# Patient Record
Sex: Female | Born: 1965
Health system: Southern US, Community
[De-identification: ages and names within clinical notes are randomized; demographics above are authoritative.]

## PROBLEM LIST (undated history)

## (undated) DIAGNOSIS — M2550 Pain in unspecified joint: Secondary | ICD-10-CM

## (undated) DIAGNOSIS — H547 Unspecified visual loss: Secondary | ICD-10-CM

## (undated) DIAGNOSIS — I10 Essential (primary) hypertension: Secondary | ICD-10-CM

## (undated) DIAGNOSIS — H3581 Retinal edema: Secondary | ICD-10-CM

## (undated) DIAGNOSIS — F32A Depression, unspecified: Secondary | ICD-10-CM

## (undated) DIAGNOSIS — F909 Attention-deficit hyperactivity disorder, unspecified type: Secondary | ICD-10-CM

## (undated) DIAGNOSIS — H409 Unspecified glaucoma: Secondary | ICD-10-CM

## (undated) DIAGNOSIS — E11319 Type 2 diabetes mellitus with unspecified diabetic retinopathy without macular edema: Secondary | ICD-10-CM

## (undated) DIAGNOSIS — E119 Type 2 diabetes mellitus without complications: Secondary | ICD-10-CM

## (undated) DIAGNOSIS — F329 Major depressive disorder, single episode, unspecified: Secondary | ICD-10-CM

## (undated) DIAGNOSIS — E785 Hyperlipidemia, unspecified: Secondary | ICD-10-CM

## (undated) HISTORY — DX: Major depressive disorder, single episode, unspecified: F32.9

## (undated) HISTORY — DX: Hyperlipidemia, unspecified: E78.5

## (undated) HISTORY — DX: Unspecified visual loss: H54.7

## (undated) HISTORY — DX: Pain in unspecified joint: M25.50

## (undated) HISTORY — DX: Type 2 diabetes mellitus without complications: E11.9

## (undated) HISTORY — DX: Retinal edema: H35.81

## (undated) HISTORY — DX: Attention-deficit hyperactivity disorder, unspecified type: F90.9

## (undated) HISTORY — PX: CATARACT EXTRACTION: SUR2

## (undated) HISTORY — DX: Essential (primary) hypertension: I10

## (undated) HISTORY — PX: OTHER SURGICAL HISTORY: SHX169

## (undated) HISTORY — DX: Depression, unspecified: F32.A

## (undated) HISTORY — DX: Unspecified glaucoma: H40.9

## (undated) HISTORY — DX: Type 2 diabetes mellitus with unspecified diabetic retinopathy without macular edema: E11.319

---

## 2006-09-27 ENCOUNTER — Inpatient Hospital Stay (HOSPITAL_COMMUNITY): Admission: AD | Admit: 2006-09-27 | Discharge: 2006-09-27 | Payer: Self-pay | Admitting: Obstetrics and Gynecology

## 2006-10-11 ENCOUNTER — Ambulatory Visit: Payer: Self-pay | Admitting: Gynecology

## 2006-10-11 ENCOUNTER — Inpatient Hospital Stay (HOSPITAL_COMMUNITY): Admission: AD | Admit: 2006-10-11 | Discharge: 2006-10-16 | Payer: Self-pay | Admitting: Gynecology

## 2006-10-11 IMAGING — US US OB COMP +14 WK
1 series · 14 of 28 positions shown · non-contrast
Comparison: none

OBSTETRICAL ULTRASOUND:

 This ultrasound exam was performed in the [HOSPITAL] Ultrasound Department.  The OB US report was generated in the AS system, and faxed to the ordering physician.  This report is also available in [REDACTED] PACS.

[Series 1: us ob comp +14 wk · 28 acquisitions, 14 frames shown]
[im 2/28]
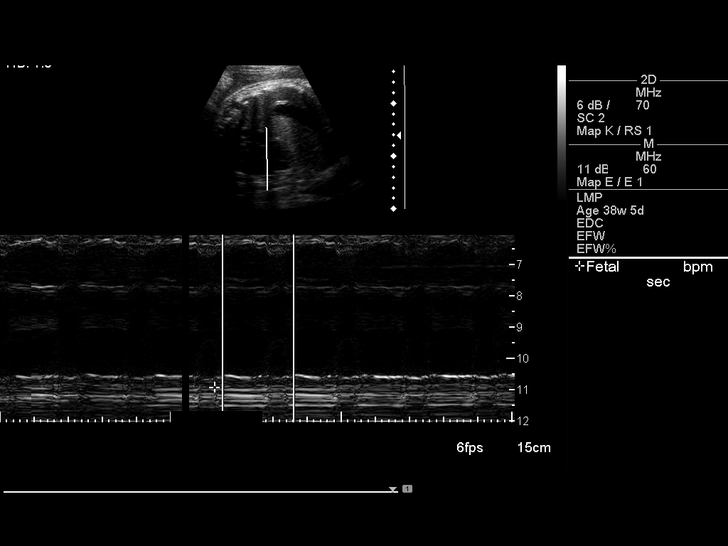
[im 4/28]
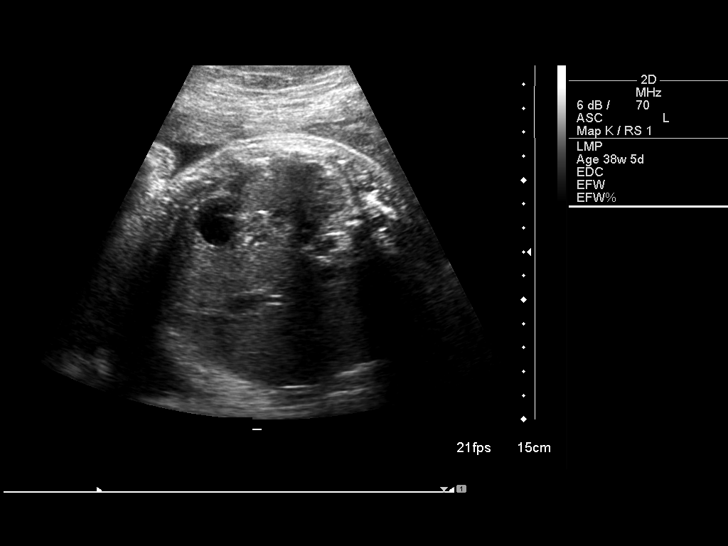
[im 6/28]
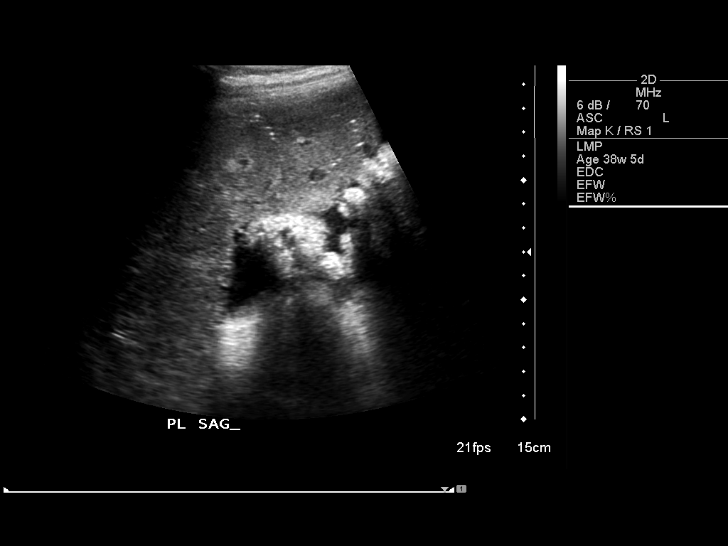
[im 8/28]
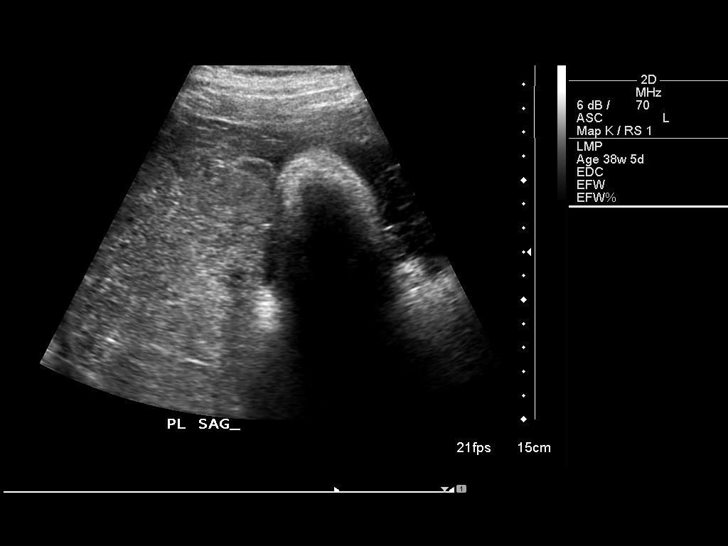
[im 10/28]
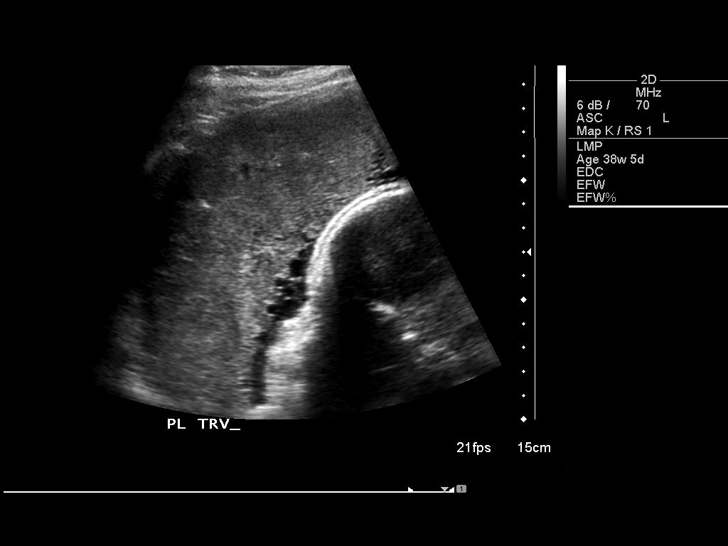
[im 12/28]
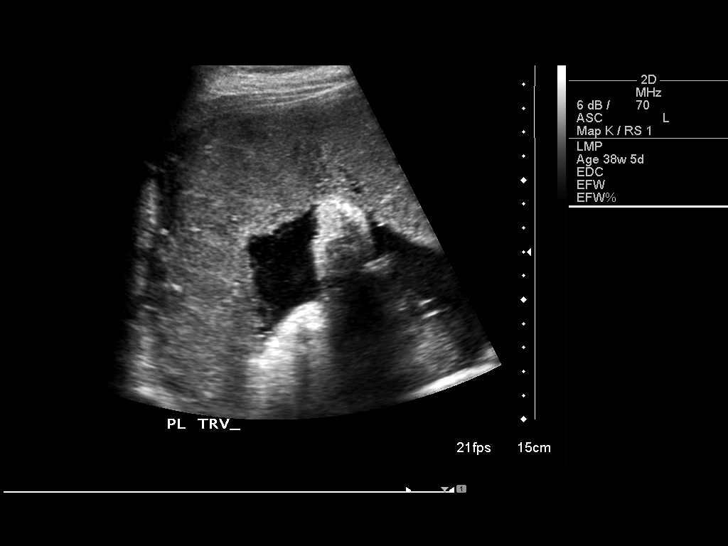
[im 14/28]
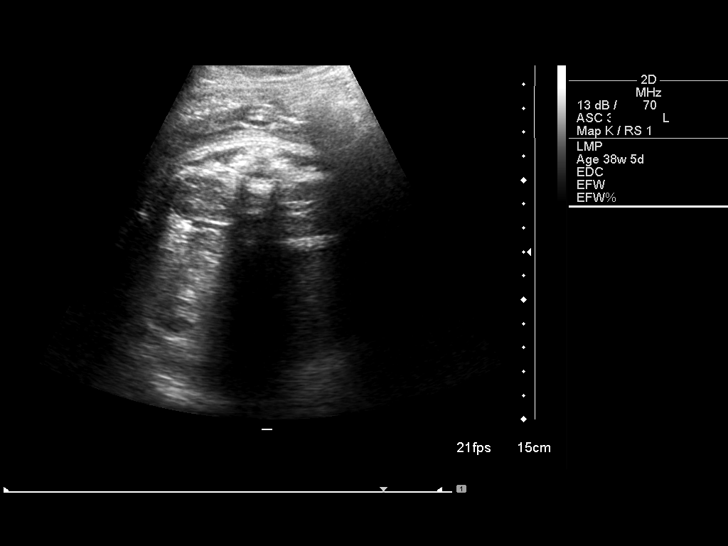
[im 16/28]
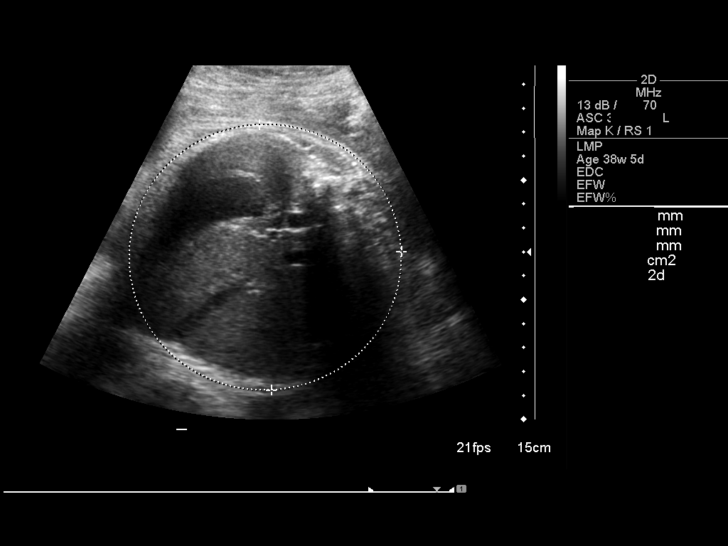
[im 18/28]
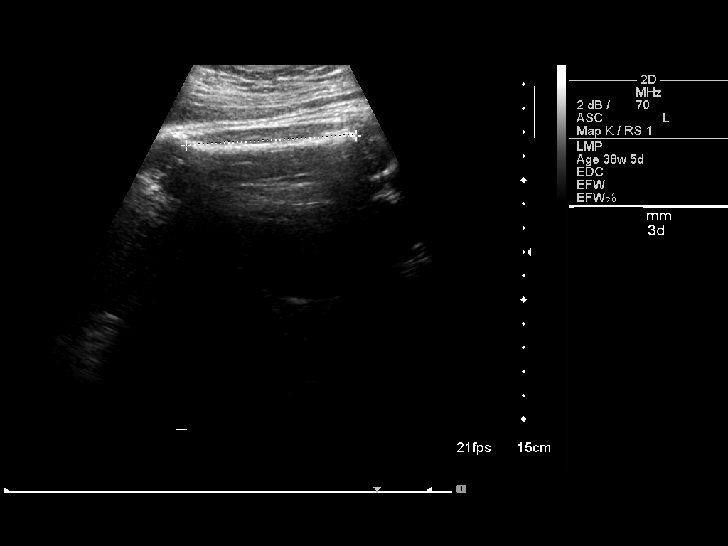
[im 20/28]
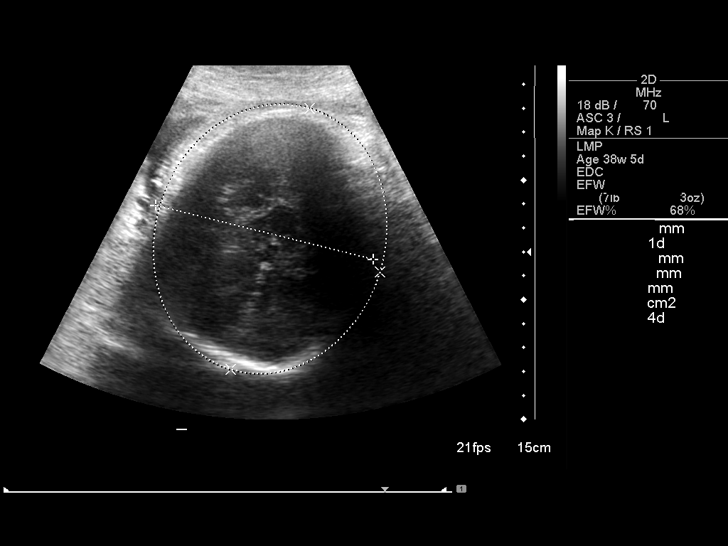
[im 22/28]
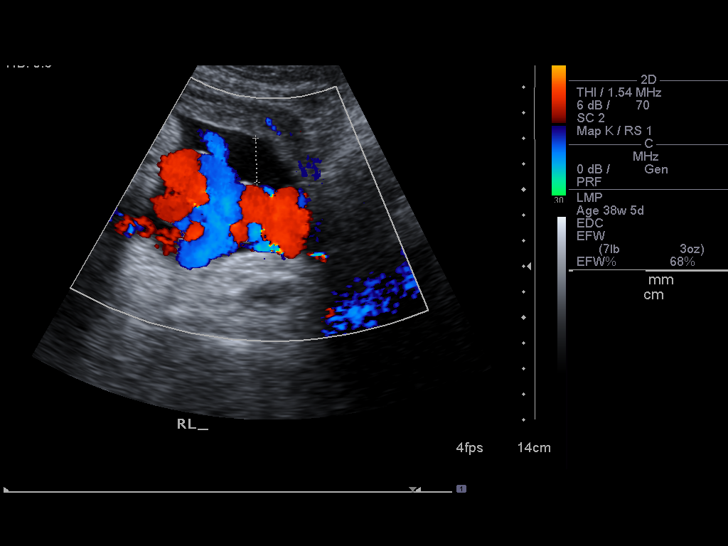
[im 24/28]
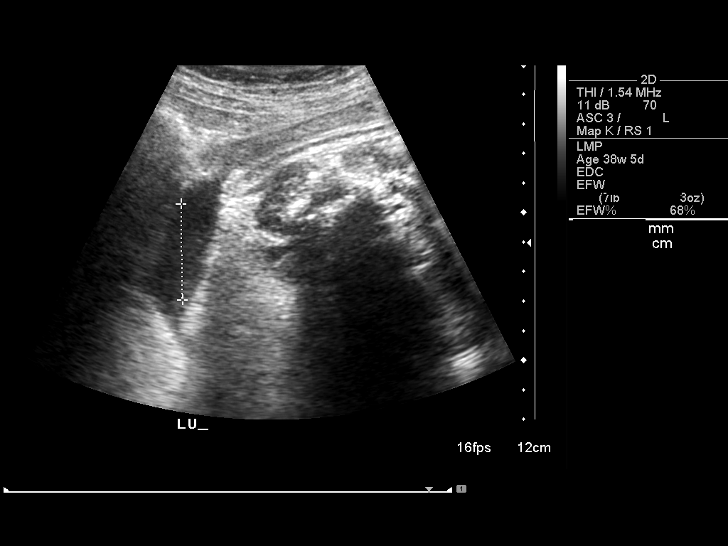
[im 26/28]
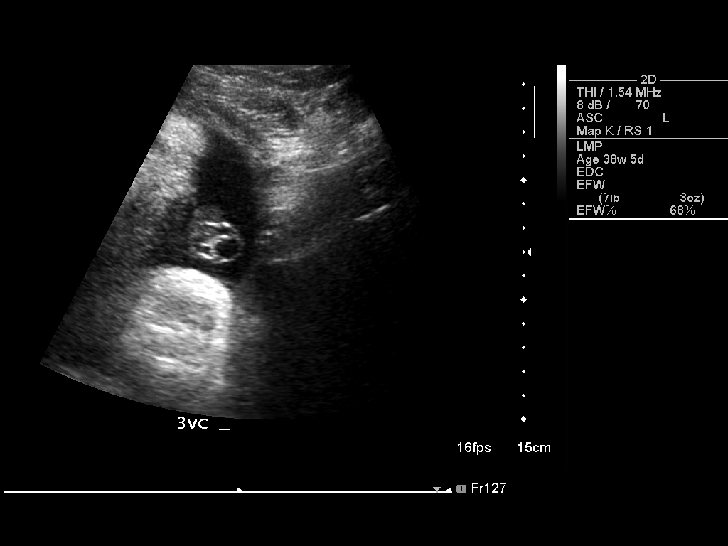
[im 28/28]
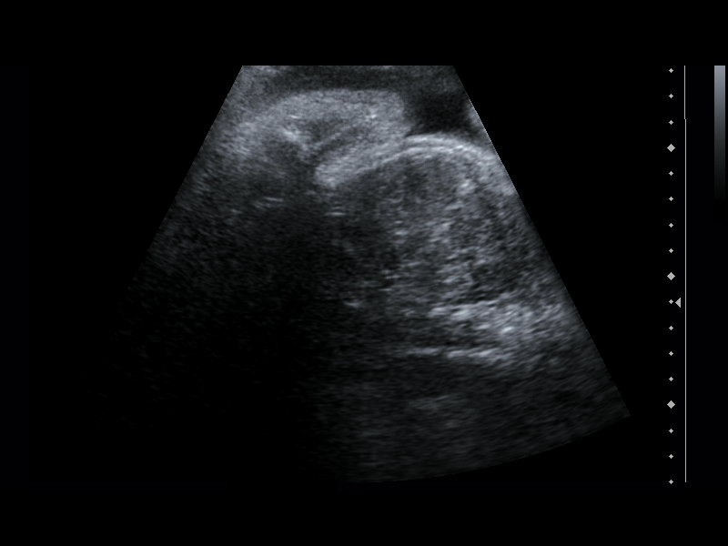

[14 of 28 positions shown; findings below may reference images not displayed]

IMPRESSION: See AS Obstetric US report.

## 2006-10-13 ENCOUNTER — Encounter (INDEPENDENT_AMBULATORY_CARE_PROVIDER_SITE_OTHER): Payer: Self-pay | Admitting: Gynecology

## 2007-01-01 ENCOUNTER — Encounter: Payer: Self-pay | Admitting: Endocrinology

## 2007-01-01 ENCOUNTER — Ambulatory Visit: Payer: Self-pay | Admitting: Endocrinology

## 2007-01-01 DIAGNOSIS — I739 Peripheral vascular disease, unspecified: Secondary | ICD-10-CM

## 2007-01-01 DIAGNOSIS — I10 Essential (primary) hypertension: Secondary | ICD-10-CM | POA: Insufficient documentation

## 2007-01-01 DIAGNOSIS — E785 Hyperlipidemia, unspecified: Secondary | ICD-10-CM | POA: Insufficient documentation

## 2007-01-01 DIAGNOSIS — E119 Type 2 diabetes mellitus without complications: Secondary | ICD-10-CM | POA: Insufficient documentation

## 2007-01-19 ENCOUNTER — Encounter: Payer: Self-pay | Admitting: Endocrinology

## 2010-08-10 NOTE — Op Note (Signed)
[name], [name]           ACCOUNT NO.:  [id number]   MEDICAL RECORD NO.:  [id number]          PATIENT TYPE:  INP   LOCATION:  NA                            FACILITY:  [facility]   PHYSICIAN:  Ginger Carne, MD  DATE OF BIRTH:  July 01, 1965   DATE OF PROCEDURE:  10/13/2006  DATE OF DISCHARGE:                               OPERATIVE REPORT   PREOPERATIVE DIAGNOSIS:  Pre-gestational diabetic [redacted] weeks gestation,  frank breech presentation, and sterilization.   POSTOPERATIVE DIAGNOSIS:  Pre-gestational diabetic [redacted] weeks gestation,  frank breech presentation, and sterilization, with term viable delivery  of female infant.   PROCEDURE:  Primary low transverse cesarean section and Pomeroy  bilateral tubal ligation.   SURGEON:  Ginger Carne, M.D.   ASSISTANT:  None.   COMPLICATIONS:  None immediate.   ESTIMATED BLOOD LOSS:  600 mL.   SPECIMEN:  Portions of the right and left tube to pathology including  placenta.   FINDINGS:  Term infant female in a frank breech presentation.  Apgar and  weight per delivery room record. No gross abnormalities.  The baby cried  spontaneously at delivery.  Amniotic fluid was clear.  Three vessel cord  central insertion and placenta complete.  Uterus, tubes and ovaries  showed normal decidual changes of pregnancy.   OPERATIVE PROCEDURE:  The patient was prepped and draped in the usual  fashion and placed in the left lateral supine position.  Betadine  solution was used for antiseptic and the patient was catheterized prior  to the procedure.  After adequate spinal analgesia, a Pfannenstiel  incision was made and the abdomen opened, the bladder flap dissected,  and the lower uterine segment incised transversely.  The baby was  delivered, cord clamped and cut, and the infant given to the pediatric  staff after bulb suctioning and a partially assisted frank delivery was  performed.  The placenta was removed manually.  The uterus was  inspected.   Closure of the uterine musculature in one layer of 0 Vicryl  running interlocking suture.  Bleeding points hemostatically checked.  Blood clots removed from the abdomen.   Pomeroy bilateral tubal ligation was performed by grasping the tubes at  the isthmus ampullary junction. 3 cm of tube were incorporated into 2-0  plain catgut suture ties affixed to the tubes.  Ties were performed  twice. Above the knot, the tubes were cut, tips cauterized, and specimen  sent separately to pathology.  There was no active bleeding at  the end of the procedure.  Closure of the fascia with double loop 0 PDS  running suture and skin staples for the skin.  The instrument and sponge  count were correct.  The patient tolerated the procedure well and  returned to the post anesthesia recovery room in excellent condition.      Ginger Carne, MD  Electronically Signed     SHB/MEDQ  D:  10/13/2006  T:  10/13/2006  Job:  [id number]

## 2010-08-10 NOTE — Discharge Summary (Signed)
[name], [name]           ACCOUNT NO.:  [id number]   MEDICAL RECORD NO.:  [id number]          PATIENT TYPE:  INP   LOCATION:  9132                          FACILITY:  [facility]   PHYSICIAN:  Jill Carne, MD  DATE OF BIRTH:  November 28, 1965   DATE OF ADMISSION:  10/11/2006  DATE OF DISCHARGE:  10/16/2006                               DISCHARGE SUMMARY   REASON FOR ADMISSION:  1. Primary low transverse cesarean section.  2. Breech presentation.  3. Blood glucose control.   COURSE AND TREATMENT RENDERED:  Jill [name] is a 45 year old G1, P0,  who was admitted at 39 weeks for capillary blood glucose management  prior to scheduled primary low transverse cesarean section for breech  presentation.  The patient did well, and blood glucoses were controlled  prior to cesarean section on October 13, 2006.  The patient delivered a  viable female, Apgars 7 and 9 at one and five minutes, respectively.  Weight 8 pounds 5 ounces.  Length 20-1/2 inches.  There was a true knot  found in the cord.  Three-vessel cord placenta was manually removed  thereafter.  Estimated blood loss for the procedure was 600 mL.  Procedure was performed by Dr. Blima Rich.  After cesarean section,  the patient had a postpartum tubal.  She did well postpartum but was  found to have low hemoglobin and hematocrit and was symptomatic and thus  was transfused 3 units packed red blood cells.  Status post packed red  blood cells, hemoglobin and hematocrit compensated appropriately, and  the patient became less symptomatic.  Blood sugars have been well  controlled after delivery.   FINAL DIAGNOSIS:  A 45 year old G1, P1 status post primary low  transverse C-section for breech presentation with diabetes mellitus type  2 orally controlled.   CONDITION ON DISCHARGE:  Well.   INSTRUCTIONS:   ACTIVITY:  1. No heavy lifting x6 weeks.  2. No sexual activity x6 weeks.   DIET:  Diabetic.   MEDICATIONS:  1. Percocet  5/325, 1-2 tabs q.4-6h. p.r.n. pain.  2. Motrin 600 mg 1 tab p.o. q.6h. p.r.n. pain.  3. Prenatal vitamins 1 tab p.o. daily.  4. Iron sulfate 325 mg 1 tab p.o. daily.  5. Lexapro 20 mg 1 tab p.o. daily.  6. Lantus insulin 35 units subcu q.h.s.  7. Regular insulin 8 units subcu with each meal.  8. Colace 100 mg p.o. b.i.d. p.r.n. constipation.   The patient is to be discharged home with followup in 6 weeks at the  [facility] for routine postpartum followup.  It has also  been requested that she follow up as soon as possible with her primary  care physician in order to get established with an endocrinologist for  her diabetes control.      Jill Boozer, MD      Jill Carne, MD  Electronically Signed    SA/MEDQ  D:  10/16/2006  T:  10/16/2006  Job:  [id number]

## 2011-01-10 LAB — CROSSMATCH: ABO/RH(D): A POS

## 2011-01-10 LAB — CBC
HCT: 16.7 — ABNORMAL LOW
HCT: 25.4 — ABNORMAL LOW
Hemoglobin: 8.6 — ABNORMAL LOW
Hemoglobin: 9.4 — ABNORMAL LOW
MCHC: 33.1
MCHC: 33.2
MCHC: 33.5
MCHC: 34
MCV: 77.1 — ABNORMAL LOW
MCV: 77.1 — ABNORMAL LOW
MCV: 80.7
Platelets: 336
Platelets: 395
Platelets: 415 — ABNORMAL HIGH
RBC: 2.52 — ABNORMAL LOW
RBC: 3.15 — ABNORMAL LOW
RBC: 3.7 — ABNORMAL LOW
RDW: 16.5 — ABNORMAL HIGH
RDW: 17.4 — ABNORMAL HIGH
RDW: 17.8 — ABNORMAL HIGH
WBC: 12.3 — ABNORMAL HIGH
WBC: 16.1 — ABNORMAL HIGH
WBC: 18.6 — ABNORMAL HIGH

## 2011-01-10 LAB — URINALYSIS, ROUTINE W REFLEX MICROSCOPIC
Ketones, ur: NEGATIVE
Leukocytes, UA: NEGATIVE
Nitrite: NEGATIVE
Protein, ur: 100 — AB
Urobilinogen, UA: 1
pH: 6

## 2011-01-10 LAB — COMPREHENSIVE METABOLIC PANEL
ALT: 12
AST: 18
AST: 21
Albumin: 1.9 — ABNORMAL LOW
Alkaline Phosphatase: 162 — ABNORMAL HIGH
BUN: 9
CO2: 22
Calcium: 8.7
Calcium: 8.8
Chloride: 107
Creatinine, Ser: 0.7
GFR calc Af Amer: >60
GFR calc Af Amer: >60
GFR calc non Af Amer: >60
Glucose, Bld: 104 — ABNORMAL HIGH
Potassium: 3.9
Sodium: 135
Total Bilirubin: 0.5

## 2011-01-10 LAB — PROTEIN, URINE, 24 HOUR
Collection Interval-UPROT: 24
Protein, Urine: 29

## 2011-01-10 LAB — RUBELLA SCREEN: Rubella: 33.7 — ABNORMAL HIGH

## 2011-01-10 LAB — LACTATE DEHYDROGENASE
LDH: 149
LDH: 154

## 2011-01-10 LAB — DIFFERENTIAL
Eosinophils Relative: 0
Lymphocytes Relative: 18
Lymphs Abs: 2
Monocytes Absolute: 0.6
Neutro Abs: 8 — ABNORMAL HIGH

## 2011-01-10 LAB — RPR
RPR Ser Ql: NONREACTIVE
RPR Ser Ql: NONREACTIVE

## 2011-01-10 LAB — HEMOGLOBIN A1C: Hgb A1c MFr Bld: 7.3 — ABNORMAL HIGH

## 2011-01-10 LAB — HEPATITIS B SURFACE ANTIGEN: Hepatitis B Surface Ag: NEGATIVE

## 2011-01-11 LAB — URINALYSIS, ROUTINE W REFLEX MICROSCOPIC
Bilirubin Urine: NEGATIVE
Glucose, UA: NEGATIVE
Hgb urine dipstick: NEGATIVE
Ketones, ur: NEGATIVE
Leukocytes, UA: NEGATIVE
pH: 5.5

## 2011-01-11 LAB — URINE MICROSCOPIC-ADD ON

## 2016-11-11 DIAGNOSIS — I10 Essential (primary) hypertension: Secondary | ICD-10-CM | POA: Diagnosis not present

## 2016-11-11 DIAGNOSIS — Z9841 Cataract extraction status, right eye: Secondary | ICD-10-CM | POA: Diagnosis not present

## 2016-11-11 DIAGNOSIS — H543 Unqualified visual loss, both eyes: Secondary | ICD-10-CM | POA: Diagnosis not present

## 2016-11-11 DIAGNOSIS — E113513 Type 2 diabetes mellitus with proliferative diabetic retinopathy with macular edema, bilateral: Secondary | ICD-10-CM | POA: Diagnosis not present

## 2016-11-11 DIAGNOSIS — Z794 Long term (current) use of insulin: Secondary | ICD-10-CM | POA: Diagnosis not present

## 2016-11-11 DIAGNOSIS — H269 Unspecified cataract: Secondary | ICD-10-CM | POA: Diagnosis not present

## 2016-11-11 DIAGNOSIS — H40039 Anatomical narrow angle, unspecified eye: Secondary | ICD-10-CM | POA: Diagnosis not present

## 2016-11-11 DIAGNOSIS — H40052 Ocular hypertension, left eye: Secondary | ICD-10-CM | POA: Diagnosis not present

## 2016-11-11 DIAGNOSIS — H04123 Dry eye syndrome of bilateral lacrimal glands: Secondary | ICD-10-CM | POA: Diagnosis not present

## 2016-11-11 DIAGNOSIS — Z961 Presence of intraocular lens: Secondary | ICD-10-CM | POA: Diagnosis not present

## 2016-11-17 DIAGNOSIS — Z9889 Other specified postprocedural states: Secondary | ICD-10-CM | POA: Diagnosis not present

## 2016-11-17 DIAGNOSIS — Z794 Long term (current) use of insulin: Secondary | ICD-10-CM | POA: Diagnosis not present

## 2016-11-17 DIAGNOSIS — H40039 Anatomical narrow angle, unspecified eye: Secondary | ICD-10-CM | POA: Diagnosis not present

## 2016-11-17 DIAGNOSIS — E113513 Type 2 diabetes mellitus with proliferative diabetic retinopathy with macular edema, bilateral: Secondary | ICD-10-CM | POA: Diagnosis not present

## 2016-11-17 DIAGNOSIS — H04123 Dry eye syndrome of bilateral lacrimal glands: Secondary | ICD-10-CM | POA: Diagnosis not present

## 2016-11-17 DIAGNOSIS — Z961 Presence of intraocular lens: Secondary | ICD-10-CM | POA: Diagnosis not present

## 2016-11-17 DIAGNOSIS — H543 Unqualified visual loss, both eyes: Secondary | ICD-10-CM | POA: Diagnosis not present

## 2016-11-17 DIAGNOSIS — H269 Unspecified cataract: Secondary | ICD-10-CM | POA: Diagnosis not present

## 2016-11-17 DIAGNOSIS — H40052 Ocular hypertension, left eye: Secondary | ICD-10-CM | POA: Diagnosis not present

## 2016-11-18 DIAGNOSIS — H53131 Sudden visual loss, right eye: Secondary | ICD-10-CM | POA: Diagnosis not present

## 2016-11-19 DIAGNOSIS — E113513 Type 2 diabetes mellitus with proliferative diabetic retinopathy with macular edema, bilateral: Secondary | ICD-10-CM | POA: Diagnosis not present

## 2016-11-19 DIAGNOSIS — Z961 Presence of intraocular lens: Secondary | ICD-10-CM | POA: Diagnosis not present

## 2016-11-19 DIAGNOSIS — H53131 Sudden visual loss, right eye: Secondary | ICD-10-CM | POA: Diagnosis not present

## 2016-11-21 DIAGNOSIS — Z961 Presence of intraocular lens: Secondary | ICD-10-CM | POA: Diagnosis not present

## 2016-11-21 DIAGNOSIS — Z794 Long term (current) use of insulin: Secondary | ICD-10-CM | POA: Diagnosis not present

## 2016-11-21 DIAGNOSIS — H53131 Sudden visual loss, right eye: Secondary | ICD-10-CM | POA: Diagnosis not present

## 2016-11-21 DIAGNOSIS — H40052 Ocular hypertension, left eye: Secondary | ICD-10-CM | POA: Diagnosis not present

## 2016-11-21 DIAGNOSIS — H40039 Anatomical narrow angle, unspecified eye: Secondary | ICD-10-CM | POA: Diagnosis not present

## 2016-11-21 DIAGNOSIS — H269 Unspecified cataract: Secondary | ICD-10-CM | POA: Diagnosis not present

## 2016-11-21 DIAGNOSIS — H04123 Dry eye syndrome of bilateral lacrimal glands: Secondary | ICD-10-CM | POA: Diagnosis not present

## 2016-11-21 DIAGNOSIS — Z9841 Cataract extraction status, right eye: Secondary | ICD-10-CM | POA: Diagnosis not present

## 2016-11-21 DIAGNOSIS — H543 Unqualified visual loss, both eyes: Secondary | ICD-10-CM | POA: Diagnosis not present

## 2016-11-21 DIAGNOSIS — Z9889 Other specified postprocedural states: Secondary | ICD-10-CM | POA: Diagnosis not present

## 2016-11-21 DIAGNOSIS — E113513 Type 2 diabetes mellitus with proliferative diabetic retinopathy with macular edema, bilateral: Secondary | ICD-10-CM | POA: Diagnosis not present

## 2016-11-23 DIAGNOSIS — H53131 Sudden visual loss, right eye: Secondary | ICD-10-CM | POA: Diagnosis not present

## 2016-11-23 DIAGNOSIS — E113513 Type 2 diabetes mellitus with proliferative diabetic retinopathy with macular edema, bilateral: Secondary | ICD-10-CM | POA: Diagnosis not present

## 2016-11-23 DIAGNOSIS — Z794 Long term (current) use of insulin: Secondary | ICD-10-CM | POA: Diagnosis not present

## 2016-11-23 DIAGNOSIS — Z961 Presence of intraocular lens: Secondary | ICD-10-CM | POA: Diagnosis not present

## 2016-11-23 DIAGNOSIS — Z9889 Other specified postprocedural states: Secondary | ICD-10-CM | POA: Diagnosis not present

## 2016-11-23 DIAGNOSIS — H04123 Dry eye syndrome of bilateral lacrimal glands: Secondary | ICD-10-CM | POA: Diagnosis not present

## 2016-11-23 DIAGNOSIS — H40039 Anatomical narrow angle, unspecified eye: Secondary | ICD-10-CM | POA: Diagnosis not present

## 2016-11-23 DIAGNOSIS — H40052 Ocular hypertension, left eye: Secondary | ICD-10-CM | POA: Diagnosis not present

## 2016-11-23 DIAGNOSIS — H543 Unqualified visual loss, both eyes: Secondary | ICD-10-CM | POA: Diagnosis not present

## 2016-11-23 DIAGNOSIS — H269 Unspecified cataract: Secondary | ICD-10-CM | POA: Diagnosis not present

## 2016-11-27 DIAGNOSIS — H538 Other visual disturbances: Secondary | ICD-10-CM | POA: Diagnosis not present

## 2016-12-12 DIAGNOSIS — Z794 Long term (current) use of insulin: Secondary | ICD-10-CM | POA: Diagnosis not present

## 2016-12-12 DIAGNOSIS — Z961 Presence of intraocular lens: Secondary | ICD-10-CM | POA: Diagnosis not present

## 2016-12-12 DIAGNOSIS — H53131 Sudden visual loss, right eye: Secondary | ICD-10-CM | POA: Diagnosis not present

## 2016-12-12 DIAGNOSIS — H40039 Anatomical narrow angle, unspecified eye: Secondary | ICD-10-CM | POA: Diagnosis not present

## 2016-12-12 DIAGNOSIS — Z9841 Cataract extraction status, right eye: Secondary | ICD-10-CM | POA: Diagnosis not present

## 2016-12-12 DIAGNOSIS — H40052 Ocular hypertension, left eye: Secondary | ICD-10-CM | POA: Diagnosis not present

## 2016-12-12 DIAGNOSIS — E113513 Type 2 diabetes mellitus with proliferative diabetic retinopathy with macular edema, bilateral: Secondary | ICD-10-CM | POA: Diagnosis not present

## 2016-12-12 DIAGNOSIS — Z9889 Other specified postprocedural states: Secondary | ICD-10-CM | POA: Diagnosis not present

## 2016-12-12 DIAGNOSIS — H04123 Dry eye syndrome of bilateral lacrimal glands: Secondary | ICD-10-CM | POA: Diagnosis not present

## 2016-12-13 DIAGNOSIS — E109 Type 1 diabetes mellitus without complications: Secondary | ICD-10-CM | POA: Diagnosis not present

## 2016-12-13 DIAGNOSIS — Z6833 Body mass index (BMI) 33.0-33.9, adult: Secondary | ICD-10-CM | POA: Diagnosis not present

## 2016-12-13 DIAGNOSIS — I10 Essential (primary) hypertension: Secondary | ICD-10-CM | POA: Diagnosis not present

## 2016-12-13 DIAGNOSIS — F988 Other specified behavioral and emotional disorders with onset usually occurring in childhood and adolescence: Secondary | ICD-10-CM | POA: Diagnosis not present

## 2016-12-13 DIAGNOSIS — Z79899 Other long term (current) drug therapy: Secondary | ICD-10-CM | POA: Diagnosis not present

## 2016-12-13 DIAGNOSIS — E669 Obesity, unspecified: Secondary | ICD-10-CM | POA: Diagnosis not present

## 2016-12-13 DIAGNOSIS — F419 Anxiety disorder, unspecified: Secondary | ICD-10-CM | POA: Diagnosis not present

## 2016-12-27 DIAGNOSIS — Z1231 Encounter for screening mammogram for malignant neoplasm of breast: Secondary | ICD-10-CM | POA: Diagnosis not present

## 2017-02-06 DIAGNOSIS — Z794 Long term (current) use of insulin: Secondary | ICD-10-CM | POA: Diagnosis not present

## 2017-02-06 DIAGNOSIS — H268 Other specified cataract: Secondary | ICD-10-CM | POA: Diagnosis not present

## 2017-02-06 DIAGNOSIS — E113513 Type 2 diabetes mellitus with proliferative diabetic retinopathy with macular edema, bilateral: Secondary | ICD-10-CM | POA: Diagnosis not present

## 2017-02-13 DIAGNOSIS — Z23 Encounter for immunization: Secondary | ICD-10-CM | POA: Diagnosis not present

## 2017-02-14 ENCOUNTER — Ambulatory Visit (INDEPENDENT_AMBULATORY_CARE_PROVIDER_SITE_OTHER): Payer: Medicare Other

## 2017-02-14 ENCOUNTER — Ambulatory Visit (INDEPENDENT_AMBULATORY_CARE_PROVIDER_SITE_OTHER): Payer: Medicare Other | Admitting: Podiatry

## 2017-02-14 ENCOUNTER — Encounter: Payer: Self-pay | Admitting: Podiatry

## 2017-02-14 VITALS — BP 128/75 | HR 93 | Ht 60.0 in | Wt 190.0 lb

## 2017-02-14 DIAGNOSIS — M19072 Primary osteoarthritis, left ankle and foot: Secondary | ICD-10-CM | POA: Diagnosis not present

## 2017-02-14 DIAGNOSIS — S93325S Dislocation of tarsometatarsal joint of left foot, sequela: Secondary | ICD-10-CM

## 2017-02-14 DIAGNOSIS — M779 Enthesopathy, unspecified: Secondary | ICD-10-CM

## 2017-02-14 NOTE — Patient Instructions (Signed)
Pre-Operative Instructions  Congratulations, you have decided to take an important step towards improving your quality of life.  You can be assured that the doctors and staff at [facility] will be with you every step of the way.  Here are some important things you should know:  1. Plan to be at the surgery center/hospital at least 1 (one) hour prior to your scheduled time, unless otherwise directed by the surgical center/hospital staff.  You must have a responsible adult accompany you, remain during the surgery and drive you home.  Make sure you have directions to the surgical center/hospital to ensure you arrive on time. 2. If you are having surgery at [facility] or [facility] hospitals, you will need a copy of your medical history and physical form from your family physician within one month prior to the date of surgery. We will give you a form for your primary physician to complete.  3. We make every effort to accommodate the date you request for surgery.  However, there are times where surgery dates or times have to be moved.  We will contact you as soon as possible if a change in schedule is required.   4. No aspirin/ibuprofen for one week before surgery.  If you are on aspirin, any non-steroidal anti-inflammatory medications (Mobic, Aleve, Ibuprofen) should not be taken seven (7) days prior to your surgery.  You make take Tylenol for pain prior to surgery.  5. Medications - If you are taking daily heart and blood pressure medications, seizure, reflux, allergy, asthma, anxiety, pain or diabetes medications, make sure you notify the surgery center/hospital before the day of surgery so they can tell you which medications you should take or avoid the day of surgery. 6. No food or drink after midnight the night before surgery unless directed otherwise by surgical center/hospital staff. 7. No alcoholic beverages 24-hours prior to surgery.  No smoking 24-hours prior or 24-hours after  surgery. 8. Wear loose pants or shorts. They should be loose enough to fit over bandages, boots, and casts. 9. Don't wear slip-on shoes. Sneakers are preferred. 10. Bring your boot with you to the surgery center/hospital.  Also bring crutches or a walker if your physician has prescribed it for you.  If you do not have this equipment, it will be provided for you after surgery. 11. If you have not been contacted by the surgery center/hospital by the day before your surgery, call to confirm the date and time of your surgery. 12. Leave-time from work may vary depending on the type of surgery you have.  Appropriate arrangements should be made prior to surgery with your employer. 13. Prescriptions will be provided immediately following surgery by your doctor.  Fill these as soon as possible after surgery and take the medication as directed. Pain medications will not be refilled on weekends and must be approved by the doctor. 14. Remove nail polish on the operative foot and avoid getting pedicures prior to surgery. 15. Wash the night before surgery.  The night before surgery wash the foot and leg well with water and the antibacterial soap provided. Be sure to pay special attention to beneath the toenails and in between the toes.  Wash for at least three (3) minutes. Rinse thoroughly with water and dry well with a towel.  Perform this wash unless told not to do so by your physician.  Enclosed: 1 Ice pack (please put in freezer the night before surgery)   1 Hibiclens skin cleaner     Pre-op instructions  If you have any questions regarding the instructions, please do not hesitate to call our office.  [place]: [address], [place], [place] [address] -- [phone]  [place]: [address] [place], [place] [address] -- [phone]  [place]: [address]  , [place] [address] -- [phone]  [place]: [address], [address], [place], [place] [address] -- [phone]  Website:  [email or web address] 

## 2017-02-19 NOTE — Progress Notes (Signed)
Subjective:  Patient ID: Jill [name], female    DOB: [birth year],  MRN: [id number]  Chief Complaint  Patient presents with  . Foot Pain    broke left foot in 2015 and it did not heal properly still having discomfort and difficulty finding shoes     51 y.o. female presents with the above complaint.  States that she broke her left foot in 2015.  Was told not to walk on however she did.  Injury was falling off the step.  Reports pain from the area and difficulty in shoe gear.  No past medical history on file.  Current Outpatient Medications:  .  acetaminophen (TYLENOL) 325 MG tablet, Take 650 mg by mouth., Disp: , Rfl:  .  amphetamine-dextroamphetamine (ADDERALL XR) 10 MG 24 hr capsule, Take 10 mg by mouth., Disp: , Rfl:  .  buPROPion (WELLBUTRIN XL) 300 MG 24 hr tablet, Take 300 mg by mouth., Disp: , Rfl:  .  dorzolamide-timolol (COSOPT) 22.3-6.8 MG/ML ophthalmic solution, Place 1 drop into both eyes 2 times daily., Disp: , Rfl:  .  fluticasone (FLONASE) 50 MCG/ACT nasal spray, Place into both nostrils daily., Disp: , Rfl:  .  insulin aspart (NOVOLOG) cartridge, Inject into the skin., Disp: , Rfl:  .  lisinopril-hydrochlorothiazide (PRINZIDE,ZESTORETIC) 20-12.5 MG tablet, Take 1 tablet by mouth daily., Disp: , Rfl:  .  loratadine (CLARITIN) 10 MG tablet, Take 10 mg by mouth., Disp: , Rfl:  .  metFORMIN (GLUCOPHAGE-XR) 500 MG 24 hr tablet, TAKE ONE TABLET BY MOUTH ONCE DAILY FOR 7 DAYS. IF TOLERATE THIS OKAY MOVE UP TO ONE TABLET EVERY 12 HOURS, Disp: , Rfl:  .  polyvinyl alcohol (LIQUIFILM TEARS) 1.4 % ophthalmic solution, Place 1 drop into both eyes 2 times daily., Disp: , Rfl:   Allergies  Allergen Reactions  . Canagliflozin Rash  . Latex Rash    Blistery rash  . Pb-Hyoscy-Atropine-Scopol Er Rash  . Sulfamethoxazole Itching   Review of Systems all other systems reviewed and are negative denies nausea vomiting fever chills chest pain shortness of breath Objective:   Vitals:    02/14/17 1517  BP: 128/75  Pulse: 93   General AA&O x3. Normal mood and affect.  Vascular Dorsalis pedis and posterior tibial pulses  present 2+ bilaterally  Capillary refill normal to all digits. Pedal hair growth normal.  Neurologic Epicritic sensation grossly present.  Dermatologic No open lesions. Interspaces clear of maceration. Nails well groomed and normal in appearance.  Orthopedic: MMT 5/5 in dorsiflexion, plantarflexion, inversion, and eversion. Normal joint ROM without pain or crepitus. Pain on palpation left first tarsometatarsal joint with pain on attempted range of motion with crepitus. No pain on palpation or range of motion second tarsometatarsal joint   Radiographs taken and reviewed: First tarsometatarsal dorsolateral subluxation with second tarsometatarsal degenerative changes  Assessment & Plan:  Patient was evaluated and treated and all questions answered.  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 -Patient presently has no pain at the second metatarsal base and the majority of her pain is at the first metatarsal head and first metatarsal base.  Advised that while it does not hurt her now she may end up with arthritis in the future.  Discussed that we could go ahead and fuse the second tarsal metatarsal joint however as it is not actively painful patient I agree to monitor at this time.  Patient may need surgery for  this issue in the future. -All risk benefits alternatives of surgery explained.  No guarantees given.  Postoperative course explained -Patient to proceed with left Lapidus fusion. Date to be determined by surgery scheduler  Follow-up postoperatively

## 2017-02-28 ENCOUNTER — Other Ambulatory Visit: Payer: Self-pay | Admitting: Podiatry

## 2017-02-28 DIAGNOSIS — S93325S Dislocation of tarsometatarsal joint of left foot, sequela: Secondary | ICD-10-CM

## 2017-03-10 DIAGNOSIS — B958 Unspecified staphylococcus as the cause of diseases classified elsewhere: Secondary | ICD-10-CM | POA: Diagnosis not present

## 2017-03-13 DIAGNOSIS — Z6833 Body mass index (BMI) 33.0-33.9, adult: Secondary | ICD-10-CM | POA: Diagnosis not present

## 2017-03-13 DIAGNOSIS — E109 Type 1 diabetes mellitus without complications: Secondary | ICD-10-CM | POA: Diagnosis not present

## 2017-03-13 DIAGNOSIS — E669 Obesity, unspecified: Secondary | ICD-10-CM | POA: Diagnosis not present

## 2017-03-13 DIAGNOSIS — I10 Essential (primary) hypertension: Secondary | ICD-10-CM | POA: Diagnosis not present

## 2017-03-13 DIAGNOSIS — F988 Other specified behavioral and emotional disorders with onset usually occurring in childhood and adolescence: Secondary | ICD-10-CM | POA: Diagnosis not present

## 2017-03-13 DIAGNOSIS — F419 Anxiety disorder, unspecified: Secondary | ICD-10-CM | POA: Diagnosis not present

## 2017-03-13 DIAGNOSIS — E782 Mixed hyperlipidemia: Secondary | ICD-10-CM | POA: Diagnosis not present

## 2017-03-13 DIAGNOSIS — H548 Legal blindness, as defined in USA: Secondary | ICD-10-CM | POA: Diagnosis not present

## 2017-03-15 DIAGNOSIS — L281 Prurigo nodularis: Secondary | ICD-10-CM | POA: Diagnosis not present

## 2017-03-15 DIAGNOSIS — L309 Dermatitis, unspecified: Secondary | ICD-10-CM | POA: Diagnosis not present

## 2017-04-05 DIAGNOSIS — E161 Other hypoglycemia: Secondary | ICD-10-CM | POA: Diagnosis not present

## 2017-04-05 DIAGNOSIS — R112 Nausea with vomiting, unspecified: Secondary | ICD-10-CM | POA: Diagnosis not present

## 2017-04-05 DIAGNOSIS — E162 Hypoglycemia, unspecified: Secondary | ICD-10-CM | POA: Diagnosis not present

## 2017-04-05 DIAGNOSIS — E11649 Type 2 diabetes mellitus with hypoglycemia without coma: Secondary | ICD-10-CM | POA: Diagnosis not present

## 2017-04-05 DIAGNOSIS — Z794 Long term (current) use of insulin: Secondary | ICD-10-CM | POA: Diagnosis not present

## 2017-04-07 DIAGNOSIS — E10319 Type 1 diabetes mellitus with unspecified diabetic retinopathy without macular edema: Secondary | ICD-10-CM | POA: Diagnosis not present

## 2017-04-07 DIAGNOSIS — Z6834 Body mass index (BMI) 34.0-34.9, adult: Secondary | ICD-10-CM | POA: Diagnosis not present

## 2017-04-07 DIAGNOSIS — M25549 Pain in joints of unspecified hand: Secondary | ICD-10-CM | POA: Diagnosis not present

## 2017-04-07 DIAGNOSIS — R5383 Other fatigue: Secondary | ICD-10-CM | POA: Diagnosis not present

## 2017-04-07 DIAGNOSIS — E782 Mixed hyperlipidemia: Secondary | ICD-10-CM | POA: Diagnosis not present

## 2017-04-07 DIAGNOSIS — Z79899 Other long term (current) drug therapy: Secondary | ICD-10-CM | POA: Diagnosis not present

## 2017-04-07 DIAGNOSIS — E669 Obesity, unspecified: Secondary | ICD-10-CM | POA: Diagnosis not present

## 2017-04-24 ENCOUNTER — Telehealth: Payer: Self-pay | Admitting: *Deleted

## 2017-04-24 NOTE — Telephone Encounter (Signed)
"  I saw Dr. March Rummage back in November.  He said I needed to have surgery.  So, I want to set that up.  I guess I'll have to do it in March.  Does he have anything available on March 20?"  That date is available.  I'll get it scheduled.  "What time do I need to be there?"  Someone from the surgical center will call you with your arrival time a day or two prior to your surgery date.

## 2017-05-08 DIAGNOSIS — J0100 Acute maxillary sinusitis, unspecified: Secondary | ICD-10-CM | POA: Diagnosis not present

## 2017-05-19 DIAGNOSIS — Z882 Allergy status to sulfonamides status: Secondary | ICD-10-CM | POA: Diagnosis not present

## 2017-05-19 DIAGNOSIS — H16423 Pannus (corneal), bilateral: Secondary | ICD-10-CM | POA: Diagnosis not present

## 2017-05-19 DIAGNOSIS — Z91040 Latex allergy status: Secondary | ICD-10-CM | POA: Diagnosis not present

## 2017-05-19 DIAGNOSIS — Z961 Presence of intraocular lens: Secondary | ICD-10-CM | POA: Diagnosis not present

## 2017-05-19 DIAGNOSIS — H04123 Dry eye syndrome of bilateral lacrimal glands: Secondary | ICD-10-CM | POA: Diagnosis not present

## 2017-05-19 DIAGNOSIS — H40052 Ocular hypertension, left eye: Secondary | ICD-10-CM | POA: Diagnosis not present

## 2017-05-19 DIAGNOSIS — H268 Other specified cataract: Secondary | ICD-10-CM | POA: Diagnosis not present

## 2017-05-19 DIAGNOSIS — H2512 Age-related nuclear cataract, left eye: Secondary | ICD-10-CM | POA: Diagnosis not present

## 2017-05-19 DIAGNOSIS — E113513 Type 2 diabetes mellitus with proliferative diabetic retinopathy with macular edema, bilateral: Secondary | ICD-10-CM | POA: Diagnosis not present

## 2017-05-19 DIAGNOSIS — Z888 Allergy status to other drugs, medicaments and biological substances status: Secondary | ICD-10-CM | POA: Diagnosis not present

## 2017-06-12 DIAGNOSIS — F419 Anxiety disorder, unspecified: Secondary | ICD-10-CM | POA: Diagnosis not present

## 2017-06-12 DIAGNOSIS — M109 Gout, unspecified: Secondary | ICD-10-CM | POA: Diagnosis not present

## 2017-06-12 DIAGNOSIS — F988 Other specified behavioral and emotional disorders with onset usually occurring in childhood and adolescence: Secondary | ICD-10-CM | POA: Diagnosis not present

## 2017-06-12 DIAGNOSIS — I10 Essential (primary) hypertension: Secondary | ICD-10-CM | POA: Diagnosis not present

## 2017-06-12 DIAGNOSIS — E782 Mixed hyperlipidemia: Secondary | ICD-10-CM | POA: Diagnosis not present

## 2017-06-12 DIAGNOSIS — E10319 Type 1 diabetes mellitus with unspecified diabetic retinopathy without macular edema: Secondary | ICD-10-CM | POA: Diagnosis not present

## 2017-06-12 DIAGNOSIS — Z1331 Encounter for screening for depression: Secondary | ICD-10-CM | POA: Diagnosis not present

## 2017-06-12 DIAGNOSIS — E669 Obesity, unspecified: Secondary | ICD-10-CM | POA: Diagnosis not present

## 2017-06-12 DIAGNOSIS — Z6834 Body mass index (BMI) 34.0-34.9, adult: Secondary | ICD-10-CM | POA: Diagnosis not present

## 2017-06-14 ENCOUNTER — Other Ambulatory Visit: Payer: Self-pay | Admitting: Podiatry

## 2017-06-14 ENCOUNTER — Encounter: Payer: Self-pay | Admitting: Podiatry

## 2017-06-14 ENCOUNTER — Telehealth: Payer: Self-pay | Admitting: *Deleted

## 2017-06-14 ENCOUNTER — Telehealth: Payer: Self-pay | Admitting: Podiatry

## 2017-06-14 DIAGNOSIS — M25572 Pain in left ankle and joints of left foot: Secondary | ICD-10-CM | POA: Diagnosis not present

## 2017-06-14 DIAGNOSIS — E7800 Pure hypercholesterolemia, unspecified: Secondary | ICD-10-CM | POA: Diagnosis not present

## 2017-06-14 DIAGNOSIS — Y929 Unspecified place or not applicable: Secondary | ICD-10-CM | POA: Diagnosis not present

## 2017-06-14 DIAGNOSIS — M25375 Other instability, left foot: Secondary | ICD-10-CM | POA: Diagnosis not present

## 2017-06-14 DIAGNOSIS — S93325S Dislocation of tarsometatarsal joint of left foot, sequela: Secondary | ICD-10-CM

## 2017-06-14 DIAGNOSIS — M13872 Other specified arthritis, left ankle and foot: Secondary | ICD-10-CM | POA: Diagnosis not present

## 2017-06-14 DIAGNOSIS — Y999 Unspecified external cause status: Secondary | ICD-10-CM | POA: Diagnosis not present

## 2017-06-14 DIAGNOSIS — M25372 Other instability, left ankle: Secondary | ICD-10-CM | POA: Diagnosis not present

## 2017-06-14 DIAGNOSIS — X58XXXS Exposure to other specified factors, sequela: Secondary | ICD-10-CM | POA: Diagnosis not present

## 2017-06-14 DIAGNOSIS — Y939 Activity, unspecified: Secondary | ICD-10-CM | POA: Diagnosis not present

## 2017-06-14 MED ORDER — OXYCODONE-ACETAMINOPHEN 10-325 MG PO TABS
1.0000 | ORAL_TABLET | ORAL | 0 refills | Status: DC | PRN
Start: 2017-06-14 — End: 2018-01-16

## 2017-06-14 MED ORDER — CEPHALEXIN 500 MG PO CAPS: 500.0000 mg | ORAL_CAPSULE | Freq: Two times a day (BID) | ORAL | 0 refills | Status: DC

## 2017-06-14 MED ORDER — ONDANSETRON HCL 4 MG PO TABS: 4.0000 mg | ORAL_TABLET | Freq: Three times a day (TID) | ORAL | 0 refills | Status: DC | PRN

## 2017-06-14 NOTE — Progress Notes (Signed)
Patient seen today for surgery. Rx sent to patient's pharmacy.

## 2017-06-14 NOTE — Telephone Encounter (Signed)
Sam - [facility] Port[facility] states can only dispense percocet 10/325 #20 one every 8 hours to be within the 7mEq of morphine. I okayed the change to Percocet 10/325 #20 one tablet every 8 hours.

## 2017-06-14 NOTE — Telephone Encounter (Signed)
I'm calling for my wife Jill [name] who had surgery today. Prescriptions were supposed to be sent into the pharmacy, [facility] in [place] and they have not heard anything. Please give me a call back at [phone].

## 2017-06-15 NOTE — Telephone Encounter (Signed)
I spoke with pt's husband, Gerald Stabs and asked if he had been able to get pt's pain medication and he said he had, I asked if pt had been able to rest and he said she was doing fine if he could get her to sit down. I told Gerald Stabs to tell pt sit down and she should not be up on that surgery foot more than 15 minutes per hour, rest, ice and elevate, keep the boot on. Gerald Stabs gave pt the information.

## 2017-06-16 ENCOUNTER — Telehealth: Payer: Self-pay | Admitting: *Deleted

## 2017-06-16 NOTE — Telephone Encounter (Signed)
Called and left a message for patient to call me back, I was calling to check on the patient after she had surgery on Wednesday with Dr March Rummage. Lattie Haw

## 2017-06-16 NOTE — Telephone Encounter (Signed)
Patient called back and left a message and I called at 4:10 pm and the voice mail came on again and I just stated that I was calling to see how patient was doing and to call on Monday at 8 am. Jill [name]

## 2017-06-18 ENCOUNTER — Telehealth: Payer: Self-pay | Admitting: Podiatry

## 2017-06-18 NOTE — Telephone Encounter (Signed)
Patient called stating that she had a fever of 101.  She states that she feels somewhat warm but overall she feels fine.  She also states that she has a thermometer that she does not know she calibrated this correctly.  She denies any chills, nausea or vomiting she denies any pain or shortness of breath.  I discussed with her this may be just due to post anesthesia however I want her to take Tylenol to get the fever down and have it rechecked.  If it does not go down or gets any higher to call us back or go to the emergency room.  Otherwise she states that she feels well from the surgery.  Any changes she is encouraged to call back.  Trula Slade

## 2017-06-19 ENCOUNTER — Ambulatory Visit (INDEPENDENT_AMBULATORY_CARE_PROVIDER_SITE_OTHER): Payer: Medicare Other

## 2017-06-19 ENCOUNTER — Ambulatory Visit (INDEPENDENT_AMBULATORY_CARE_PROVIDER_SITE_OTHER): Payer: Medicare Other | Admitting: Podiatry

## 2017-06-19 DIAGNOSIS — Z9889 Other specified postprocedural states: Secondary | ICD-10-CM

## 2017-06-19 DIAGNOSIS — S93325S Dislocation of tarsometatarsal joint of left foot, sequela: Secondary | ICD-10-CM | POA: Diagnosis not present

## 2017-06-19 MED ORDER — TRAMADOL HCL 50 MG PO TABS: 50.0000 mg | ORAL_TABLET | Freq: Four times a day (QID) | ORAL | 0 refills | Status: DC | PRN

## 2017-06-19 NOTE — Progress Notes (Signed)
  Subjective:  Patient ID: Jill [name], female    DOB: 1965/04/12,  MRN: [id number]  Chief Complaint  Patient presents with  . Routine Post Op    Pov#1 dos 03.20.2019 1st Metatarso-Cuneiform Fusion Lt   Pt. stated," constant-dull pain; 6/10, but overall it's doing fine." Tx: advil     DOS: 06/14/17 Procedure: L First metatarsal cuneiform fusion  52 y.o. female returns for post-op check. Denies N/V/F/Ch.  Did not like taking the pain medicine she was given due to side effects.  Requested from prescription today  Objective:   General AA&O x3. Normal mood and affect.  Vascular Foot warm and well perfused.  Neurologic Gross sensation intact.  Dermatologic Skin healing well without signs of infection. Skin edges well coapted without signs of infection.  Orthopedic: Tenderness to palpation noted about the surgical site.    Assessment & Plan:  Patient was evaluated and treated and all questions answered.  S/p left first tarsometatarsal fusion -X-rays taken and reviewed.  Stable fixation.  Good positioning of the first tarsometatarsal arthrodesis site.  No evidence of hardware failure.  Retained K wire.  Patient again told about the residual broken K wire and verbalized understanding. -Progressing as expected post-opera next tively. -Sutures: Left intact. -Medications refilled: Tramadol -Foot redressed.  Return in about 1 week (around 06/26/2017) for Post-op.

## 2017-06-20 ENCOUNTER — Encounter: Payer: Medicare Other | Admitting: Podiatry

## 2017-06-20 ENCOUNTER — Other Ambulatory Visit: Payer: Self-pay | Admitting: Podiatry

## 2017-06-20 DIAGNOSIS — Z9889 Other specified postprocedural states: Secondary | ICD-10-CM

## 2017-06-20 DIAGNOSIS — S93325S Dislocation of tarsometatarsal joint of left foot, sequela: Secondary | ICD-10-CM

## 2017-06-21 ENCOUNTER — Ambulatory Visit (INDEPENDENT_AMBULATORY_CARE_PROVIDER_SITE_OTHER): Payer: Self-pay | Admitting: Podiatry

## 2017-06-21 ENCOUNTER — Ambulatory Visit (INDEPENDENT_AMBULATORY_CARE_PROVIDER_SITE_OTHER): Payer: Medicare Other

## 2017-06-21 DIAGNOSIS — W19XXXA Unspecified fall, initial encounter: Secondary | ICD-10-CM

## 2017-06-21 DIAGNOSIS — S93325S Dislocation of tarsometatarsal joint of left foot, sequela: Secondary | ICD-10-CM

## 2017-06-21 NOTE — Progress Notes (Signed)
Subjective: Jill [name] is a 52 y.o. is seen today in office s/p left 1st metatarsal cuneiform fusion preformed on 06/14/2017. She presents today as she called last night stating that she was not wearing her foot and she fell. I asked her to come in today. She states that it has been more sore today and she complains of pain to her toes. Denies any systemic complaints such as fevers, chills, nausea, vomiting. No calf pain, chest pain, shortness of breath.   Objective: General: No acute distress, AAOx3  DP/PT pulses palpable 2/4, CRT < 3 sec to all digits.  Protective sensation intact. Motor function intact.  LEFT foot: Incision is well coapted without any evidence of dehiscence and sutures re intact. There is no surrounding erythema, ascending cellulitis, fluctuance, crepitus, malodor, drainage/purulence. There is mild edema around the surgical site. There is mild pain along the surgical site.  There is tenderness to the hallux and along the medial band of the plantar fascia in the arch of the foot. No other areas of pinpoint tenderness. There is no swelling to the toe or the arch of the foot. No pain to the achilles tendon.  No other areas of tenderness to bilateral lower extremities.  No other open lesions or pre-ulcerative lesions.  No pain with calf compression, swelling, warmth, erythema.   Assessment and Plan:  Status post left foot surgery, presents today s/p fall.   -Treatment options discussed including all alternatives, risks, and complications -X-rays were obtained and reviewed with the patient. Hardware intact. There is no evidence of acute fracture or stress fracture today.  -Antibiotic ointment and a bandage was applied followed by a DSD. Keep dressing clean, dry, intact.  -Ice/elevation -Pain medication as needed. -Monitor for any clinical signs or symptoms of infection and DVT/PE and directed to call the office immediately should any occur or go to the ER. -Follow-up as  schedule with Dr. March Rummage or sooner if any problems arise. In the meantime, encouraged to call the office with any questions, concerns, change in symptoms.   Celesta Gentile, DPM

## 2017-06-27 ENCOUNTER — Encounter: Payer: Medicare Other | Admitting: Podiatry

## 2017-06-28 ENCOUNTER — Ambulatory Visit (INDEPENDENT_AMBULATORY_CARE_PROVIDER_SITE_OTHER): Payer: Medicare Other | Admitting: Sports Medicine

## 2017-06-28 ENCOUNTER — Encounter: Payer: Self-pay | Admitting: Sports Medicine

## 2017-06-28 DIAGNOSIS — Z9889 Other specified postprocedural states: Secondary | ICD-10-CM

## 2017-06-28 DIAGNOSIS — S93325S Dislocation of tarsometatarsal joint of left foot, sequela: Secondary | ICD-10-CM

## 2017-06-28 MED ORDER — TRAMADOL HCL 50 MG PO TABS: 50.0000 mg | ORAL_TABLET | Freq: Four times a day (QID) | ORAL | 0 refills | Status: DC | PRN

## 2017-06-28 NOTE — Progress Notes (Addendum)
Subjective: Jill [name] is a 52 y.o. female patient seen today in office for POV #2 (DOS 06-14-17), S/P met- cuneiform fusion left foot performed by Dr. March Rummage. Patient denies current pain at surgical site, denies calf pain, denies headache, chest pain, shortness of breath, nausea, vomiting, fever, or chills. Patient states that she is doing good and has not needed any pain medicine over the last 1-2 days.  Admits to a little tingling that is not painful but otherwise no other issues noted.   There are no active problems to display for this patient.   Current Outpatient Medications on File Prior to Visit  Medication Sig Dispense Refill  . acetaminophen (TYLENOL) 325 MG tablet Take 650 mg by mouth.    Marland Kitchen amphetamine-dextroamphetamine (ADDERALL XR) 10 MG 24 hr capsule Take 10 mg by mouth.    Marland Kitchen buPROPion (WELLBUTRIN XL) 300 MG 24 hr tablet Take 300 mg by mouth.    . cephALEXin (KEFLEX) 500 MG capsule Take 1 capsule (500 mg total) by mouth 2 (two) times daily. 14 capsule 0  . dorzolamide-timolol (COSOPT) 22.3-6.8 MG/ML ophthalmic solution Place 1 drop into both eyes 2 times daily.    . fluticasone (FLONASE) 50 MCG/ACT nasal spray Place into both nostrils daily.    . insulin aspart (NOVOLOG) cartridge Inject into the skin.    Marland Kitchen lisinopril-hydrochlorothiazide (PRINZIDE,ZESTORETIC) 20-12.5 MG tablet Take 1 tablet by mouth daily.    Marland Kitchen loratadine (CLARITIN) 10 MG tablet Take 10 mg by mouth.    . metFORMIN (GLUCOPHAGE-XR) 500 MG 24 hr tablet TAKE ONE TABLET BY MOUTH ONCE DAILY FOR 7 DAYS. IF TOLERATE THIS OKAY MOVE UP TO ONE TABLET EVERY 12 HOURS    . ondansetron (ZOFRAN) 4 MG tablet Take 1 tablet (4 mg total) by mouth every 8 (eight) hours as needed for nausea or vomiting. 20 tablet 0  . oxyCODONE-acetaminophen (PERCOCET) 10-325 MG tablet Take 1 tablet by mouth every 4 (four) hours as needed for pain. 20 tablet 0  . polyvinyl alcohol (LIQUIFILM TEARS) 1.4 % ophthalmic solution Place 1 drop into both  eyes 2 times daily.     No current facility-administered medications on file prior to visit.     Allergies  Allergen Reactions  . Canagliflozin Rash  . Latex Rash    Blistery rash  . Pb-Hyoscy-Atropine-Scopolamine Rash  . Sulfamethoxazole Itching    Objective: There were no vitals filed for this visit.  General: No acute distress, AAOx3  Left foot: Sutures intact with no gapping or dehiscence at surgical site, mild ecchymosis at first interspace, mild swelling to left forefoot, no erythema, no warmth, no drainage, no signs of infection noted, Capillary fill time <3 seconds in all digits, gross sensation present via light touch to left foot. No pain with calf compression.    Assessment and Plan:  Problem List Items Addressed This Visit    None    Visit Diagnoses    Post-operative state    -  Primary   Relevant Medications   traMADol (ULTRAM) 50 MG tablet   Lisfranc dislocation, left, sequela       Relevant Medications   traMADol (ULTRAM) 50 MG tablet      -Patient seen and evaluated -Applied dry sterile dressing to surgical site left foot secured with ACE wrap and stockinet  -Advised patient to make sure to keep dressings clean, dry, and intact to left surgical site, readjusting the ACE as needed  -Advised patient to continue with nonweightbearing and use of knee scooter  and cam boot on left foot -Advised patient to limit activity to necessity  -Advised patient to ice and elevate as necessary  -Refilled tramadol to take as needed for moderate to severe pain -Will plan for patient to follow up with Dr. March Rummage for continued postoperative care at next office visit. In the meantime, patient to call office if any issues or problems arise.   Landis Martins, DPM

## 2017-07-03 DIAGNOSIS — H04123 Dry eye syndrome of bilateral lacrimal glands: Secondary | ICD-10-CM | POA: Diagnosis not present

## 2017-07-03 DIAGNOSIS — Z794 Long term (current) use of insulin: Secondary | ICD-10-CM | POA: Diagnosis not present

## 2017-07-03 DIAGNOSIS — Z961 Presence of intraocular lens: Secondary | ICD-10-CM | POA: Diagnosis not present

## 2017-07-03 DIAGNOSIS — H268 Other specified cataract: Secondary | ICD-10-CM | POA: Diagnosis not present

## 2017-07-03 DIAGNOSIS — E113513 Type 2 diabetes mellitus with proliferative diabetic retinopathy with macular edema, bilateral: Secondary | ICD-10-CM | POA: Diagnosis not present

## 2017-07-04 ENCOUNTER — Ambulatory Visit (INDEPENDENT_AMBULATORY_CARE_PROVIDER_SITE_OTHER): Payer: Medicare Other | Admitting: Podiatry

## 2017-07-04 DIAGNOSIS — Z9889 Other specified postprocedural states: Secondary | ICD-10-CM

## 2017-07-04 NOTE — Progress Notes (Signed)
  Subjective:  Patient ID: Elin Fenley, female    DOB: [birth year],  MRN: [id number]  Chief Complaint  Patient presents with  . Routine Post Op    ODS 06/14/17 S/P met-cuneiform fusion Lt foot Pt. stated," it's doing fine, no pain just a little tender." Tx: none    DOS: 06/14/17 Procedure: L First metatarsal cuneiform fusion  52 y.o. female returns for post-op check. Denies N/V/F/Ch.  States that the foot is doing fine.  No pain just a little tender.    Objective:   General AA&O x3. Normal mood and affect.  Vascular Foot warm and well perfused.  Neurologic Gross sensation intact.  Dermatologic Skin healing well without signs of infection. Skin edges well coapted without signs of infection.  Orthopedic: Tenderness to palpation noted about the surgical site.    Assessment & Plan:  Patient was evaluated and treated and all questions answered.  S/p left first tarsometatarsal fusion -Progressing as expected post-opera next tively. -Sutures: dissolvable. Advised at this point is ok to shower but not soak.  Discussed maintaining nonweightbearing while she showers. -Medications refilled: none -Compression sleeve applied  F/u in 2 weeks with new XRs. Will possibly progress weightbearing at that time if enough consolidation is noted  No follow-ups on file.

## 2017-07-11 DIAGNOSIS — Z9889 Other specified postprocedural states: Secondary | ICD-10-CM | POA: Diagnosis not present

## 2017-07-11 DIAGNOSIS — H40053 Ocular hypertension, bilateral: Secondary | ICD-10-CM | POA: Diagnosis not present

## 2017-07-11 DIAGNOSIS — H2512 Age-related nuclear cataract, left eye: Secondary | ICD-10-CM | POA: Diagnosis not present

## 2017-07-11 DIAGNOSIS — E113513 Type 2 diabetes mellitus with proliferative diabetic retinopathy with macular edema, bilateral: Secondary | ICD-10-CM | POA: Diagnosis not present

## 2017-07-11 DIAGNOSIS — H04123 Dry eye syndrome of bilateral lacrimal glands: Secondary | ICD-10-CM | POA: Diagnosis not present

## 2017-07-11 DIAGNOSIS — Z794 Long term (current) use of insulin: Secondary | ICD-10-CM | POA: Diagnosis not present

## 2017-07-11 DIAGNOSIS — H268 Other specified cataract: Secondary | ICD-10-CM | POA: Diagnosis not present

## 2017-07-18 ENCOUNTER — Ambulatory Visit (INDEPENDENT_AMBULATORY_CARE_PROVIDER_SITE_OTHER): Payer: Medicare Other | Admitting: Podiatry

## 2017-07-18 ENCOUNTER — Ambulatory Visit (INDEPENDENT_AMBULATORY_CARE_PROVIDER_SITE_OTHER): Payer: Medicare Other

## 2017-07-18 DIAGNOSIS — M13872 Other specified arthritis, left ankle and foot: Secondary | ICD-10-CM

## 2017-07-18 DIAGNOSIS — Z9889 Other specified postprocedural states: Secondary | ICD-10-CM

## 2017-07-24 NOTE — Progress Notes (Signed)
  Subjective:  Patient ID: Livie Vanderhoof, female    DOB: Mar 18, 1966,  MRN: [id number]  Chief Complaint  Patient presents with  . Routine Post Op    Pt. stated," foot is doing much better, just with minor pain at night."    DOS: 06/14/17 Procedure: L First metatarsal cuneiform fusion  52 y.o. female returns for post-op check. Denies N/V/F/Ch. Only has minor pain at night.   Objective:   General AA&O x3. Normal mood and affect.  Vascular Foot warm and well perfused.  Neurologic Gross sensation intact.  Dermatologic Skin well healed.  Orthopedic: No tenderness to palpation noted about the surgical site.   Assessment & Plan:  Patient was evaluated and treated and all questions answered.  S/p left first tarsometatarsal fusion -Progressing as expected post-opera next tively. -XR taken and reviewed.  None of consolidation to allow for further weightbearing.  Follow-up in 2 weeks for new x-rays. -Continue nonweightbearing with knee scooter.  Return in about 2 weeks (around 08/01/2017) for Post-op.

## 2017-08-01 ENCOUNTER — Encounter: Payer: Medicare Other | Admitting: Podiatry

## 2017-08-02 DIAGNOSIS — Z136 Encounter for screening for cardiovascular disorders: Secondary | ICD-10-CM | POA: Diagnosis not present

## 2017-08-02 DIAGNOSIS — E669 Obesity, unspecified: Secondary | ICD-10-CM | POA: Diagnosis not present

## 2017-08-02 DIAGNOSIS — Z1211 Encounter for screening for malignant neoplasm of colon: Secondary | ICD-10-CM | POA: Diagnosis not present

## 2017-08-02 DIAGNOSIS — Z0000 Encounter for general adult medical examination without abnormal findings: Secondary | ICD-10-CM | POA: Diagnosis not present

## 2017-08-02 DIAGNOSIS — Z6835 Body mass index (BMI) 35.0-35.9, adult: Secondary | ICD-10-CM | POA: Diagnosis not present

## 2017-08-03 DIAGNOSIS — E1136 Type 2 diabetes mellitus with diabetic cataract: Secondary | ICD-10-CM | POA: Diagnosis not present

## 2017-08-03 DIAGNOSIS — H543 Unqualified visual loss, both eyes: Secondary | ICD-10-CM | POA: Diagnosis not present

## 2017-08-03 DIAGNOSIS — H04123 Dry eye syndrome of bilateral lacrimal glands: Secondary | ICD-10-CM | POA: Diagnosis not present

## 2017-08-03 DIAGNOSIS — Z9889 Other specified postprocedural states: Secondary | ICD-10-CM | POA: Diagnosis not present

## 2017-08-03 DIAGNOSIS — Z888 Allergy status to other drugs, medicaments and biological substances status: Secondary | ICD-10-CM | POA: Diagnosis not present

## 2017-08-03 DIAGNOSIS — Z91040 Latex allergy status: Secondary | ICD-10-CM | POA: Diagnosis not present

## 2017-08-03 DIAGNOSIS — Z794 Long term (current) use of insulin: Secondary | ICD-10-CM | POA: Diagnosis not present

## 2017-08-03 DIAGNOSIS — Z961 Presence of intraocular lens: Secondary | ICD-10-CM | POA: Diagnosis not present

## 2017-08-03 DIAGNOSIS — E113553 Type 2 diabetes mellitus with stable proliferative diabetic retinopathy, bilateral: Secondary | ICD-10-CM | POA: Diagnosis not present

## 2017-08-03 DIAGNOSIS — Z882 Allergy status to sulfonamides status: Secondary | ICD-10-CM | POA: Diagnosis not present

## 2017-08-07 ENCOUNTER — Encounter: Payer: Medicare Other | Admitting: Podiatry

## 2017-08-14 ENCOUNTER — Ambulatory Visit (INDEPENDENT_AMBULATORY_CARE_PROVIDER_SITE_OTHER): Payer: Medicare Other | Admitting: Podiatry

## 2017-08-14 ENCOUNTER — Ambulatory Visit (INDEPENDENT_AMBULATORY_CARE_PROVIDER_SITE_OTHER): Payer: Medicare Other

## 2017-08-14 DIAGNOSIS — S93325S Dislocation of tarsometatarsal joint of left foot, sequela: Secondary | ICD-10-CM

## 2017-08-14 MED ORDER — DOXYCYCLINE HYCLATE 100 MG PO TABS: 100.0000 mg | ORAL_TABLET | Freq: Two times a day (BID) | ORAL | 0 refills | Status: DC

## 2017-08-14 NOTE — Progress Notes (Signed)
  Subjective:  Patient ID: Jill [name], female    DOB: [birth year],  MRN: [id number]  Chief Complaint  Patient presents with  . Routine Post Op    Pt. stated," it's doing great, no pain at all. Also, swimming has helped a lot." Tx: boot, and swimming (helping a lot)     DOS: 06/14/17 Procedure: L First metatarsal cuneiform fusion  52 y.o. female returns for post-op check. Denies N/V/F/Ch. Doing well, no pain. Ambulating in boot without issue.  Objective:   General AA&O x3. Normal mood and affect.  Vascular Foot warm and well perfused.  Neurologic Gross sensation intact.  Dermatologic Skin with some areas of incomplete healing and fibrosis.  Orthopedic: No tenderness to palpation noted about the surgical site.   Assessment & Plan:  Patient was evaluated and treated and all questions answered.  S/p left first tarsometatarsal fusion -Progressing as expected post-operatively. -XR taken and reviewed. Incomplete bridging. Follow-up in 2 weeks for new x-rays. -Continue WBAT in CAM boot.  Return in about 2 weeks (around 08/28/2017) for Post-op.

## 2017-08-28 ENCOUNTER — Encounter: Payer: Medicare Other | Admitting: Podiatry

## 2017-08-29 DIAGNOSIS — J069 Acute upper respiratory infection, unspecified: Secondary | ICD-10-CM | POA: Diagnosis not present

## 2017-08-29 DIAGNOSIS — H66009 Acute suppurative otitis media without spontaneous rupture of ear drum, unspecified ear: Secondary | ICD-10-CM | POA: Diagnosis not present

## 2017-08-29 DIAGNOSIS — H6123 Impacted cerumen, bilateral: Secondary | ICD-10-CM | POA: Diagnosis not present

## 2017-09-04 DIAGNOSIS — H0102B Squamous blepharitis left eye, upper and lower eyelids: Secondary | ICD-10-CM | POA: Diagnosis not present

## 2017-09-04 DIAGNOSIS — H25812 Combined forms of age-related cataract, left eye: Secondary | ICD-10-CM | POA: Diagnosis not present

## 2017-09-04 DIAGNOSIS — H4312 Vitreous hemorrhage, left eye: Secondary | ICD-10-CM | POA: Diagnosis not present

## 2017-09-04 DIAGNOSIS — H3581 Retinal edema: Secondary | ICD-10-CM | POA: Diagnosis not present

## 2017-09-04 DIAGNOSIS — H40053 Ocular hypertension, bilateral: Secondary | ICD-10-CM | POA: Diagnosis not present

## 2017-09-04 DIAGNOSIS — H0102A Squamous blepharitis right eye, upper and lower eyelids: Secondary | ICD-10-CM | POA: Diagnosis not present

## 2017-09-04 DIAGNOSIS — H04123 Dry eye syndrome of bilateral lacrimal glands: Secondary | ICD-10-CM | POA: Diagnosis not present

## 2017-09-04 DIAGNOSIS — Z961 Presence of intraocular lens: Secondary | ICD-10-CM | POA: Diagnosis not present

## 2017-09-04 DIAGNOSIS — Z79899 Other long term (current) drug therapy: Secondary | ICD-10-CM | POA: Diagnosis not present

## 2017-09-13 DIAGNOSIS — E10319 Type 1 diabetes mellitus with unspecified diabetic retinopathy without macular edema: Secondary | ICD-10-CM | POA: Diagnosis not present

## 2017-09-13 DIAGNOSIS — Z79899 Other long term (current) drug therapy: Secondary | ICD-10-CM | POA: Diagnosis not present

## 2017-09-13 DIAGNOSIS — M109 Gout, unspecified: Secondary | ICD-10-CM | POA: Diagnosis not present

## 2017-09-13 DIAGNOSIS — F419 Anxiety disorder, unspecified: Secondary | ICD-10-CM | POA: Diagnosis not present

## 2017-09-13 DIAGNOSIS — I10 Essential (primary) hypertension: Secondary | ICD-10-CM | POA: Diagnosis not present

## 2017-09-13 DIAGNOSIS — F988 Other specified behavioral and emotional disorders with onset usually occurring in childhood and adolescence: Secondary | ICD-10-CM | POA: Diagnosis not present

## 2017-09-13 DIAGNOSIS — E669 Obesity, unspecified: Secondary | ICD-10-CM | POA: Diagnosis not present

## 2017-09-13 DIAGNOSIS — Z1339 Encounter for screening examination for other mental health and behavioral disorders: Secondary | ICD-10-CM | POA: Diagnosis not present

## 2017-09-13 DIAGNOSIS — Z1331 Encounter for screening for depression: Secondary | ICD-10-CM | POA: Diagnosis not present

## 2017-09-16 DIAGNOSIS — D473 Essential (hemorrhagic) thrombocythemia: Secondary | ICD-10-CM | POA: Diagnosis not present

## 2017-09-16 DIAGNOSIS — Z79899 Other long term (current) drug therapy: Secondary | ICD-10-CM | POA: Diagnosis not present

## 2017-09-16 DIAGNOSIS — D72829 Elevated white blood cell count, unspecified: Secondary | ICD-10-CM | POA: Diagnosis not present

## 2017-09-16 DIAGNOSIS — N179 Acute kidney failure, unspecified: Secondary | ICD-10-CM | POA: Diagnosis not present

## 2017-09-16 DIAGNOSIS — R200 Anesthesia of skin: Secondary | ICD-10-CM | POA: Diagnosis not present

## 2017-09-16 DIAGNOSIS — D638 Anemia in other chronic diseases classified elsewhere: Secondary | ICD-10-CM | POA: Diagnosis not present

## 2017-09-16 DIAGNOSIS — S299XXA Unspecified injury of thorax, initial encounter: Secondary | ICD-10-CM | POA: Diagnosis not present

## 2017-09-16 DIAGNOSIS — I2510 Atherosclerotic heart disease of native coronary artery without angina pectoris: Secondary | ICD-10-CM | POA: Diagnosis not present

## 2017-09-16 DIAGNOSIS — E119 Type 2 diabetes mellitus without complications: Secondary | ICD-10-CM | POA: Diagnosis not present

## 2017-09-16 DIAGNOSIS — Z794 Long term (current) use of insulin: Secondary | ICD-10-CM | POA: Diagnosis not present

## 2017-09-16 DIAGNOSIS — R531 Weakness: Secondary | ICD-10-CM | POA: Diagnosis not present

## 2017-09-16 DIAGNOSIS — E785 Hyperlipidemia, unspecified: Secondary | ICD-10-CM | POA: Diagnosis not present

## 2017-09-16 DIAGNOSIS — G459 Transient cerebral ischemic attack, unspecified: Secondary | ICD-10-CM | POA: Diagnosis not present

## 2017-09-16 DIAGNOSIS — I10 Essential (primary) hypertension: Secondary | ICD-10-CM | POA: Diagnosis not present

## 2017-09-16 DIAGNOSIS — F418 Other specified anxiety disorders: Secondary | ICD-10-CM | POA: Diagnosis not present

## 2017-09-17 DIAGNOSIS — G459 Transient cerebral ischemic attack, unspecified: Secondary | ICD-10-CM | POA: Diagnosis not present

## 2017-09-17 DIAGNOSIS — I6522 Occlusion and stenosis of left carotid artery: Secondary | ICD-10-CM | POA: Diagnosis not present

## 2017-09-23 DIAGNOSIS — J0100 Acute maxillary sinusitis, unspecified: Secondary | ICD-10-CM | POA: Diagnosis not present

## 2017-09-23 DIAGNOSIS — J209 Acute bronchitis, unspecified: Secondary | ICD-10-CM | POA: Diagnosis not present

## 2017-09-23 DIAGNOSIS — E1165 Type 2 diabetes mellitus with hyperglycemia: Secondary | ICD-10-CM | POA: Diagnosis not present

## 2017-09-25 DIAGNOSIS — G459 Transient cerebral ischemic attack, unspecified: Secondary | ICD-10-CM | POA: Diagnosis not present

## 2017-09-25 DIAGNOSIS — E139 Other specified diabetes mellitus without complications: Secondary | ICD-10-CM | POA: Diagnosis not present

## 2017-09-25 DIAGNOSIS — D473 Essential (hemorrhagic) thrombocythemia: Secondary | ICD-10-CM | POA: Diagnosis not present

## 2017-09-25 DIAGNOSIS — I10 Essential (primary) hypertension: Secondary | ICD-10-CM | POA: Diagnosis not present

## 2017-10-02 DIAGNOSIS — H25812 Combined forms of age-related cataract, left eye: Secondary | ICD-10-CM | POA: Diagnosis not present

## 2017-10-02 DIAGNOSIS — Z961 Presence of intraocular lens: Secondary | ICD-10-CM | POA: Diagnosis not present

## 2017-10-02 DIAGNOSIS — E113553 Type 2 diabetes mellitus with stable proliferative diabetic retinopathy, bilateral: Secondary | ICD-10-CM | POA: Diagnosis not present

## 2017-10-10 DIAGNOSIS — I10 Essential (primary) hypertension: Secondary | ICD-10-CM | POA: Diagnosis not present

## 2017-10-10 DIAGNOSIS — F988 Other specified behavioral and emotional disorders with onset usually occurring in childhood and adolescence: Secondary | ICD-10-CM | POA: Diagnosis not present

## 2017-10-10 DIAGNOSIS — H25812 Combined forms of age-related cataract, left eye: Secondary | ICD-10-CM | POA: Diagnosis not present

## 2017-10-10 DIAGNOSIS — Z01812 Encounter for preprocedural laboratory examination: Secondary | ICD-10-CM | POA: Diagnosis not present

## 2017-10-10 DIAGNOSIS — F329 Major depressive disorder, single episode, unspecified: Secondary | ICD-10-CM | POA: Diagnosis not present

## 2017-10-10 DIAGNOSIS — E119 Type 2 diabetes mellitus without complications: Secondary | ICD-10-CM | POA: Diagnosis not present

## 2017-10-10 DIAGNOSIS — Z794 Long term (current) use of insulin: Secondary | ICD-10-CM | POA: Diagnosis not present

## 2017-10-11 DIAGNOSIS — Z7982 Long term (current) use of aspirin: Secondary | ICD-10-CM | POA: Diagnosis not present

## 2017-10-11 DIAGNOSIS — D649 Anemia, unspecified: Secondary | ICD-10-CM | POA: Diagnosis not present

## 2017-10-11 DIAGNOSIS — R7989 Other specified abnormal findings of blood chemistry: Secondary | ICD-10-CM | POA: Diagnosis not present

## 2017-10-11 DIAGNOSIS — D472 Monoclonal gammopathy: Secondary | ICD-10-CM | POA: Diagnosis not present

## 2017-10-15 NOTE — Progress Notes (Signed)
Erroneous encounter

## 2017-10-16 DIAGNOSIS — F988 Other specified behavioral and emotional disorders with onset usually occurring in childhood and adolescence: Secondary | ICD-10-CM | POA: Diagnosis not present

## 2017-10-16 DIAGNOSIS — F331 Major depressive disorder, recurrent, moderate: Secondary | ICD-10-CM | POA: Diagnosis not present

## 2017-10-16 DIAGNOSIS — Z79899 Other long term (current) drug therapy: Secondary | ICD-10-CM | POA: Diagnosis not present

## 2017-10-16 DIAGNOSIS — E1139 Type 2 diabetes mellitus with other diabetic ophthalmic complication: Secondary | ICD-10-CM | POA: Diagnosis not present

## 2017-10-16 DIAGNOSIS — I6521 Occlusion and stenosis of right carotid artery: Secondary | ICD-10-CM | POA: Diagnosis not present

## 2017-10-16 DIAGNOSIS — I6002 Nontraumatic subarachnoid hemorrhage from left carotid siphon and bifurcation: Secondary | ICD-10-CM | POA: Diagnosis not present

## 2017-10-16 DIAGNOSIS — H25812 Combined forms of age-related cataract, left eye: Secondary | ICD-10-CM | POA: Diagnosis not present

## 2017-10-16 DIAGNOSIS — Z961 Presence of intraocular lens: Secondary | ICD-10-CM | POA: Diagnosis not present

## 2017-10-16 DIAGNOSIS — Z794 Long term (current) use of insulin: Secondary | ICD-10-CM | POA: Diagnosis not present

## 2017-10-16 DIAGNOSIS — Z7982 Long term (current) use of aspirin: Secondary | ICD-10-CM | POA: Diagnosis not present

## 2017-10-16 DIAGNOSIS — I10 Essential (primary) hypertension: Secondary | ICD-10-CM | POA: Diagnosis not present

## 2017-10-16 DIAGNOSIS — E113513 Type 2 diabetes mellitus with proliferative diabetic retinopathy with macular edema, bilateral: Secondary | ICD-10-CM | POA: Diagnosis not present

## 2017-10-26 DIAGNOSIS — E113513 Type 2 diabetes mellitus with proliferative diabetic retinopathy with macular edema, bilateral: Secondary | ICD-10-CM | POA: Diagnosis not present

## 2017-10-26 DIAGNOSIS — Z961 Presence of intraocular lens: Secondary | ICD-10-CM | POA: Diagnosis not present

## 2017-10-26 DIAGNOSIS — Z48810 Encounter for surgical aftercare following surgery on the sense organs: Secondary | ICD-10-CM | POA: Diagnosis not present

## 2017-10-26 DIAGNOSIS — Z794 Long term (current) use of insulin: Secondary | ICD-10-CM | POA: Diagnosis not present

## 2017-10-26 DIAGNOSIS — Z9842 Cataract extraction status, left eye: Secondary | ICD-10-CM | POA: Diagnosis not present

## 2017-10-26 DIAGNOSIS — H40053 Ocular hypertension, bilateral: Secondary | ICD-10-CM | POA: Diagnosis not present

## 2017-11-10 DIAGNOSIS — Z961 Presence of intraocular lens: Secondary | ICD-10-CM | POA: Diagnosis not present

## 2017-11-10 DIAGNOSIS — H40053 Ocular hypertension, bilateral: Secondary | ICD-10-CM | POA: Diagnosis not present

## 2017-11-10 DIAGNOSIS — Z9842 Cataract extraction status, left eye: Secondary | ICD-10-CM | POA: Diagnosis not present

## 2017-11-10 DIAGNOSIS — E113513 Type 2 diabetes mellitus with proliferative diabetic retinopathy with macular edema, bilateral: Secondary | ICD-10-CM | POA: Diagnosis not present

## 2017-12-11 DIAGNOSIS — F988 Other specified behavioral and emotional disorders with onset usually occurring in childhood and adolescence: Secondary | ICD-10-CM | POA: Diagnosis not present

## 2017-12-11 DIAGNOSIS — Z79899 Other long term (current) drug therapy: Secondary | ICD-10-CM | POA: Diagnosis not present

## 2017-12-11 DIAGNOSIS — E11319 Type 2 diabetes mellitus with unspecified diabetic retinopathy without macular edema: Secondary | ICD-10-CM | POA: Diagnosis not present

## 2017-12-11 DIAGNOSIS — I10 Essential (primary) hypertension: Secondary | ICD-10-CM | POA: Diagnosis not present

## 2017-12-11 DIAGNOSIS — E10319 Type 1 diabetes mellitus with unspecified diabetic retinopathy without macular edema: Secondary | ICD-10-CM | POA: Diagnosis not present

## 2017-12-11 DIAGNOSIS — Z23 Encounter for immunization: Secondary | ICD-10-CM | POA: Diagnosis not present

## 2017-12-22 DIAGNOSIS — E669 Obesity, unspecified: Secondary | ICD-10-CM | POA: Diagnosis not present

## 2017-12-22 DIAGNOSIS — F339 Major depressive disorder, recurrent, unspecified: Secondary | ICD-10-CM | POA: Diagnosis not present

## 2017-12-22 DIAGNOSIS — Z6834 Body mass index (BMI) 34.0-34.9, adult: Secondary | ICD-10-CM | POA: Diagnosis not present

## 2018-01-15 DIAGNOSIS — Z1231 Encounter for screening mammogram for malignant neoplasm of breast: Secondary | ICD-10-CM | POA: Diagnosis not present

## 2018-01-16 ENCOUNTER — Ambulatory Visit (INDEPENDENT_AMBULATORY_CARE_PROVIDER_SITE_OTHER): Payer: Medicare Other | Admitting: Neurology

## 2018-01-16 ENCOUNTER — Encounter: Payer: Self-pay | Admitting: Neurology

## 2018-01-16 VITALS — BP 116/71 | HR 112 | Ht 61.0 in | Wt 187.0 lb

## 2018-01-16 DIAGNOSIS — G4730 Sleep apnea, unspecified: Secondary | ICD-10-CM

## 2018-01-16 DIAGNOSIS — I6381 Other cerebral infarction due to occlusion or stenosis of small artery: Secondary | ICD-10-CM

## 2018-01-16 DIAGNOSIS — G459 Transient cerebral ischemic attack, unspecified: Secondary | ICD-10-CM

## 2018-01-16 NOTE — Patient Instructions (Signed)
I had a long d/w patient about her recent  TIA versus small stroke, risk for recurrent stroke/TIAs, personally independently reviewed imaging studies and stroke evaluation results and answered questions.Continue aspirin 81 mg daily  for secondary stroke prevention and maintain strict control of hypertension with blood pressure goal below 130/90, diabetes with hemoglobin A1c goal below 6.5% and lipids with LDL cholesterol goal below 70 mg/dL. I also advised the patient to eat a healthy diet with plenty of whole grains, cereals, fruits and vegetables, exercise regularly and maintain ideal body weight . Recommend evaluation for sleep apnoea. Check echocardiogram and MRI brain w/wo and f/u lipids and HbA1c.Followup in the future with me in 3 months or call earlier if needed  Stroke Prevention Some medical conditions and behaviors are associated with a higher chance of having a stroke. You can help prevent a stroke by making nutrition, lifestyle, and other changes, including managing any medical conditions you may have. What nutrition changes can be made?  Eat healthy foods. You can do this by: ? Choosing foods high in fiber, such as fresh fruits and vegetables and whole grains. ? Eating at least 5 or more servings of fruits and vegetables a day. Try to fill half of your plate at each meal with fruits and vegetables. ? Choosing lean protein foods, such as lean cuts of meat, poultry without skin, fish, tofu, beans, and nuts. ? Eating low-fat dairy products. ? Avoiding foods that are high in salt (sodium). This can help lower blood pressure. ? Avoiding foods that have saturated fat, trans fat, and cholesterol. This can help prevent high cholesterol. ? Avoiding processed and premade foods.  Follow your health care provider's specific guidelines for losing weight, controlling high blood pressure (hypertension), lowering high cholesterol, and managing diabetes. These may include: ? Reducing your daily calorie  intake. ? Limiting your daily sodium intake to 1,500 milligrams (mg). ? Using only healthy fats for cooking, such as olive oil, canola oil, or sunflower oil. ? Counting your daily carbohydrate intake. What lifestyle changes can be made?  Maintain a healthy weight. Talk to your health care provider about your ideal weight.  Get at least 30 minutes of moderate physical activity at least 5 days a week. Moderate activity includes brisk walking, biking, and swimming.  Do not use any products that contain nicotine or tobacco, such as cigarettes and e-cigarettes. If you need help quitting, ask your health care provider. It may also be helpful to avoid exposure to secondhand smoke.  Limit alcohol intake to no more than 1 drink a day for nonpregnant women and 2 drinks a day for men. One drink equals 12 oz of beer, 5 oz of wine, or 1 oz of hard liquor.  Stop any illegal drug use.  Avoid taking birth control pills. Talk to your health care provider about the risks of taking birth control pills if: ? You are over 4 years old. ? You smoke. ? You get migraines. ? You have ever had a blood clot. What other changes can be made?  Manage your cholesterol levels. ? Eating a healthy diet is important for preventing high cholesterol. If cholesterol cannot be managed through diet alone, you may also need to take medicines. ? Take any prescribed medicines to control your cholesterol as told by your health care provider.  Manage your diabetes. ? Eating a healthy diet and exercising regularly are important parts of managing your blood sugar. If your blood sugar cannot be managed through diet and exercise,  you may need to take medicines. ? Take any prescribed medicines to control your diabetes as told by your health care provider.  Control your hypertension. ? To reduce your risk of stroke, try to keep your blood pressure below 130/80. ? Eating a healthy diet and exercising regularly are an important part  of controlling your blood pressure. If your blood pressure cannot be managed through diet and exercise, you may need to take medicines. ? Take any prescribed medicines to control hypertension as told by your health care provider. ? Ask your health care provider if you should monitor your blood pressure at home. ? Have your blood pressure checked every year, even if your blood pressure is normal. Blood pressure increases with age and some medical conditions.  Get evaluated for sleep disorders (sleep apnea). Talk to your health care provider about getting a sleep evaluation if you snore a lot or have excessive sleepiness.  Take over-the-counter and prescription medicines only as told by your health care provider. Aspirin or blood thinners (antiplatelets or anticoagulants) may be recommended to reduce your risk of forming blood clots that can lead to stroke.  Make sure that any other medical conditions you have, such as atrial fibrillation or atherosclerosis, are managed. What are the warning signs of a stroke? The warning signs of a stroke can be easily remembered as BEFAST.  B is for balance. Signs include: ? Dizziness. ? Loss of balance or coordination. ? Sudden trouble walking.  E is for eyes. Signs include: ? A sudden change in vision. ? Trouble seeing.  F is for face. Signs include: ? Sudden weakness or numbness of the face. ? The face or eyelid drooping to one side.  A is for arms. Signs include: ? Sudden weakness or numbness of the arm, usually on one side of the body.  S is for speech. Signs include: ? Trouble speaking (aphasia). ? Trouble understanding.  T is for time. ? These symptoms may represent a serious problem that is an emergency. Do not wait to see if the symptoms will go away. Get medical help right away. Call your local emergency services (911 in the [place] Do not drive yourself to the hospital.  Other signs of stroke may include: ? A sudden, severe headache  with no known cause. ? Nausea or vomiting. ? Seizure.  Where to find more information: For more information, visit:  American Stroke Association: www.strokeassociation.org  National Stroke Association: www.stroke.org  Summary  You can prevent a stroke by eating healthy, exercising, not smoking, limiting alcohol intake, and managing any medical conditions you may have.  Do not use any products that contain nicotine or tobacco, such as cigarettes and e-cigarettes. If you need help quitting, ask your health care provider. It may also be helpful to avoid exposure to secondhand smoke.  Remember BEFAST for warning signs of stroke. Get help right away if you or a loved one has any of these signs. This information is not intended to replace advice given to you by your health care provider. Make sure you discuss any questions you have with your health care provider. Document Released: 04/21/2004 Document Revised: 04/19/2016 Document Reviewed: 04/19/2016 Elsevier Interactive Patient Education  [employer].

## 2018-01-16 NOTE — Progress Notes (Signed)
[facility] [address] [place]. [place] [address] [phone]       OFFICE CONSULT NOTE  Ms. Lurline Del Date of Birth:  20-Apr-1965 Medical Record Number:  [id number]   Referring MD:  Charlott Holler, NP  Reason for Referral:  TIA  HPI: Jill [name] is a pleasant 52 year old Caucasian lady seen today for initial office consultation visit for a TIA.  History is obtained from the patient and review of referral notes as well as have personally reviewed imaging films in PACS.  Patient states on 09/16/2017 she was not feeling well most of the day when later on in the afternoon she developed sudden onset of difficulty speaking as well as numbness involving the right face and the body.  This evolved subsequently to tingling sensation on the right cheek arm and leg.  The patient was seen at [facility] where a CT scan of the head was obtained which showed no acute abnormality and CT angiogram was also obtained which showed no significant large vessel stenosis in the neck of the brain.  Carotid ultrasound was also obtained which showed no significant extracranial stenosis.  LDL cholesterol was elevated at 1 1 4  mg percent and hemoglobin A1c was 8.2.  Patient has history of essential thrombocytosis and is followed by hematologist.  She was started on aspirin 81 mg daily as well as lovastatin.  She states she is tolerating both medications well without bruising bleeding or muscle aches or pains.  Her blood pressure is well controlled today it is 11 6/71.  The patient states that her speech difficulties resolved fairly quickly but she still had numbness which took more than a month to resolve.  For unclear reason she never had an MRI scan of the brain or an echocardiogram performed.  The patient does have strong family history of strokes in both her dad as well as grandfather.  The patient does admit to snoring and has gained weight and may be at risk for sleep apnea but she  has never had a sleep study done so far.  She states she has very poor vision in both eyes secondary to diabetic retinopathy.  She however denies any symptoms of diabetic neuropathy.  She has no prior history of TIA, stroke, seizures or other neurological problems  ROS:   14 system review of systems is positive for fatigue, feeling hot and cold, easy bruising and all other systems negative  PMH:  Past Medical History:  Diagnosis Date  . ADHD   . Arthralgia   . Depression   . Diabetes mellitus without complication ([place])   . Diabetic retinopathy ([place])   . Glaucoma   . Hyperlipidemia   . Hypertension   . Macular edema   . Visual impairment     Social History:  Social History   Socioeconomic History  . Marital status: Married    Spouse name: Not on file  . Number of children: Not on file  . Years of education: Not on file  . Highest education level: Not on file  Occupational History  . Not on file  Social Needs  . Financial resource strain: Not on file  . Food insecurity:    Worry: Not on file    Inability: Not on file  . Transportation needs:    Medical: Not on file    Non-medical: Not on file  Tobacco Use  . Smoking status: Never Smoker  . Smokeless tobacco: Never Used  Substance and Sexual Activity  .  Alcohol use: Not Currently  . Drug use: Not Currently  . Sexual activity: Not on file  Lifestyle  . Physical activity:    Days per week: Not on file    Minutes per session: Not on file  . Stress: Not on file  Relationships  . Social connections:    Talks on phone: Not on file    Gets together: Not on file    Attends religious service: Not on file    Active member of club or organization: Not on file    Attends meetings of clubs or organizations: Not on file    Relationship status: Not on file  . Intimate partner violence:    Fear of current or ex partner: Not on file    Emotionally abused: Not on file    Physically abused: Not on file    Forced sexual  activity: Not on file  Other Topics Concern  . Not on file  Social History Narrative  . Not on file    Medications:   Current Outpatient Medications on File Prior to Visit  Medication Sig Dispense Refill  . acetaminophen (TYLENOL) 325 MG tablet Take 650 mg by mouth.    Marland Kitchen albuterol (PROVENTIL HFA;VENTOLIN HFA) 108 (90 Base) MCG/ACT inhaler Inhale into the lungs every 6 (six) hours as needed for wheezing or shortness of breath.    . amphetamine-dextroamphetamine (ADDERALL) 20 MG tablet 20 mg 2 (two) times daily.     Marland Kitchen aspirin EC 81 MG tablet Take by mouth.    . dorzolamide-timolol (COSOPT) 22.3-6.8 MG/ML ophthalmic solution Place 1 drop into both eyes 2 times daily.    . DULoxetine (CYMBALTA) 60 MG capsule Take 60 mg by mouth daily.  1  . hydrOXYzine (ATARAX/VISTARIL) 25 MG tablet 3 times daily.    . insulin aspart (NOVOLOG) cartridge Inject into the skin.    Marland Kitchen latanoprost (XALATAN) 0.005 % ophthalmic solution INSTILL 1 DROP INTO EACH EYE NIGHTLY  11  . lisinopril-hydrochlorothiazide (PRINZIDE,ZESTORETIC) 20-12.5 MG tablet Take 1 tablet by mouth daily.    Marland Kitchen loratadine (CLARITIN) 10 MG tablet Take 10 mg by mouth.    . lovastatin (MEVACOR) 20 MG tablet Take by mouth.    . metFORMIN (GLUCOPHAGE-XR) 500 MG 24 hr tablet 500 mg 2 (two) times daily.     . polyvinyl alcohol (LIQUIFILM TEARS) 1.4 % ophthalmic solution Place 1 drop into both eyes 2 times daily.     No current facility-administered medications on file prior to visit.     Allergies:   Allergies  Allergen Reactions  . Pb-Hyoscy-Atropine-Scopolamine   . Canagliflozin Rash  . Latex Rash    Blistery rash  . Pb-Hyoscy-Atropine-Scopolamine Rash  . Sulfamethoxazole Itching    Physical Exam General: well developed, well nourished, middle aged  Caucasian lady seated, in no evident distress Head: head normocephalic and atraumatic.   Neck: supple with no carotid or supraclavicular bruits Cardiovascular: regular rate and rhythm, no  murmurs Musculoskeletal: no deformity Skin:  no rash/petichiae Vascular:  Normal pulses all extremities  Neurologic Exam Mental Status: Awake and fully alert. Oriented to place and time. Recent and remote memory intact. Attention span, concentration and fund of knowledge appropriate. Mood and affect appropriate.  Cranial Nerves: Fundoscopic exam reveals sharp disc margins. Pupils equal, briskly reactive to light.  Vision acuity is limited bilaterally due to her diabetic retinopathy and she states she is legally blind but she was able to cooperate fully with visual field testing which seemed adequate .extraocular movements  full without nystagmus. Visual fields full to confrontation. Hearing intact. Facial sensation intact. Face, tongue, palate moves normally and symmetrically.  Motor: Normal bulk and tone. Normal strength in all tested extremity muscles. Sensory.: intact to touch , pinprick , position and vibratory sensation.  Coordination: Rapid alternating movements normal in all extremities. Finger-to-nose and heel-to-shin performed accurately bilaterally. Gait and Station: Arises from chair without difficulty. Stance is normal. Gait demonstrates normal stride length and balance . Able to heel, toe and tandem walk without difficulty.  Reflexes: 1+ and symmetric. Toes downgoing.   NIHSS  0 Modified Rankin 1  ASSESSMENT: 52 year old lady with episode of right-sided numbness and difficulty speaking likely small left brain infarct versus TIA from lacunar disease.  Vascular risk factors of hypertension, diabetes, and essential thrombocytosis and hyperlipidemia.    PLAN: I had a long d/w patient about her recent  TIA versus small stroke, risk for recurrent stroke/TIAs, personally independently reviewed imaging studies and stroke evaluation results and answered questions.Continue aspirin 81 mg daily  for secondary stroke prevention and maintain strict control of hypertension with blood pressure  goal below 130/90, diabetes with hemoglobin A1c goal below 6.5% and lipids with LDL cholesterol goal below 70 mg/dL. I also advised the patient to eat a healthy diet with plenty of whole grains, cereals, fruits and vegetables, exercise regularly and maintain ideal body weight . Recommend evaluation for sleep apnoea. Check echocardiogram and MRI brain w/wo and f/u lipids and HbA1c.. Greater than 50% time during this 45-minute consultation visit was spent on counseling and coordination of care about her TIA and lacunar disease and discussion about stroke prevention and treatment and answering questions.  Followup in the future with me in 3 months or call earlier if needed Antony Contras, MD  [facility] [address] [place], [place] [address]  Phone [phone] Fax [phone] Note: This document was prepared with digital dictation and possible smart phrase technology. Any transcriptional errors that result from this process are unintentional.

## 2018-01-17 ENCOUNTER — Telehealth: Payer: Self-pay | Admitting: Neurology

## 2018-01-17 NOTE — Telephone Encounter (Signed)
Medicare order sent to [facility]. No auth they will reach out to the pt to schedule.  °

## 2018-01-17 NOTE — Telephone Encounter (Signed)
Patient is aware of this and if she has not heard in the next 2-3 business days to give them a call at [phone].

## 2018-01-19 DIAGNOSIS — H40053 Ocular hypertension, bilateral: Secondary | ICD-10-CM | POA: Diagnosis not present

## 2018-01-19 DIAGNOSIS — Z961 Presence of intraocular lens: Secondary | ICD-10-CM | POA: Diagnosis not present

## 2018-01-19 DIAGNOSIS — Z79899 Other long term (current) drug therapy: Secondary | ICD-10-CM | POA: Diagnosis not present

## 2018-01-19 DIAGNOSIS — Z9842 Cataract extraction status, left eye: Secondary | ICD-10-CM | POA: Diagnosis not present

## 2018-01-19 DIAGNOSIS — E113513 Type 2 diabetes mellitus with proliferative diabetic retinopathy with macular edema, bilateral: Secondary | ICD-10-CM | POA: Diagnosis not present

## 2018-01-19 DIAGNOSIS — Z794 Long term (current) use of insulin: Secondary | ICD-10-CM | POA: Diagnosis not present

## 2018-01-22 ENCOUNTER — Other Ambulatory Visit (HOSPITAL_COMMUNITY): Payer: Medicare Other

## 2018-01-29 ENCOUNTER — Ambulatory Visit (HOSPITAL_COMMUNITY): Payer: Medicare Other | Attending: Cardiovascular Disease

## 2018-01-29 ENCOUNTER — Other Ambulatory Visit: Payer: Self-pay

## 2018-01-29 DIAGNOSIS — G459 Transient cerebral ischemic attack, unspecified: Secondary | ICD-10-CM | POA: Insufficient documentation

## 2018-01-29 DIAGNOSIS — I6381 Other cerebral infarction due to occlusion or stenosis of small artery: Secondary | ICD-10-CM | POA: Diagnosis not present

## 2018-01-31 ENCOUNTER — Telehealth: Payer: Self-pay

## 2018-01-31 NOTE — Telephone Encounter (Signed)
Pt returned my call. I advised her of her normal echo. I reminded pt of her appts with Dr. Rexene Alberts and Dr. Leonie Man in December. Pt is saying that she called regarding her MRI and no one has called her back. She is asking if our office can check on this. Pt verbalized understanding of results. Pt had no questions at this time but was encouraged to call back if questions arise.

## 2018-01-31 NOTE — Telephone Encounter (Signed)
-----   Message from Garvin Fila, MD sent at 01/30/2018  8:46 AM EST ----- Kindly inform the patient that echocardiogram study was normal.

## 2018-01-31 NOTE — Telephone Encounter (Signed)
**Note Jill-Identified via Obfuscation** Pt reported to me that she called [facility] and they have not returned her calls. I will send pt a mychart message giving her Jill [name] phone number again.

## 2018-01-31 NOTE — Telephone Encounter (Signed)
I called pt to discuss her echo results. Pt's husband answered, Gerald Stabs. I don't see a "Gerald Stabs' on pt's DPR. Gerald Stabs will ask pt to call me back.

## 2018-02-15 DIAGNOSIS — Z961 Presence of intraocular lens: Secondary | ICD-10-CM | POA: Diagnosis not present

## 2018-02-15 DIAGNOSIS — H40053 Ocular hypertension, bilateral: Secondary | ICD-10-CM | POA: Diagnosis not present

## 2018-02-15 DIAGNOSIS — H16423 Pannus (corneal), bilateral: Secondary | ICD-10-CM | POA: Diagnosis not present

## 2018-02-15 DIAGNOSIS — Z888 Allergy status to other drugs, medicaments and biological substances status: Secondary | ICD-10-CM | POA: Diagnosis not present

## 2018-02-15 DIAGNOSIS — Z9842 Cataract extraction status, left eye: Secondary | ICD-10-CM | POA: Diagnosis not present

## 2018-02-15 DIAGNOSIS — Z91040 Latex allergy status: Secondary | ICD-10-CM | POA: Diagnosis not present

## 2018-02-15 DIAGNOSIS — E113513 Type 2 diabetes mellitus with proliferative diabetic retinopathy with macular edema, bilateral: Secondary | ICD-10-CM | POA: Diagnosis not present

## 2018-02-15 DIAGNOSIS — Z794 Long term (current) use of insulin: Secondary | ICD-10-CM | POA: Diagnosis not present

## 2018-02-15 DIAGNOSIS — Z882 Allergy status to sulfonamides status: Secondary | ICD-10-CM | POA: Diagnosis not present

## 2018-03-07 DIAGNOSIS — L02821 Furuncle of head [any part, except face]: Secondary | ICD-10-CM | POA: Diagnosis not present

## 2018-03-08 ENCOUNTER — Ambulatory Visit (INDEPENDENT_AMBULATORY_CARE_PROVIDER_SITE_OTHER): Payer: Medicare Other | Admitting: Neurology

## 2018-03-08 ENCOUNTER — Encounter: Payer: Self-pay | Admitting: Neurology

## 2018-03-08 VITALS — BP 144/83 | HR 84 | Ht 61.0 in | Wt 191.0 lb

## 2018-03-08 DIAGNOSIS — G4719 Other hypersomnia: Secondary | ICD-10-CM | POA: Diagnosis not present

## 2018-03-08 DIAGNOSIS — R351 Nocturia: Secondary | ICD-10-CM | POA: Diagnosis not present

## 2018-03-08 DIAGNOSIS — I6381 Other cerebral infarction due to occlusion or stenosis of small artery: Secondary | ICD-10-CM

## 2018-03-08 DIAGNOSIS — R0683 Snoring: Secondary | ICD-10-CM

## 2018-03-08 DIAGNOSIS — E669 Obesity, unspecified: Secondary | ICD-10-CM

## 2018-03-08 DIAGNOSIS — Z8673 Personal history of transient ischemic attack (TIA), and cerebral infarction without residual deficits: Secondary | ICD-10-CM

## 2018-03-08 NOTE — Patient Instructions (Signed)

## 2018-03-08 NOTE — Progress Notes (Signed)
Subjective:    Patient ID: Jill [name] is a 52 y.o. female.  HPI     Star Age, MD, PhD [facility] [address], [address] [employer] [place], [place] [address]  Dear Mamie Nick,   I saw your patient, [name], upon your kind request in my sleep clinic today for initial consultation of her sleep disorder, in particular, concern for underlying obstructive sleep apnea. The patient is unaccompanied today. As you know, Ms. Curtner is a 52 year old right-handed woman with an underlying medical history of diabetes with a history of visual impairment secondary to retinopathy, history of glaucoma, s/p cataract removal, history of macular edema with legal blindness both eyes, hypertension, hyperlipidemia, depression, arthritis, ADHD, TIA and obesity, who reports snoring and excessive daytime somnolence. I reviewed your office note from 01/16/2018. Her Epworth sleepiness score is 12 out of 24, fatigue score is 56 out of 63. She is married and lives with her husband, she has 1 child. She is a nonsmoker and drinks alcohol infrequently, maybe twice a year or so, drinks caffeine in the form of coffee, 3 per day on average. She goes to bed around 10, rise time is around 7. She does with her husband and her 5 year old stepson, she has shared custody of her 50 year old daughter. She has a TV in the bedroom but rarely uses it. They have 2 dogs in the household typically not in their bedroom. She has nocturia about once per average night and denies recurrent morning headaches. Her mother has sleep apnea and uses a CPAP machine. She would be willing to try CPAP therapy for need arises.  Her Past Medical History Is Significant For: Past Medical History:  Diagnosis Date  . ADHD   . Arthralgia   . Depression   . Diabetes mellitus without complication ([place])   . Diabetic retinopathy ([place])   . Glaucoma   . Hyperlipidemia   . Hypertension   . Macular edema   .  Visual impairment     Her Past Surgical History Is Significant For: Past Surgical History:  Procedure Laterality Date  . CATARACT EXTRACTION     left eye  . CESAREAN SECTION    . left foot surgery      Her Family History Is Significant For: Family History  Problem Relation Age of Onset  . Stroke Father   . Stroke Paternal Grandfather     Her Social History Is Significant For: Social History   Socioeconomic History  . Marital status: Married    Spouse name: Not on file  . Number of children: Not on file  . Years of education: Not on file  . Highest education level: Not on file  Occupational History  . Not on file  Social Needs  . Financial resource strain: Not on file  . Food insecurity:    Worry: Not on file    Inability: Not on file  . Transportation needs:    Medical: Not on file    Non-medical: Not on file  Tobacco Use  . Smoking status: Never Smoker  . Smokeless tobacco: Never Used  Substance and Sexual Activity  . Alcohol use: Not Currently  . Drug use: Not Currently  . Sexual activity: Not on file  Lifestyle  . Physical activity:    Days per week: Not on file    Minutes per session: Not on file  . Stress: Not on file  Relationships  . Social connections:    Talks on phone: Not  on file    Gets together: Not on file    Attends religious service: Not on file    Active member of club or organization: Not on file    Attends meetings of clubs or organizations: Not on file    Relationship status: Not on file  Other Topics Concern  . Not on file  Social History Narrative  . Not on file    Her Allergies Are:  Allergies  Allergen Reactions  . Pb-Hyoscy-Atropine-Scopolamine   . Canagliflozin Rash  . Latex Rash    Blistery rash  . Pb-Hyoscy-Atropine-Scopolamine Rash  . Sulfamethoxazole Itching  :   Her Current Medications Are:  Outpatient Encounter Medications as of 03/08/2018  Medication Sig  . acetaminophen (TYLENOL) 325 MG tablet Take 650 mg  by mouth.  Marland Kitchen albuterol (PROVENTIL HFA;VENTOLIN HFA) 108 (90 Base) MCG/ACT inhaler Inhale into the lungs every 6 (six) hours as needed for wheezing or shortness of breath.  . amphetamine-dextroamphetamine (ADDERALL) 20 MG tablet 20 mg 2 (two) times daily.   Marland Kitchen aspirin EC 81 MG tablet Take by mouth.  Marland Kitchen buPROPion (WELLBUTRIN SR) 150 MG 12 hr tablet Take 150 mg by mouth 2 (two) times daily.  . dorzolamide-timolol (COSOPT) 22.3-6.8 MG/ML ophthalmic solution Place 1 drop into both eyes 2 times daily.  . DULoxetine (CYMBALTA) 60 MG capsule Take 60 mg by mouth daily.  . hydrOXYzine (ATARAX/VISTARIL) 25 MG tablet 3 times daily.  . insulin aspart (NOVOLOG) cartridge Inject 10 Units into the skin.   Marland Kitchen latanoprost (XALATAN) 0.005 % ophthalmic solution INSTILL 1 DROP INTO EACH EYE NIGHTLY  . lisinopril-hydrochlorothiazide (PRINZIDE,ZESTORETIC) 20-12.5 MG tablet Take 1 tablet by mouth daily.  Marland Kitchen loratadine (CLARITIN) 10 MG tablet Take 10 mg by mouth.  . lovastatin (MEVACOR) 20 MG tablet Take by mouth.  . metFORMIN (GLUCOPHAGE-XR) 500 MG 24 hr tablet Take 100 mg by mouth 2 (two) times daily.  . polyvinyl alcohol (LIQUIFILM TEARS) 1.4 % ophthalmic solution Place 1 drop into both eyes 2 times daily.  . [DISCONTINUED] metFORMIN (GLUCOPHAGE-XR) 500 MG 24 hr tablet 500 mg 2 (two) times daily.    No facility-administered encounter medications on file as of 03/08/2018.   :  Review of Systems:  Out of a complete 14 point review of systems, all are reviewed and negative with the exception of these symptoms as listed below: Review of Systems  Neurological:       Pt presents today to discuss her sleep. Pt has never had a sleep study but does endorse snoring.  Epworth Sleepiness Scale 0= would never doze 1= slight chance of dozing 2= moderate chance of dozing 3= high chance of dozing  Sitting and reading: 1 Watching TV: 2 Sitting inactive in a public place (ex. Theater or meeting): 2 As a passenger in a car  for an hour without a break: 2 Lying down to rest in the afternoon: 3 Sitting and talking to someone: 0 Sitting quietly after lunch (no alcohol): 2 In a car, while stopped in traffic: 0 Total: 12     Objective:  Neurological Exam  Physical Exam Physical Examination:   Vitals:   03/08/18 0853  BP: (!) 144/83  Pulse: 84    General Examination: The patient is a very pleasant 52 y.o. female in no acute distress. She appears well-developed and well-nourished and well groomed.   HEENT: Normocephalic, atraumatic, pupils are equal, round and reactive to light and accommodation. She is visually impaired. She is status post cataract repairs. Hearing  is grossly intact. Face is symmetric with normal facial animation and normal facial sensation. Speech is clear with no dysarthria noted. There is no hypophonia. There is no lip, neck/head, jaw or voice tremor. Neck is supple with full range of passive and active motion. There are no carotid bruits on auscultation. Oropharynx exam reveals: mild mouth dryness, adequate dental hygiene and mild to moderate airway crowding secondary to tonsils are 1-2+, smaller airway entry. Mallampati is class II. Neck circumference is 14-3/4 inches. She has a mild overbite.  Chest: Clear to auscultation without wheezing, rhonchi or crackles noted.  Heart: S1+S2+0, regular and normal without murmurs, rubs or gallops noted.   Abdomen: Soft, non-tender and non-distended with normal bowel sounds appreciated on auscultation.  Extremities: There is no pitting edema in the distal lower extremities bilaterally.   Skin: Warm and dry without trophic changes noted.  Musculoskeletal: exam reveals no obvious joint deformities, tenderness or joint swelling or erythema.   Neurologically:  Mental status: The patient is awake, alert and oriented in all 4 spheres. Her immediate and remote memory, attention, language skills and fund of knowledge are appropriate. There is no  evidence of aphasia, agnosia, apraxia or anomia. Speech is clear with normal prosody and enunciation. Thought process is linear. Mood is normal and affect is normal.  Cranial nerves II - XII are as described above under HEENT exam. In addition: shoulder shrug is normal with equal shoulder height noted. Motor exam: Normal bulk, strength and tone is noted. There is no drift, tremor or rebound. Romberg is negative. Fine motor skills and coordination: grossly intact.  Cerebellar testing: No dysmetria or intention tremor on finger to nose testing. Heel to shin is unremarkable bilaterally. There is no truncal or gait ataxia.  Sensory exam: intact to light touch.  Gait, station and balance: She stands easily. No veering to one side is noted. No leaning to one side is noted. Posture is age-appropriate and stance is narrow based. Gait shows normal stride length and normal pace. No problems turning are noted. Tandem walk is difficult for her.  Assessment and Plan:  In summary, Chablis Losh is a very pleasant 52 y.o.-year old female with an underlying medical history of diabetes with a history of visual impairment secondary to retinopathy, history of glaucoma, history of macular edema, hypertension, hyperlipidemia, depression, arthritis, ADHD, TIA and obesity, whose history and physical exam are concerning for obstructive sleep apnea (OSA). I had a long chat with the patient about my findings and the diagnosis of OSA, its prognosis and treatment options. We talked about medical treatments, surgical interventions and non-pharmacological approaches. I explained in particular the risks and ramifications of untreated moderate to severe OSA, especially with respect to developing cardiovascular disease down the Road, including congestive heart failure, difficult to treat hypertension, cardiac arrhythmias, or stroke. Even type 2 diabetes has, in part, been linked to untreated OSA. Symptoms of untreated OSA include  daytime sleepiness, memory problems, mood irritability and mood disorder such as depression and anxiety, lack of energy, as well as recurrent headaches, especially morning headaches. We talked about trying to maintain a healthy lifestyle in general, as well as the importance of weight control. I encouraged the patient to eat healthy, exercise daily and keep well hydrated, to keep a scheduled bedtime and wake time routine, to not skip any meals and eat healthy snacks in between meals. I advised the patient not to drive when feeling sleepy. I recommended the following at this time: sleep study with  potential positive airway pressure titration. (We will score hypopneas at 4%).   I explained the sleep test procedure to the patient and also outlined possible surgical and non-surgical treatment options of OSA, including the use of a custom-made dental device (which would require a referral to a specialist dentist or oral surgeon), upper airway surgical options, such as pillar implants, radiofrequency surgery, tongue base surgery, and UPPP (which would involve a referral to an ENT surgeon). Rarely, jaw surgery such as mandibular advancement may be considered.  I also explained the CPAP treatment option to the patient, who indicated that she would be willing to try CPAP if the need arises. I explained the importance of being compliant with PAP treatment, not only for insurance purposes but primarily to improve Her symptoms, and for the patient's long term health benefit, including to reduce Her cardiovascular risks. I answered all her questions today and the patient was in agreement. I plan to see her back after the sleep study is completed and encouraged her to call with any interim questions, concerns, problems or updates.   Thank you very much for allowing me to participate in the care of this nice patient. If I can be of any further assistance to you please do not hesitate to call me at  [phone].  Sincerely,   Star Age, MD, PhD

## 2018-03-15 ENCOUNTER — Ambulatory Visit: Payer: Medicare Other | Admitting: Neurology

## 2018-03-29 DIAGNOSIS — Z79899 Other long term (current) drug therapy: Secondary | ICD-10-CM | POA: Diagnosis not present

## 2018-03-29 DIAGNOSIS — E10319 Type 1 diabetes mellitus with unspecified diabetic retinopathy without macular edema: Secondary | ICD-10-CM | POA: Diagnosis not present

## 2018-03-29 DIAGNOSIS — F988 Other specified behavioral and emotional disorders with onset usually occurring in childhood and adolescence: Secondary | ICD-10-CM | POA: Diagnosis not present

## 2018-03-29 DIAGNOSIS — I10 Essential (primary) hypertension: Secondary | ICD-10-CM | POA: Diagnosis not present

## 2018-03-29 DIAGNOSIS — E11319 Type 2 diabetes mellitus with unspecified diabetic retinopathy without macular edema: Secondary | ICD-10-CM | POA: Diagnosis not present

## 2018-04-10 ENCOUNTER — Ambulatory Visit (INDEPENDENT_AMBULATORY_CARE_PROVIDER_SITE_OTHER): Payer: Medicare Other | Admitting: Neurology

## 2018-04-10 DIAGNOSIS — R0683 Snoring: Secondary | ICD-10-CM

## 2018-04-10 DIAGNOSIS — G4719 Other hypersomnia: Secondary | ICD-10-CM

## 2018-04-10 DIAGNOSIS — Z8673 Personal history of transient ischemic attack (TIA), and cerebral infarction without residual deficits: Secondary | ICD-10-CM

## 2018-04-10 DIAGNOSIS — R9431 Abnormal electrocardiogram [ECG] [EKG]: Secondary | ICD-10-CM

## 2018-04-10 DIAGNOSIS — G4733 Obstructive sleep apnea (adult) (pediatric): Secondary | ICD-10-CM

## 2018-04-10 DIAGNOSIS — G4720 Circadian rhythm sleep disorder, unspecified type: Secondary | ICD-10-CM

## 2018-04-10 DIAGNOSIS — R351 Nocturia: Secondary | ICD-10-CM

## 2018-04-10 DIAGNOSIS — E669 Obesity, unspecified: Secondary | ICD-10-CM

## 2018-04-13 DIAGNOSIS — Z1211 Encounter for screening for malignant neoplasm of colon: Secondary | ICD-10-CM | POA: Diagnosis not present

## 2018-04-17 ENCOUNTER — Telehealth: Payer: Self-pay

## 2018-04-17 NOTE — Telephone Encounter (Signed)
-----   Message from Star Age, MD sent at 04/17/2018  7:40 AM EST ----- Patient referred by Dr. Leonie Man, seen by me on 03/08/18, diagnostic PSG on 04/10/18.    Please call and notify the patient that the recent sleep study showed mild or borderline OSA, but much more pronounced during REM sleep. OSA may be worth treating to see if she feels better after treatment. To that end I recommend treatment for this in the form of autoPAP, which means, that we don't have to bring her back for a second sleep study with CPAP, but will let him try an autoPAP machine at home, through a DME company (of her choice, or as per insurance requirement). The DME representative will educate her on how to use the machine, how to put the mask on, etc. I have placed an order in the chart. Please send referral, talk to patient, send report to referring MD. We will need a FU in sleep clinic for 10 weeks post-PAP set up, please arrange that with me or one of our NPs. Thanks,   Star Age, MD, PhD [facility])

## 2018-04-17 NOTE — Telephone Encounter (Signed)
I called pt to discuss her sleep study results. No answer, left a message asking her to call me back. 

## 2018-04-17 NOTE — Procedures (Signed)
PATIENT'S NAME:  Jill, [name] DOB:      1966-02-06      MR#:    [id number]     DATE OF RECORDING: 04/10/2018 REFERRING M.D.:  Antony Contras, MD Study Performed:   Baseline Polysomnogram HISTORY: 53 year old woman with a history of diabetes with retinopathy, history of glaucoma, s/p cataract removal, macular edema with legal blindness eyes, hypertension, hyperlipidemia, depression, arthritis, ADHD, TIA and obesity, who reports snoring and excessive daytime somnolence. The patient endorsed the Epworth Sleepiness Scale at 12 points. The patient's weight 192 pounds with a height of 61 (inches), resulting in a BMI of 36.2 kg/m2. The patient's neck circumference measured 14.8 inches.  CURRENT MEDICATIONS: Tylenol, Proventil, Adderall, Wellbutrin, Aspirin, Cymbalta, Vistaril, Novolog, Xalatan, Lisinopril, Claritin, Mevacor, Metformin   PROCEDURE:  This is a multichannel digital polysomnogram utilizing the Somnostar 11.2 system.  Electrodes and sensors were applied and monitored per AASM Specifications.   EEG, EOG, Chin and Limb EMG, were sampled at 200 Hz.  ECG, Snore and Nasal Pressure, Thermal Airflow, Respiratory Effort, CPAP Flow and Pressure, Oximetry was sampled at 50 Hz. Digital video and audio were recorded.      BASELINE STUDY  Lights Out was at 21:52 and Lights On at 05:00.  Total recording time (TRT) was 428 minutes, with a total sleep time (TST) of 361 minutes.   The patient's sleep latency was 13 minutes. REM latency was 251.5 minutes, which is markedly delayed. The sleep efficiency was 84.3%.     SLEEP ARCHITECTURE: WASO (Wake after sleep onset) was 61.5 minutes.  There were 45 minutes in Stage N1, 93.5 minutes Stage N2, 183.5 minutes Stage N3 and 39 minutes in Stage REM.  The percentage of Stage N1 was 12.5%, Stage N2 was 25.9%, Stage N3 was 50.8% and Stage R (REM sleep) was 10.8%, which is reduced. The arousals were noted as: 78 were spontaneous, 3 were associated with PLMs, 6 were  associated with respiratory events.  RESPIRATORY ANALYSIS:  There were a total of 34 respiratory events:  0 obstructive apneas, 0 central apneas and 0 mixed apneas with a total of 0 apneas and an apnea index (AI) of 0 /hour. There were 34 hypopneas with a hypopnea index of 5.7 /hour. The patient also had 0 respiratory event related arousals (RERAs).      The total APNEA/HYPOPNEA INDEX (AHI) was 5.7/hour and the total RESPIRATORY DISTURBANCE INDEX was 5.7 /hour.  22 events occurred in REM sleep and 24 events in NREM. The REM AHI was 33.8 /hour, versus a non-REM AHI of 2.2. The patient spent 169 minutes of total sleep time in the supine position and 192 minutes in non-supine.. The supine AHI was 8.5 versus a non-supine AHI of 3.1.  OXYGEN SATURATION & C02:  The Wake baseline 02 saturation was 97%, with the lowest being 83% (56% was error). Time spent below 89% saturation equaled 10 minutes.  PERIODIC LIMB MOVEMENTS: The patient had a total of 10 Periodic Limb Movements.  The Periodic Limb Movement (PLM) index was 1.7 and the PLM Arousal index was .5/hour.  Audio and video analysis did not show any abnormal or unusual movements, behaviors, phonations or vocalizations. The patient took bathroom breaks. Snoring was noted, ranging from mild to loud. The EKG was in keeping with normal sinus rhythm (NSR) with occasional PVCs noted. Post-study, the patient indicated that sleep was better than usual.   IMPRESSION:  1. Obstructive Sleep Apnea (OSA) 2. Dysfunctions associated with sleep stages or arousal from sleep  3. Non-specific abnormal EKG  RECOMMENDATIONS:  1. This study demonstrates overall mild or borderline obstructive sleep apnea, severe during REM sleep with a total AHI of 5.7/hour, REM AHI of 33.8/hour, and O2 nadir of 83%. Given the patient's medical history and sleep related complaints, treatment with positive airway pressure can be considered and will be offered to the patient. Treatment can  be achieved in the form of autoPAP. Alternatively, a full-night CPAP titration study would allow optimization of therapy if needed. Other treatment options may include avoidance of supine sleep position along with weight loss, upper airway or jaw surgery in selected patients or the use of an oral appliance in certain patients. ENT evaluation and/or consultation with a maxillofacial surgeon or dentist may be feasible in some instances.    2. Please note that untreated obstructive sleep apnea may carry additional perioperative morbidity. Patients with significant obstructive sleep apnea should receive perioperative PAP therapy and the surgeons and particularly the anesthesiologist should be informed of the diagnosis and the severity of the sleep disordered breathing. 3. The study showed occasional PVCs on single lead EKG; clinical correlation is recommended and consultation with cardiology may be feasible.  4. This study shows sleep fragmentation and abnormal sleep stage percentages; these are nonspecific findings and per se do not signify an intrinsic sleep disorder or a cause for the patient's sleep-related symptoms. Causes include (but are not limited to) the first night effect of the sleep study, circadian rhythm disturbances, medication effect or an underlying mood disorder or medical problem.  5. The patient should be cautioned not to drive, work at heights, or operate dangerous or heavy equipment when tired or sleepy. Review and reiteration of good sleep hygiene measures should be pursued with any patient. 6. The patient will be seen in follow-up by Dr. Rexene Alberts at [facility] for discussion of the test results and further management strategies. The referring provider will be notified of the test results.  I certify that I have reviewed the entire raw data recording prior to the issuance of this report in accordance with the Standards of Accreditation of the American Academy of Sleep Medicine (AASM)  Star Age,  MD, PhD Diplomat, American Board of Neurology and Sleep Medicine (Neurology and Sleep Medicine)

## 2018-04-17 NOTE — Addendum Note (Signed)
Addended by: Star Age on: 04/17/2018 07:40 AM   Modules accepted: Orders

## 2018-04-17 NOTE — Progress Notes (Signed)
Patient referred by Dr. Leonie Man, seen by me on 03/08/18, diagnostic PSG on 04/10/18.    Please call and notify the patient that the recent sleep study showed mild or borderline OSA, but much more pronounced during REM sleep. OSA may be worth treating to see if she feels better after treatment. To that end I recommend treatment for this in the form of autoPAP, which means, that we don't have to bring her back for a second sleep study with CPAP, but will let him try an autoPAP machine at home, through a DME company (of her choice, or as per insurance requirement). The DME representative will educate her on how to use the machine, how to put the mask on, etc. I have placed an order in the chart. Please send referral, talk to patient, send report to referring MD. We will need a FU in sleep clinic for 10 weeks post-PAP set up, please arrange that with me or one of our NPs. Thanks,   Star Age, MD, PhD [facility])

## 2018-04-18 NOTE — Telephone Encounter (Signed)
I called pt to discuss her sleep study results. No answer, left a message asking her to call me back. 

## 2018-04-19 NOTE — Telephone Encounter (Signed)
I called pt to discuss her sleep study results. No answer, left a message asking her to call me back. This is my 3rd unsuccessful attempt at reaching her by phone. Will send her a [employer].

## 2018-04-23 NOTE — Telephone Encounter (Signed)
Pt has not returned any of my 3 calls nor read the mychart message I have sent her. I will try sending a letter.

## 2018-04-26 ENCOUNTER — Encounter: Payer: Self-pay | Admitting: Neurology

## 2018-04-26 ENCOUNTER — Telehealth: Payer: Self-pay | Admitting: Neurology

## 2018-04-26 ENCOUNTER — Ambulatory Visit (INDEPENDENT_AMBULATORY_CARE_PROVIDER_SITE_OTHER): Payer: Medicare Other | Admitting: Neurology

## 2018-04-26 VITALS — BP 126/69 | HR 102 | Ht 61.0 in | Wt 188.4 lb

## 2018-04-26 DIAGNOSIS — G459 Transient cerebral ischemic attack, unspecified: Secondary | ICD-10-CM | POA: Diagnosis not present

## 2018-04-26 NOTE — Patient Instructions (Signed)
I had a long d/w patient about her TIA, new diagnosis of sleep apnoea risk for recurrent stroke/TIAs, personally independently reviewed imaging studies and stroke evaluation results and answered questions.Continue aspirin 81 mg daily  for secondary stroke prevention and maintain strict control of hypertension with blood pressure goal below 130/90, diabetes with hemoglobin A1c goal below 6.5% and lipids with LDL cholesterol goal below 70 mg/dL. I also advised the patient to eat a healthy diet with plenty of whole grains, cereals, fruits and vegetables, exercise regularly and maintain ideal body weight.  I advised her to follow-up with Dr. Rexene Alberts for treatment for her sleep apnea with CPAP.  Check MRI of the brain as of unclear reason it has not been done.  Followup in the future with me only as necessary.

## 2018-04-26 NOTE — Telephone Encounter (Signed)
Pt stopped by to see what the next steps are in her process. Can you please call pt again. The number in the number is her husband's number

## 2018-04-26 NOTE — Progress Notes (Signed)
[facility] [address] [place]. [place] [address] [phone]       OFFICE CONSULT NOTE  Ms. Lurline Del Date of Birth:  04-29-1965 Medical Record Number:  [id number]   Referring MD:  Charlott Holler, NP  Reason for Referral:  TIA  HPI: Jill [name] is a pleasant 53 year old Caucasian lady seen today for initial office consultation visit for a TIA.  History is obtained from the patient and review of referral notes as well as have personally reviewed imaging films in PACS.  Patient states on 09/16/2017 she was not feeling well most of the day when later on in the afternoon she developed sudden onset of difficulty speaking as well as numbness involving the right face and the body.  This evolved subsequently to tingling sensation on the right cheek arm and leg.  The patient was seen at [facility] where a CT scan of the head was obtained which showed no acute abnormality and CT angiogram was also obtained which showed no significant large vessel stenosis in the neck of the brain.  Carotid ultrasound was also obtained which showed no significant extracranial stenosis.  LDL cholesterol was elevated at 1 1 4  mg percent and hemoglobin A1c was 8.2.  Patient has history of essential thrombocytosis and is followed by hematologist.  She was started on aspirin 81 mg daily as well as lovastatin.  She states she is tolerating both medications well without bruising bleeding or muscle aches or pains.  Her blood pressure is well controlled today it is 11 6/71.  The patient states that her speech difficulties resolved fairly quickly but she still had numbness which took more than a month to resolve.  For unclear reason she never had an MRI scan of the brain or an echocardiogram performed.  The patient does have strong family history of strokes in both her dad as well as grandfather.  The patient does admit to snoring and has gained weight and may be at risk for sleep apnea but she  has never had a sleep study done so far.  She states she has very poor vision in both eyes secondary to diabetic retinopathy.  She however denies any symptoms of diabetic neuropathy.  She has no prior history of TIA, stroke, seizures or other neurological problems Update 04/26/2018 : He returns for follow-up after last visit 3 months ago.  She states she is doing well without any recurrent episodes of numbness, TIAs or strokes.  I had ordered MRI scan of the brain which she has not done.  She was also asked to do lab work which have also not been done.  She underwent polysomnogram sleep study on 04/10/2018 which did show mild obstructive sleep apnea.  She has seen Dr. Rexene Alberts but has not yet been scheduled for CPAP titration.  She states she did have some lab work at primary care physician's office 3 weeks ago which showed a hemoglobin A1c of 8.7 but I do not have those results.  She remains on aspirin which is tolerating well without bleeding or bruising.  Her blood pressure is well controlled today it is 126/89.  She is tolerating lovastatin well without muscle aches and pains.  She has no new complaints today. ROS:   14 system review of systems is positive for no complaints today all other systems negative  PMH:  Past Medical History:  Diagnosis Date  . ADHD   . Arthralgia   . Depression   . Diabetes mellitus without  complication ([place])   . Diabetic retinopathy ([place])   . Glaucoma   . Hyperlipidemia   . Hypertension   . Macular edema   . Visual impairment     Social History:  Social History   Socioeconomic History  . Marital status: Married    Spouse name: Not on file  . Number of children: Not on file  . Years of education: Not on file  . Highest education level: Not on file  Occupational History  . Not on file  Social Needs  . Financial resource strain: Not on file  . Food insecurity:    Worry: Not on file    Inability: Not on file  . Transportation needs:    Medical: Not on file      Non-medical: Not on file  Tobacco Use  . Smoking status: Never Smoker  . Smokeless tobacco: Never Used  Substance and Sexual Activity  . Alcohol use: Not Currently  . Drug use: Not Currently  . Sexual activity: Not on file  Lifestyle  . Physical activity:    Days per week: Not on file    Minutes per session: Not on file  . Stress: Not on file  Relationships  . Social connections:    Talks on phone: Not on file    Gets together: Not on file    Attends religious service: Not on file    Active member of club or organization: Not on file    Attends meetings of clubs or organizations: Not on file    Relationship status: Not on file  . Intimate partner violence:    Fear of current or ex partner: Not on file    Emotionally abused: Not on file    Physically abused: Not on file    Forced sexual activity: Not on file  Other Topics Concern  . Not on file  Social History Narrative  . Not on file    Medications:   Current Outpatient Medications on File Prior to Visit  Medication Sig Dispense Refill  . acetaminophen (TYLENOL) 325 MG tablet Take 650 mg by mouth.    Marland Kitchen albuterol (PROVENTIL HFA;VENTOLIN HFA) 108 (90 Base) MCG/ACT inhaler Inhale into the lungs every 6 (six) hours as needed for wheezing or shortness of breath.    . amphetamine-dextroamphetamine (ADDERALL) 20 MG tablet 20 mg 2 (two) times daily.     Marland Kitchen aspirin EC 81 MG tablet Take by mouth.    Marland Kitchen buPROPion (WELLBUTRIN SR) 150 MG 12 hr tablet Take 150 mg by mouth 2 (two) times daily.  1  . dorzolamide-timolol (COSOPT) 22.3-6.8 MG/ML ophthalmic solution Place 1 drop into both eyes 2 times daily.    . DULoxetine (CYMBALTA) 60 MG capsule Take 60 mg by mouth daily.  1  . hydrOXYzine (ATARAX/VISTARIL) 25 MG tablet 3 times daily.    . insulin aspart (NOVOLOG) cartridge Inject 10 Units into the skin.     Marland Kitchen latanoprost (XALATAN) 0.005 % ophthalmic solution INSTILL 1 DROP INTO EACH EYE NIGHTLY  11  . lisinopril-hydrochlorothiazide  (PRINZIDE,ZESTORETIC) 20-12.5 MG tablet Take 1 tablet by mouth daily.    Marland Kitchen loratadine (CLARITIN) 10 MG tablet Take 10 mg by mouth.    . lovastatin (MEVACOR) 20 MG tablet Take by mouth.    . metFORMIN (GLUCOPHAGE-XR) 500 MG 24 hr tablet Take 100 mg by mouth 2 (two) times daily.    . polyvinyl alcohol (LIQUIFILM TEARS) 1.4 % ophthalmic solution Place 1 drop into both eyes 2 times daily.    Marland Kitchen  rOPINIRole (REQUIP) 0.5 MG tablet Take 0.5 mg by mouth at bedtime.     No current facility-administered medications on file prior to visit.     Allergies:   Allergies  Allergen Reactions  . Pb-Hyoscy-Atropine-Scopolamine   . Canagliflozin Rash  . Latex Rash    Blistery rash  . Pb-Hyoscy-Atropine-Scopolamine Rash  . Sulfamethoxazole Itching    Physical Exam General: well developed, well nourished, middle aged  Caucasian lady seated, in no evident distress Head: head normocephalic and atraumatic.   Neck: supple with no carotid or supraclavicular bruits Cardiovascular: regular rate and rhythm, no murmurs Musculoskeletal: no deformity Skin:  no rash/petichiae Vascular:  Normal pulses all extremities  Neurologic Exam Mental Status: Awake and fully alert. Oriented to place and time. Recent and remote memory intact. Attention span, concentration and fund of knowledge appropriate. Mood and affect appropriate.  Cranial Nerves: Fundoscopic exam not  done. Pupils equal, briskly reactive to light.  Vision acuity is limited bilaterally due to her diabetic retinopathy and she states she is legally blind but she was able to cooperate fully with visual field testing which seemed adequate .extraocular movements full without nystagmus. Visual fields full to confrontation. Hearing intact. Facial sensation intact. Face, tongue, palate moves normally and symmetrically.  Motor: Normal bulk and tone. Normal strength in all tested extremity muscles. Sensory.: intact to touch , pinprick , position and vibratory sensation.   Coordination: Rapid alternating movements normal in all extremities. Finger-to-nose and heel-to-shin performed accurately bilaterally. Gait and Station: Arises from chair without difficulty. Stance is normal. Gait demonstrates normal stride length and balance . Able to heel, toe and tandem walk without difficulty.  Reflexes: 1+ and symmetric. Toes downgoing.      ASSESSMENT: 54 year old lady with episode of right-sided numbness and difficulty speaking likely small left brain infarct versus TIA from lacunar disease.  Vascular risk factors of hypertension, diabetes, and essential thrombocytosis, obstructive sleep apnoeaand hyperlipidemia.    PLAN: I had a long d/w patient about her TIA, new diagnosis of sleep apnoea risk for recurrent stroke/TIAs, personally independently reviewed imaging studies and stroke evaluation results and answered questions.Continue aspirin 81 mg daily  for secondary stroke prevention and maintain strict control of hypertension with blood pressure goal below 130/90, diabetes with hemoglobin A1c goal below 6.5% and lipids with LDL cholesterol goal below 70 mg/dL. I also advised the patient to eat a healthy diet with plenty of whole grains, cereals, fruits and vegetables, exercise regularly and maintain ideal body weight.  I advised her to follow-up with Dr. Rexene Alberts for treatment for her sleep apnea with CPAP.  Check MRI of the brain as of unclear reason it has not been done.  Followup in the future with me only as necessary. Greater than 50% time during this 25-minute   visit was spent on counseling and coordination of care about her TIA and lacunar disease and discussion about stroke prevention and treatment and answering questions.   Antony Contras, MD  [facility] [address] [place], [place] [address]  Phone [phone] Fax [phone] Note: This document was prepared with digital dictation and possible smart phrase technology. Any  transcriptional errors that result from this process are unintentional.

## 2018-04-26 NOTE — Telephone Encounter (Signed)
I called Jill [name]. I advised Jill [name] that Dr. Rexene Alberts reviewed their sleep study results and found that Jill [name] has mild or borderline osa, much more pronounced during REM sleep. Dr. Rexene Alberts recommends that Jill [name] start an auto pap at home. I reviewed PAP compliance expectations with the Jill [name]. Jill [name] is agreeable to starting an auto-PAP. I advised Jill [name] that an order will be sent to a DME, [facility], and [facility] will call the Jill [name] within about one week after they file with the Jill [name]'s insurance. [facility] will show the Jill [name] how to use the machine, fit for masks, and troubleshoot the auto-PAP if needed. A follow up appt was made for insurance purposes with Dr. Rexene Alberts on 07/11/18 at 9:30am. Jill [name] verbalized understanding to arrive 15 minutes early and bring their auto-PAP. A letter with all of this information in it will be mailed to the Jill [name] as a reminder. I verified with the Jill [name] that the address we have on file is correct. Jill [name] verbalized understanding of results. Jill [name] had no questions at this time but was encouraged to call back if questions arise. I have sent the order to [facility] and have received confirmation that they have received the order.

## 2018-04-26 NOTE — Telephone Encounter (Signed)
See telephone note from 04/26/18.

## 2018-04-29 ENCOUNTER — Ambulatory Visit
Admission: RE | Admit: 2018-04-29 | Discharge: 2018-04-29 | Disposition: A | Discharge status: Home / Self Care | Payer: Medicare Other | Source: Ambulatory Visit | Attending: Neurology | Admitting: Neurology

## 2018-04-29 DIAGNOSIS — G459 Transient cerebral ischemic attack, unspecified: Secondary | ICD-10-CM

## 2018-04-29 DIAGNOSIS — I6381 Other cerebral infarction due to occlusion or stenosis of small artery: Secondary | ICD-10-CM

## 2018-04-29 MED ORDER — GADOBENATE DIMEGLUMINE 529 MG/ML IV SOLN
18.0000 mL | Freq: Once | INTRAVENOUS | Status: AC | PRN
  Administered 2018-04-29: 18 mL via INTRAVENOUS

## 2018-05-01 ENCOUNTER — Telehealth: Payer: Self-pay

## 2018-05-01 NOTE — Telephone Encounter (Signed)
I contacted the pt and was able to review MRI results per Dr. Leonie Man. Pt verbalized understanding and had no questions/concerns.

## 2018-05-01 NOTE — Telephone Encounter (Signed)
Notes recorded by Garvin Fila, MD on 04/30/2018 at 5:22 PM EST Kindly inform the patient that MRI scan of the brain shows mild changes of hardening of the tiny blood vessels in the brain which is related to her diabetes, hypertension and obesity. No new or surprising findings

## 2018-05-14 DIAGNOSIS — E113513 Type 2 diabetes mellitus with proliferative diabetic retinopathy with macular edema, bilateral: Secondary | ICD-10-CM | POA: Diagnosis not present

## 2018-05-14 DIAGNOSIS — H40053 Ocular hypertension, bilateral: Secondary | ICD-10-CM | POA: Diagnosis not present

## 2018-05-14 DIAGNOSIS — H3589 Other specified retinal disorders: Secondary | ICD-10-CM | POA: Diagnosis not present

## 2018-05-14 DIAGNOSIS — H59032 Cystoid macular edema following cataract surgery, left eye: Secondary | ICD-10-CM | POA: Diagnosis not present

## 2018-05-14 DIAGNOSIS — Z794 Long term (current) use of insulin: Secondary | ICD-10-CM | POA: Diagnosis not present

## 2018-06-07 DIAGNOSIS — J0100 Acute maxillary sinusitis, unspecified: Secondary | ICD-10-CM | POA: Diagnosis not present

## 2018-06-28 ENCOUNTER — Other Ambulatory Visit: Payer: Self-pay

## 2018-06-28 ENCOUNTER — Emergency Department (HOSPITAL_COMMUNITY)
Admission: EM | Admit: 2018-06-28 | Discharge: 2018-06-29 | Disposition: A | Discharge status: Home / Self Care | Payer: Medicare Other | Attending: Emergency Medicine | Admitting: Emergency Medicine

## 2018-06-28 ENCOUNTER — Encounter (HOSPITAL_COMMUNITY): Payer: Self-pay | Admitting: Emergency Medicine

## 2018-06-28 ENCOUNTER — Emergency Department (HOSPITAL_COMMUNITY): Payer: Medicare Other

## 2018-06-28 DIAGNOSIS — R200 Anesthesia of skin: Secondary | ICD-10-CM | POA: Diagnosis not present

## 2018-06-28 DIAGNOSIS — R509 Fever, unspecified: Secondary | ICD-10-CM | POA: Diagnosis not present

## 2018-06-28 DIAGNOSIS — N3000 Acute cystitis without hematuria: Secondary | ICD-10-CM | POA: Insufficient documentation

## 2018-06-28 DIAGNOSIS — R000 Tachycardia, unspecified: Secondary | ICD-10-CM | POA: Diagnosis not present

## 2018-06-28 DIAGNOSIS — R202 Paresthesia of skin: Secondary | ICD-10-CM | POA: Diagnosis not present

## 2018-06-28 DIAGNOSIS — R05 Cough: Secondary | ICD-10-CM | POA: Diagnosis not present

## 2018-06-28 LAB — COMPREHENSIVE METABOLIC PANEL
ALT: 53 U/L — ABNORMAL HIGH (ref 0–44)
AST: 36 U/L (ref 15–41)
Albumin: 4 g/dL (ref 3.5–5.0)
Alkaline Phosphatase: 206 U/L — ABNORMAL HIGH (ref 38–126)
Anion gap: 13 (ref 5–15)
BUN: 27 mg/dL — ABNORMAL HIGH (ref 6–20)
CO2: 21 mmol/L — ABNORMAL LOW (ref 22–32)
Calcium: 9.7 mg/dL (ref 8.9–10.3)
Chloride: 100 mmol/L (ref 98–111)
Creatinine, Ser: 1.54 mg/dL — ABNORMAL HIGH (ref 0.44–1.00)
GFR calc Af Amer: 45 mL/min — ABNORMAL LOW (ref >60)
GFR calc non Af Amer: 39 mL/min — ABNORMAL LOW (ref >60)
Glucose, Bld: 83 mg/dL (ref 70–99)
Potassium: 5.1 mmol/L (ref 3.5–5.1)
Sodium: 134 mmol/L — ABNORMAL LOW (ref 135–145)
Total Bilirubin: 0.6 mg/dL (ref 0.3–1.2)
Total Protein: 7.7 g/dL (ref 6.5–8.1)

## 2018-06-28 LAB — CBC
HCT: 38.2 % (ref 36.0–46.0)
Hemoglobin: 12.1 g/dL (ref 12.0–15.0)
MCH: 26.8 pg (ref 26.0–34.0)
MCHC: 31.7 g/dL (ref 30.0–36.0)
MCV: 84.7 fL (ref 80.0–100.0)
Platelets: 153 10*3/uL (ref 150–400)
RBC: 4.51 MIL/uL (ref 3.87–5.11)
RDW: 13.2 % (ref 11.5–15.5)
WBC: 10 10*3/uL (ref 4.0–10.5)
nRBC: 0 % (ref 0.0–0.2)

## 2018-06-28 LAB — CBG MONITORING, ED
Glucose-Capillary: 128 mg/dL — ABNORMAL HIGH (ref 70–99)
Glucose-Capillary: 136 mg/dL — ABNORMAL HIGH (ref 70–99)
Glucose-Capillary: 66 mg/dL — ABNORMAL LOW (ref 70–99)

## 2018-06-28 LAB — DIFFERENTIAL
Abs Immature Granulocytes: 0.04 10*3/uL (ref 0.00–0.07)
Basophils Absolute: 0 10*3/uL (ref 0.0–0.1)
Basophils Relative: 0 %
Eosinophils Absolute: 0.3 10*3/uL (ref 0.0–0.5)
Eosinophils Relative: 3 %
Immature Granulocytes: 0 %
Lymphocytes Relative: 37 %
Lymphs Abs: 3.7 10*3/uL (ref 0.7–4.0)
Monocytes Absolute: 0.6 10*3/uL (ref 0.1–1.0)
Monocytes Relative: 6 %
Neutro Abs: 5.3 10*3/uL (ref 1.7–7.7)
Neutrophils Relative %: 54 %

## 2018-06-28 IMAGING — DX PORTABLE CHEST - 1 VIEW
1 series · 1 of 1 positions shown · non-contrast
Comparison: [DATE]

CLINICAL DATA: Weakness, runny nose, cough

EXAM:
PORTABLE CHEST 1 VIEW

[chest]
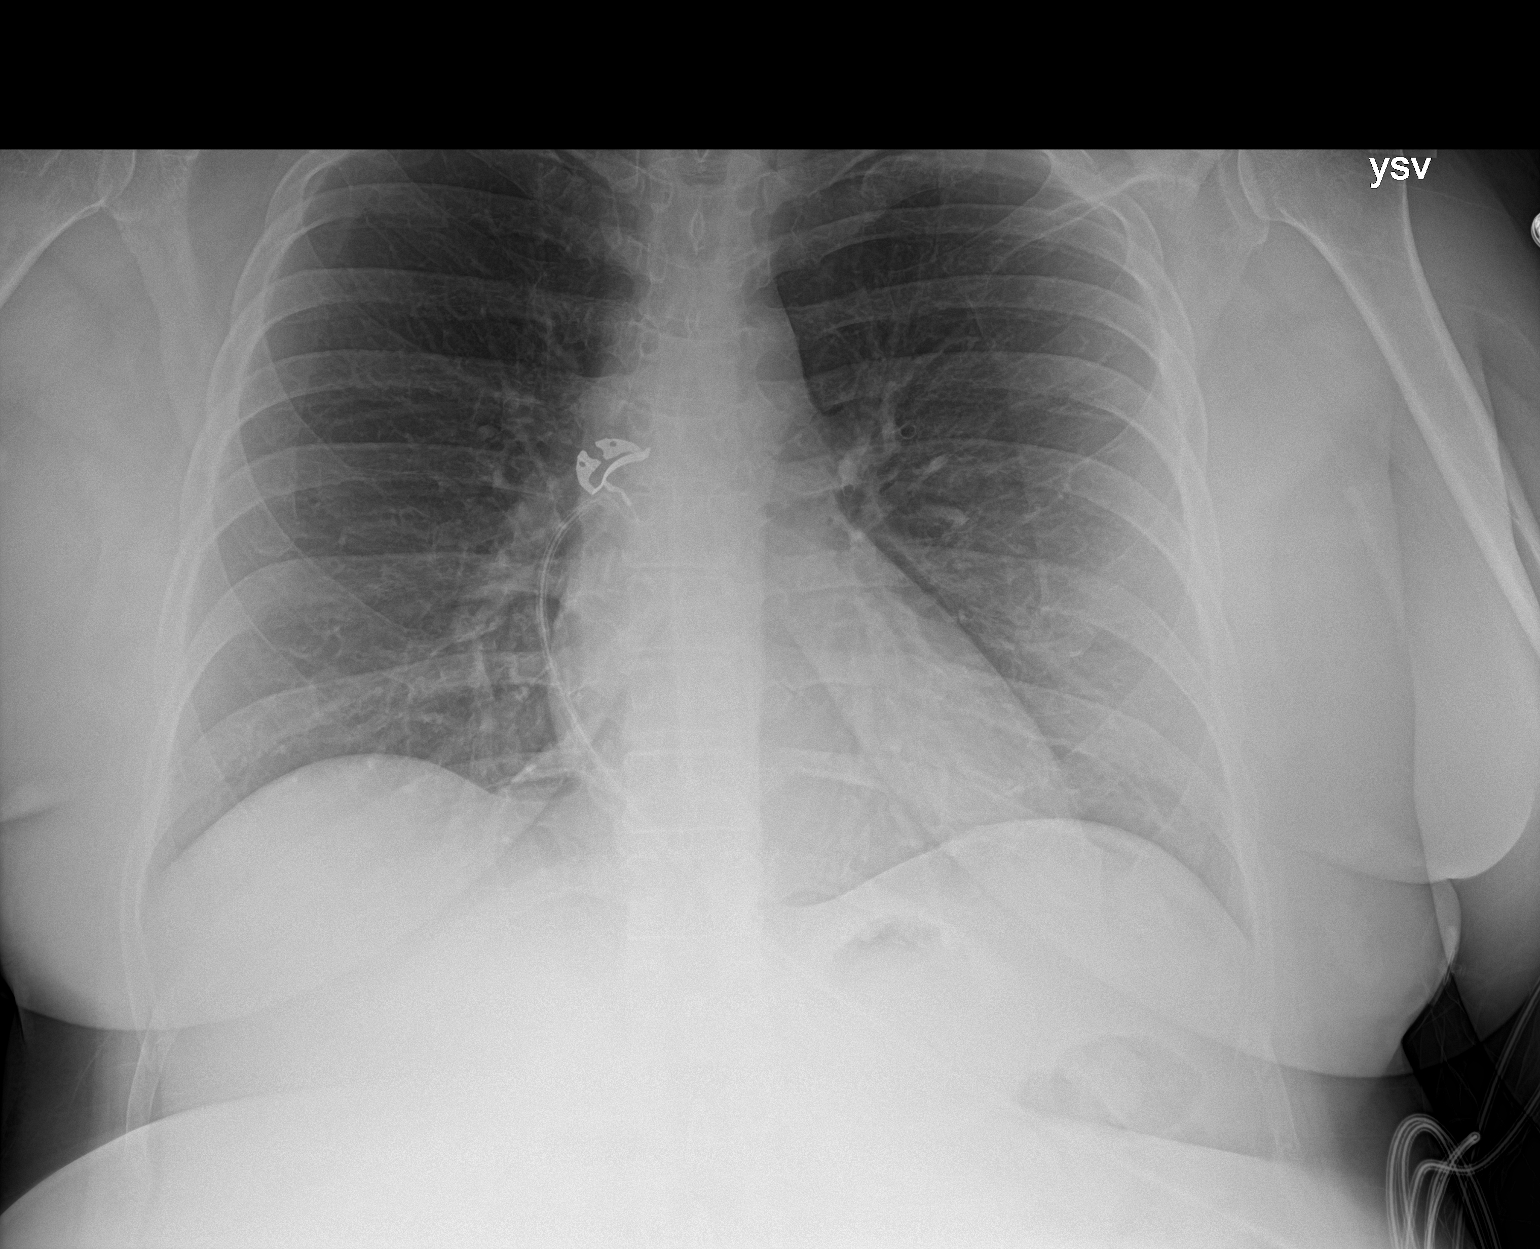

[1 of 1 positions shown; findings below may reference images not displayed]

FINDINGS: Lungs are clear.  No pleural effusion or pneumothorax.

The heart is normal in size.
IMPRESSION: No evidence of acute cardiopulmonary disease.

## 2018-06-28 IMAGING — CT CT HEAD WITHOUT CONTRAST
4 series · 15 of 47 positions shown, 17 images · non-contrast
Comparison: Prior MRI from [DATE].

CLINICAL DATA: Initial evaluation for acute right facial numbness.

EXAM:
CT HEAD WITHOUT CONTRAST
TECHNIQUE: Contiguous axial images were obtained from the base of the skull
through the vertex without intravenous contrast.

[Series 3: head wo · axial · 0.40mm/px · z∈[+1176,+1296]mm · 7 of 32 slices shown, 9 images]
[im 4/32  brain]
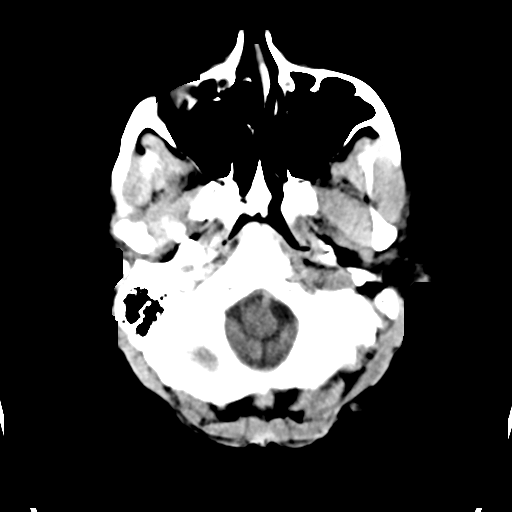
[im 4/32  bone]
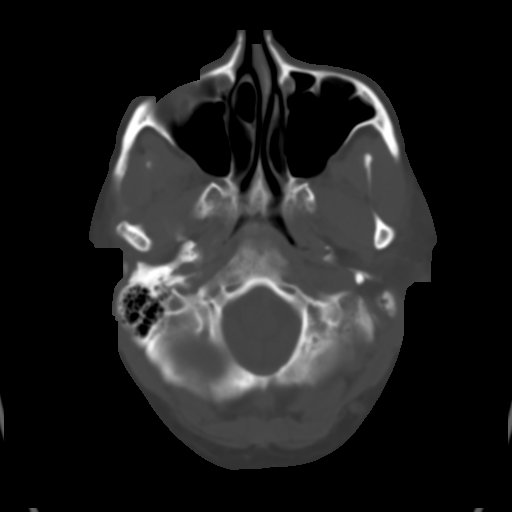
[im 8/32  brain]
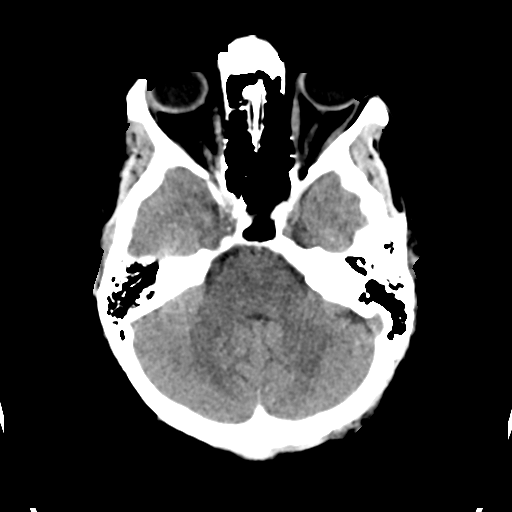
[im 12/32  brain]
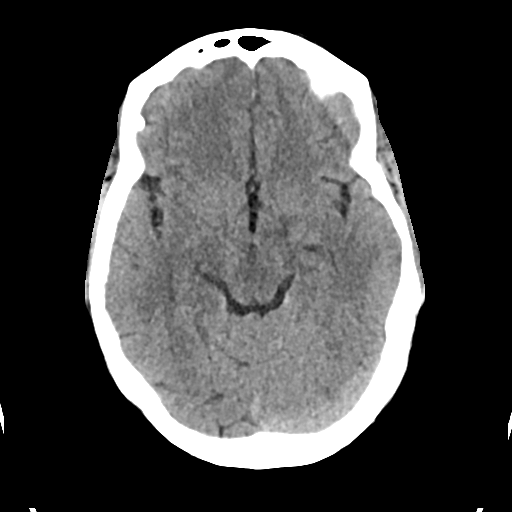
[im 16/32  brain]
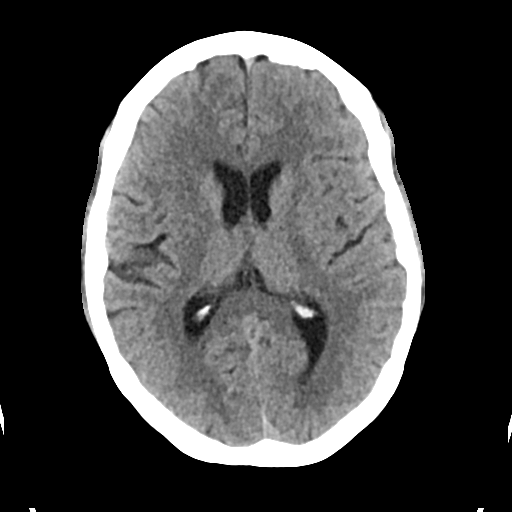
[im 20/32  brain]
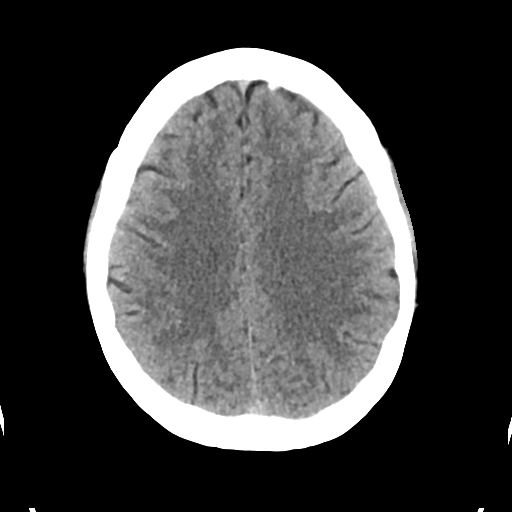
[im 20/32  bone]
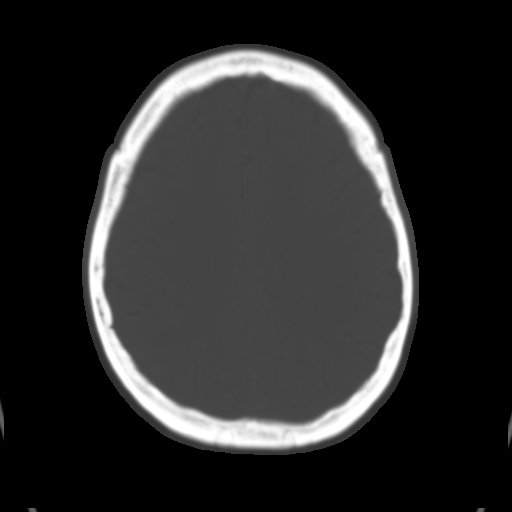
[im 24/32  brain]
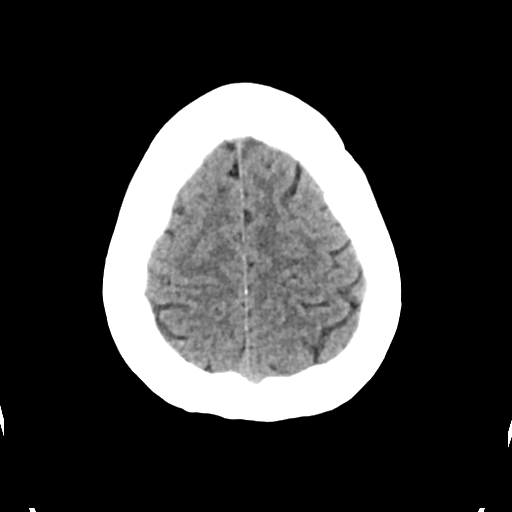
[im 28/32  brain]
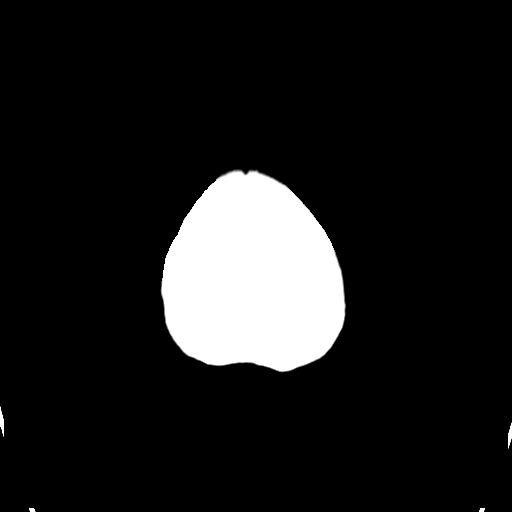

[Series 4: head bone · axial · 0.40mm/px · z∈[+1175,+1191]mm · 2 of 80 slices shown]
[im 8/80  bone]
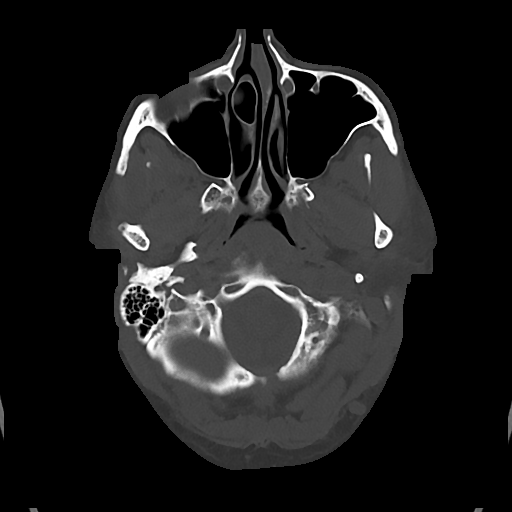
[im 16/80  bone]
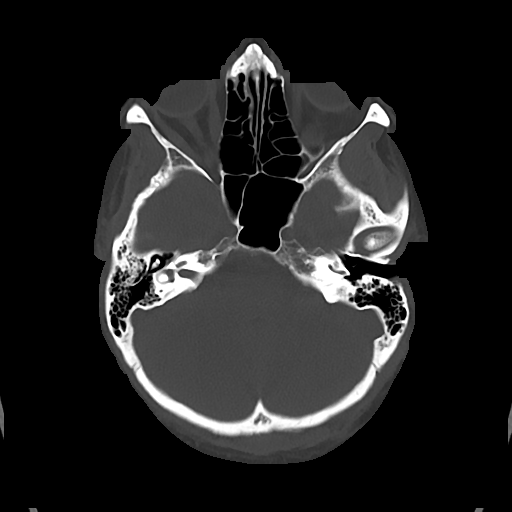

[Series 5: cor soft · coronal · 0.31mm/px · 3 of 73 slices shown]
[im 25/73  brain]
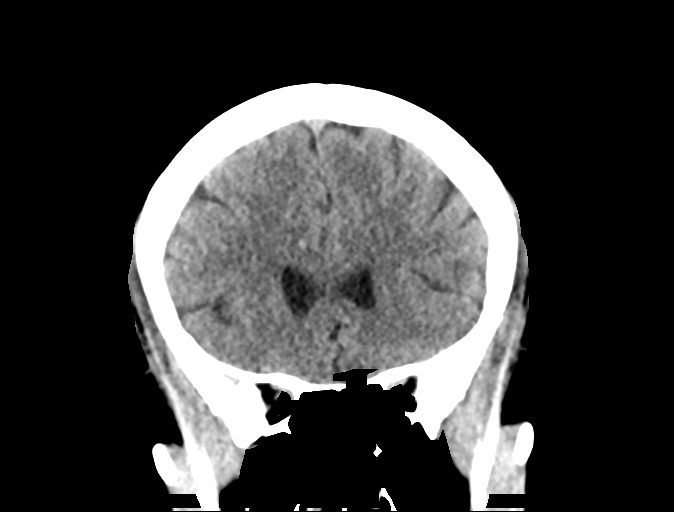
[im 33/73  brain]
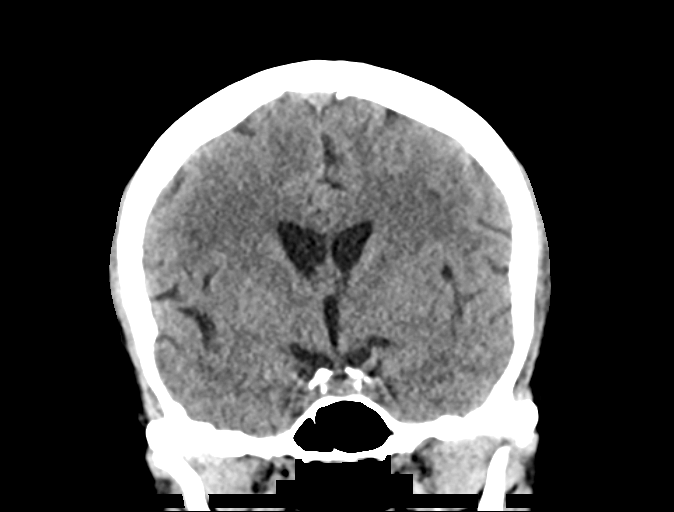
[im 41/73  brain]
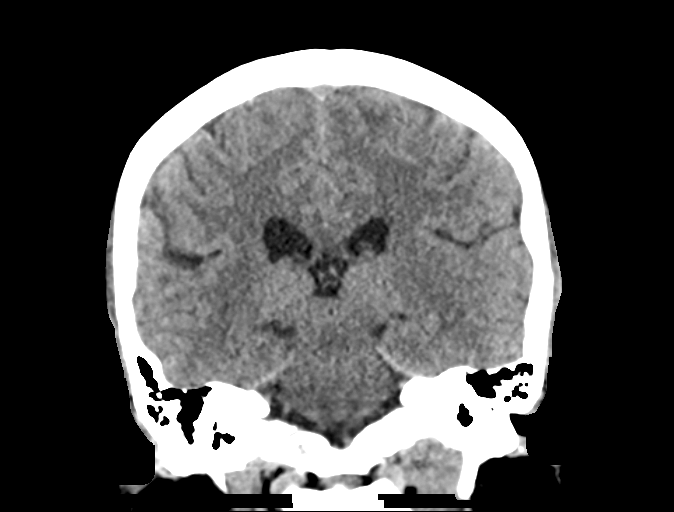

[Series 6: sag soft · sagittal · 0.31mm/px · 3 of 67 slices shown]
[im 23/67  brain]
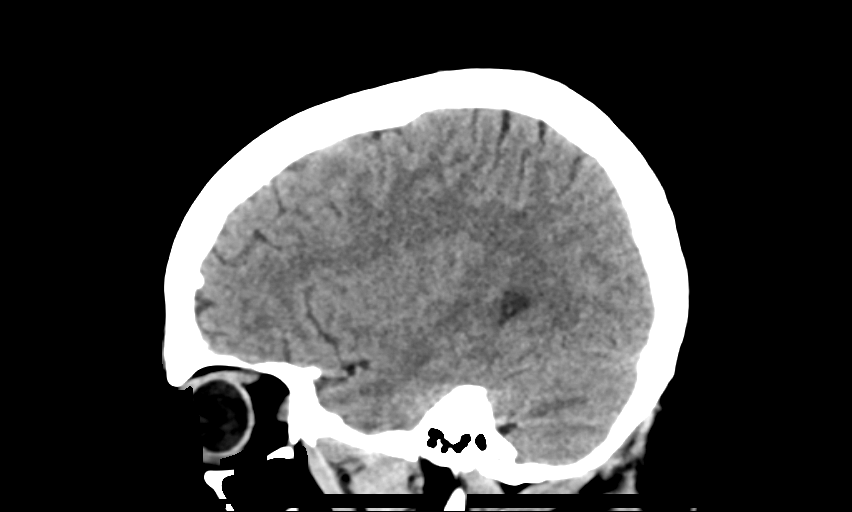
[im 34/67  brain]
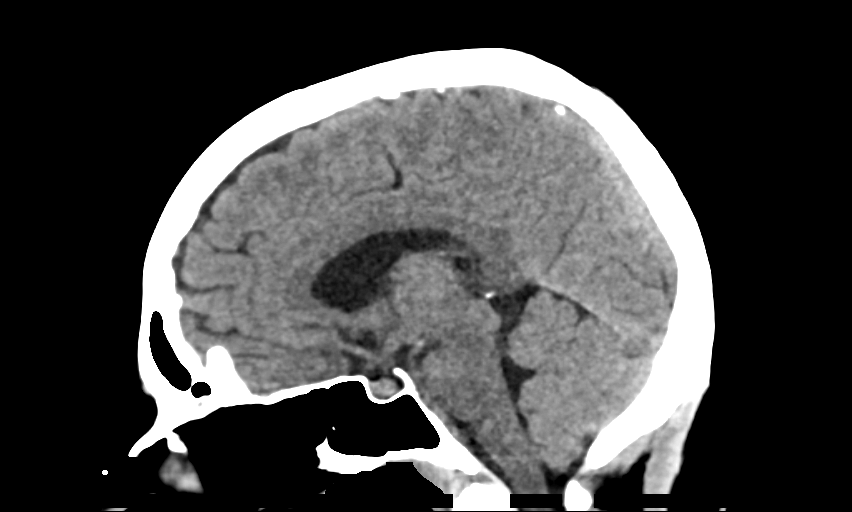
[im 45/67  brain]
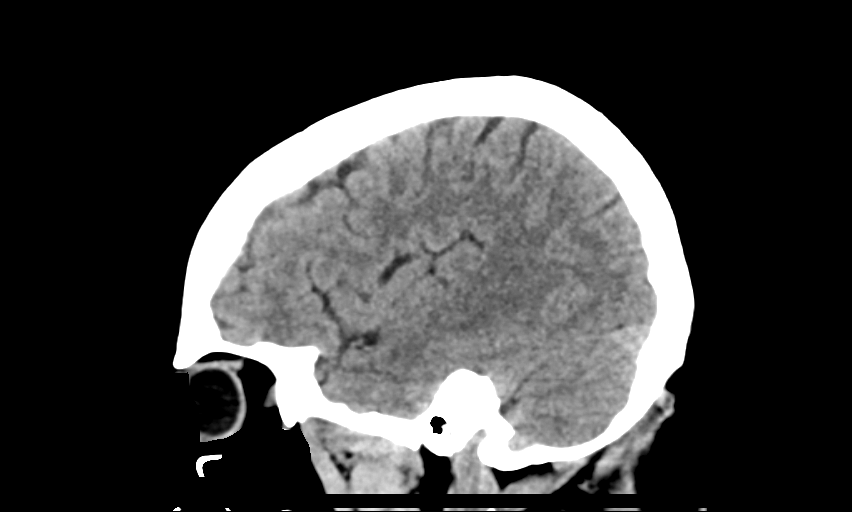

[15 of 47 positions shown; findings below may reference images not displayed]

FINDINGS: Brain: Cerebral volume within normal limits for patient age.

No evidence for acute intracranial hemorrhage. No findings to
suggest acute large vessel territory infarct. No mass lesion,
midline shift, or mass effect. Ventricles are normal in size without
evidence for hydrocephalus. No extra-axial fluid collection
identified.

Vascular: No hyperdense vessel identified.Scattered vascular
calcifications noted within the carotid siphons.

Skull: Scalp soft tissues demonstrate no acute abnormality.
Calvarium intact.

Sinuses/Orbits: Globes and orbital soft tissues within normal
limits.

Visualized paranasal sinuses are clear. No mastoid effusion.
IMPRESSION: Negative head CT.  No acute intracranial abnormality identified.

## 2018-06-28 MED ORDER — SODIUM CHLORIDE 0.9% FLUSH
3.0000 mL | Freq: Once | INTRAVENOUS | Status: AC
  Administered 2018-06-28: 23:00:00 3 mL via INTRAVENOUS

## 2018-06-28 NOTE — ED Triage Notes (Signed)
Pt reports she had numbness to the R side of her face starting at 4 pm this afternoon. Pt reports she felt like she had difficulty forming her words and holding her pen. Pt has no drift or weakness. Pt reports hx of TIA

## 2018-06-28 NOTE — ED Provider Notes (Signed)
[facility] EMERGENCY DEPARTMENT Provider Note   CSN: [id number] Arrival date & time: 06/28/18  2132    History   Chief Complaint Chief Complaint  Patient presents with   Numbness    HPI Jill [name] is a 53 y.o. female.     The history is provided by the patient.  Extremity Weakness  This is a new problem. The current episode started 12 to 24 hours ago. The problem occurs constantly. The problem has been gradually improving. Pertinent negatives include no chest pain, no abdominal pain, no headaches and no shortness of breath. Nothing aggravates the symptoms. She has tried nothing for the symptoms. The treatment provided moderate relief.  Right facial numbness and difficulty with speech and holding a pen.  Has also has fevers on and off x 2 weeks to 100.4.  No cough, no anosmia.  No travel.  No SOB no sick contacts.    Past Medical History:  Diagnosis Date   ADHD    Arthralgia    Depression    Diabetes mellitus without complication ([place])    Diabetic retinopathy ([place])    Glaucoma    Hyperlipidemia    Hypertension    Macular edema    Visual impairment     Patient Active Problem List   Diagnosis Date Noted   DIABETES MELLITUS, TYPE II 01/01/2007   HYPERLIPIDEMIA 01/01/2007   HYPERTENSION 01/01/2007   PERIPHERAL VASCULAR DISEASE 01/01/2007    Past Surgical History:  Procedure Laterality Date   CATARACT EXTRACTION     left eye   CESAREAN SECTION     left foot surgery       OB History   No obstetric history on file.      Home Medications    Prior to Admission medications   Medication Sig Start Date End Date Taking? Authorizing Provider  acetaminophen (TYLENOL) 325 MG tablet Take 650 mg by mouth. 10/03/14   [provider]  albuterol (PROVENTIL HFA;VENTOLIN HFA) 108 (90 Base) MCG/ACT inhaler Inhale into the lungs every 6 (six) hours as needed for wheezing or shortness of breath.    [provider]   amphetamine-dextroamphetamine (ADDERALL) 20 MG tablet 20 mg 2 (two) times daily.  09/02/17   [provider]  aspirin EC 81 MG tablet Take by mouth.    [provider]  buPROPion (WELLBUTRIN SR) 150 MG 12 hr tablet Take 150 mg by mouth 2 (two) times daily. 01/27/18   [provider]  dorzolamide-timolol (COSOPT) 22.3-6.8 MG/ML ophthalmic solution Place 1 drop into both eyes 2 times daily. 08/03/17   [provider]  DULoxetine (CYMBALTA) 60 MG capsule Take 60 mg by mouth daily. 12/22/17   [provider]  hydrOXYzine (ATARAX/VISTARIL) 25 MG tablet 3 times daily. 07/27/17   [provider]  insulin aspart (NOVOLOG) cartridge Inject 10 Units into the skin.     [provider]  latanoprost (XALATAN) 0.005 % ophthalmic solution INSTILL 1 DROP INTO EACH EYE NIGHTLY 11/10/17   [provider]  lisinopril-hydrochlorothiazide (PRINZIDE,ZESTORETIC) 20-12.5 MG tablet Take 1 tablet by mouth daily.    [provider]  loratadine (CLARITIN) 10 MG tablet Take 10 mg by mouth.    [provider]  lovastatin (MEVACOR) 20 MG tablet Take by mouth.    [provider]  metFORMIN (GLUCOPHAGE-XR) 500 MG 24 hr tablet Take 100 mg by mouth 2 (two) times daily.    [provider]  polyvinyl alcohol (LIQUIFILM TEARS) 1.4 %  ophthalmic solution Place 1 drop into both eyes 2 times daily.    [provider]  rOPINIRole (REQUIP) 0.5 MG tablet Take 0.5 mg by mouth at bedtime. 03/29/18   [provider]    Family History Family History  Problem Relation Age of Onset   Stroke Father    Stroke Paternal Grandfather     Social History Social History   Tobacco Use   Smoking status: Never Smoker   Smokeless tobacco: Never Used  Substance Use Topics   Alcohol use: Not Currently   Drug use: Not Currently     Allergies   Pb-hyoscy-atropine-scopolamine; Canagliflozin; Latex;  Pb-hyoscy-atropine-scopolamine; and Sulfamethoxazole   Review of Systems Review of Systems  Constitutional: Positive for fever.  HENT: Negative for congestion and sore throat.   Eyes: Negative for photophobia and visual disturbance.  Respiratory: Negative for shortness of breath.   Cardiovascular: Negative for chest pain.  Gastrointestinal: Negative for abdominal pain, diarrhea and nausea.  Musculoskeletal: Positive for extremity weakness. Negative for myalgias.  Neurological: Positive for speech difficulty, weakness and numbness. Negative for facial asymmetry and headaches.  All other systems reviewed and are negative.    Physical Exam Updated Vital Signs BP (!) 151/60    Pulse (!) 108    Temp 98.7 F (37.1 C) (Oral)    Resp 16    SpO2 100%   Physical Exam Vitals signs and nursing note reviewed.  Constitutional:      General: She is not in acute distress.    Appearance: She is normal weight.  HENT:     Head: Normocephalic and atraumatic.     Nose: Nose normal.     Mouth/Throat:     Mouth: Mucous membranes are moist.  Eyes:     Extraocular Movements: Extraocular movements intact.     Conjunctiva/sclera: Conjunctivae normal.     Pupils: Pupils are equal, round, and reactive to light.  Neck:     Musculoskeletal: Normal range of motion and neck supple.  Cardiovascular:     Rate and Rhythm: Normal rate and regular rhythm.     Pulses: Normal pulses.     Heart sounds: Normal heart sounds.  Pulmonary:     Effort: Pulmonary effort is normal.     Breath sounds: Normal breath sounds.  Abdominal:     General: Abdomen is flat. Bowel sounds are normal.     Tenderness: There is no abdominal tenderness.  Musculoskeletal: Normal range of motion.  Skin:    General: Skin is warm and dry.     Capillary Refill: Capillary refill takes less than 2 seconds.  Neurological:     General: No focal deficit present.     Mental Status: She is alert and oriented to person, place, and time.      Cranial Nerves: No cranial nerve deficit.     Motor: No weakness.     Deep Tendon Reflexes: Reflexes normal.  Psychiatric:        Mood and Affect: Mood normal.        Behavior: Behavior normal.      ED Treatments / Results  Labs (all labs ordered are listed, but only abnormal results are displayed) Labs Reviewed  COMPREHENSIVE METABOLIC PANEL - Abnormal; Notable for the following components:      Result Value   Sodium 134 (*)    CO2 21 (*)    BUN 27 (*)    Creatinine, Ser 1.54 (*)    ALT 53 (*)    Alkaline  Phosphatase 206 (*)    GFR calc non Af Amer 39 (*)    GFR calc Af Amer 45 (*)    All other components within normal limits  CBG MONITORING, ED - Abnormal; Notable for the following components:   Glucose-Capillary 66 (*)    All other components within normal limits  CBG MONITORING, ED - Abnormal; Notable for the following components:   Glucose-Capillary 136 (*)    All other components within normal limits  CBC  DIFFERENTIAL    EKG EKG Interpretation  Date/Time:  Thursday Miguelina Fore 02 2020 20:48:11 EDT Ventricular Rate:  108 PR Interval:  122 QRS Duration: 74 QT Interval:  350 QTC Calculation: 469 R Axis:   73 Text Interpretation:  Sinus tachycardia Confirmed by Dory Horn) on 06/28/2018 11:08:46 PM   Radiology Ct Head Wo Contrast  Result Date: 06/28/2018 CLINICAL DATA:  Initial evaluation for acute right facial numbness. EXAM: CT HEAD WITHOUT CONTRAST TECHNIQUE: Contiguous axial images were obtained from the base of the skull through the vertex without intravenous contrast. COMPARISON:  Prior MRI from 04/29/2018. FINDINGS: Brain: Cerebral volume within normal limits for patient age. No evidence for acute intracranial hemorrhage. No findings to suggest acute large vessel territory infarct. No mass lesion, midline shift, or mass effect. Ventricles are normal in size without evidence for hydrocephalus. No extra-axial fluid collection identified. Vascular: No  hyperdense vessel identified.Scattered vascular calcifications noted within the carotid siphons. Skull: Scalp soft tissues demonstrate no acute abnormality. Calvarium intact. Sinuses/Orbits: Globes and orbital soft tissues within normal limits. Visualized paranasal sinuses are clear. No mastoid effusion. IMPRESSION: Negative head CT.  No acute intracranial abnormality identified. Electronically Signed   By: Jeannine Boga M.D.   On: 06/28/2018 22:39    Procedures Procedures (including critical care time)  Medications Ordered in ED Medications  sodium chloride flush (NS) 0.9 % injection 3 mL (3 mLs Intravenous Given 06/28/18 2259)     Case d/w Dr. Leonel Ramsay on phone, MRI if negative stable for discharge if positive admit.  If negative likely atypical migraine.   Will give self quarantine precautions for 14 days, as I am unsure fever can be explained by UTI  Final Clinical Impressions(s) / ED Diagnoses   Return for intractable cough, coughing up blood,fevers >100.4 unrelieved by medication, severeshortness of breath, intractable vomiting, chest pain,turning blue, loss of consciousness, weakness,numbness, changes in speech, facial asymmetry,abdominal pain, passing out,Inability to tolerate liquids or food, cough, altered mental status or any concerns. No signs of systemic illness or infection. The patient is nontoxic-appearing on exam and vital signs are within normal limits.   I have reviewed the triage vital signs and the nursing notes. Pertinent labs &imaging results that were available during my care of the patient were reviewed by me and considered in my medical decision making (see chart for details).  After history, exam, and medical workup I feel the patient has been appropriately medically screened and is safe for discharge home. Pertinent diagnoses were discussed with the patient. Patient was given return precautions.   Jayan Raymundo, MD 06/29/18 [phone]

## 2018-06-28 NOTE — ED Notes (Signed)
Pt states that she has been running fever off and on last two weeks and has had intermittent diarrhea/vomiting as well. MD aware

## 2018-06-29 ENCOUNTER — Encounter (HOSPITAL_COMMUNITY): Payer: Self-pay | Admitting: Emergency Medicine

## 2018-06-29 ENCOUNTER — Emergency Department (HOSPITAL_COMMUNITY): Payer: Medicare Other

## 2018-06-29 DIAGNOSIS — R202 Paresthesia of skin: Secondary | ICD-10-CM | POA: Diagnosis not present

## 2018-06-29 DIAGNOSIS — R05 Cough: Secondary | ICD-10-CM | POA: Diagnosis not present

## 2018-06-29 LAB — URINALYSIS, ROUTINE W REFLEX MICROSCOPIC
Bilirubin Urine: NEGATIVE
Glucose, UA: NEGATIVE mg/dL
Hgb urine dipstick: NEGATIVE
Ketones, ur: NEGATIVE mg/dL
Nitrite: NEGATIVE
Protein, ur: NEGATIVE mg/dL
Specific Gravity, Urine: 1.014 (ref 1.005–1.030)
pH: 5 (ref 5.0–8.0)

## 2018-06-29 IMAGING — MR MRI HEAD WITHOUT CONTRAST
9 of 10 series · 36 of 48 positions shown · non-contrast
Comparison: [DATE]

CLINICAL DATA: Numbness to the right-sided face starting this
afternoon.

EXAM:
MRI HEAD WITHOUT CONTRAST
TECHNIQUE: Multiplanar, multiecho pulse sequences of the brain and surrounding
structures were obtained without intravenous contrast.

[Series 3: DWI · axial · 3.0mm · 1.09mm/px · z∈[-96,+31]mm · 8 of 88 slices shown (1 of 4)]
[im 1/88]
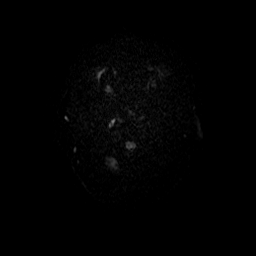
[im 13/88]
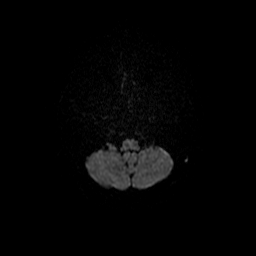
[im 25/88]
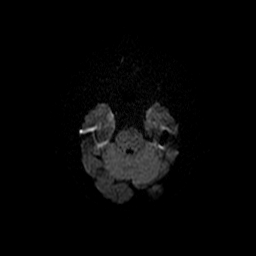
[im 38/88]
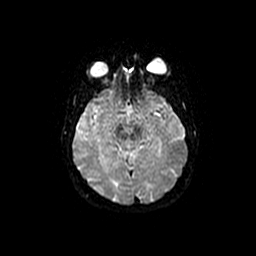
[im 50/88]
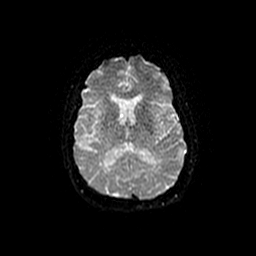
[im 63/88]
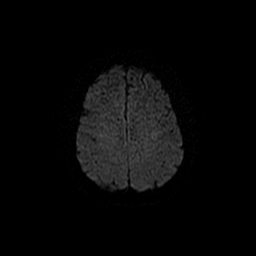
[im 75/88]
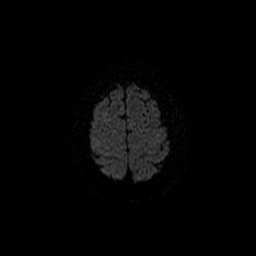
[im 88/88]
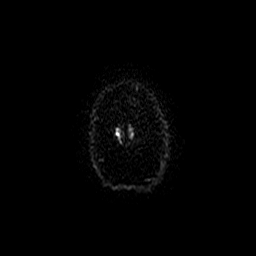

[Series 4: DWI · coronal · 5.0mm · 1.09mm/px · 7 of 67 slices shown (2 of 4)]
[im 1/67]
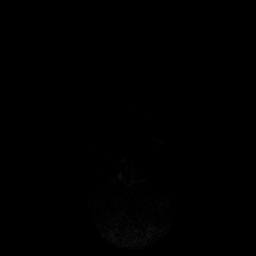
[im 12/67]
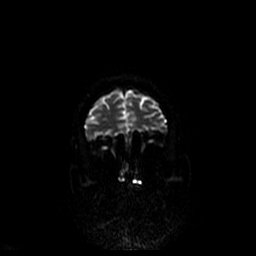
[im 23/67]
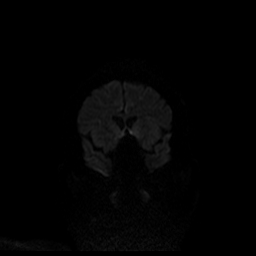
[im 34/67]
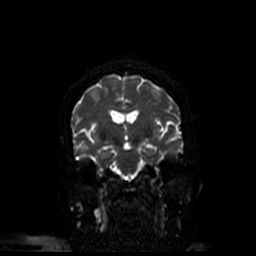
[im 45/67]
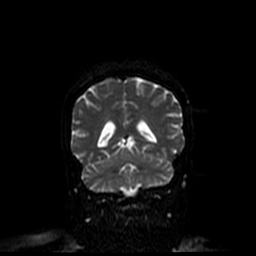
[im 56/67]
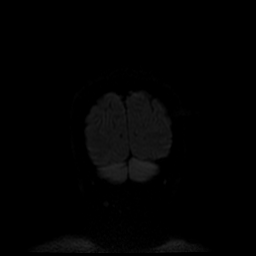
[im 67/67]
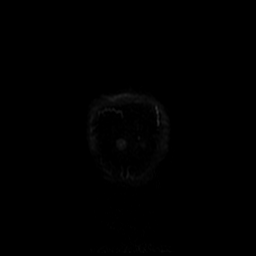

[Series 5: T1 · sagittal · 5.0mm · 0.47mm/px · 2 of 23 slices shown]
[im 1/23]
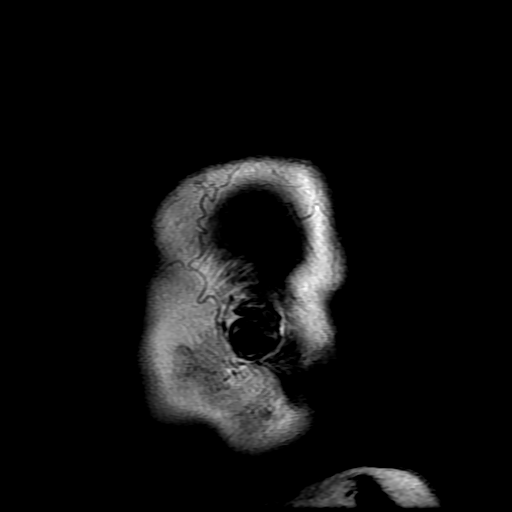
[im 23/23]
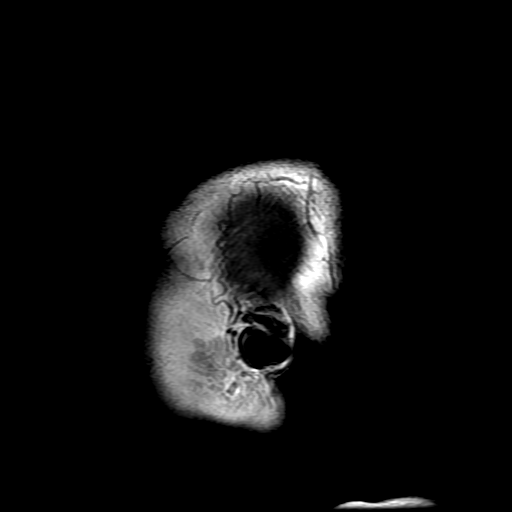

[Series 6: T2 · axial · 5.0mm · 0.43mm/px · z∈[-98,+37]mm · 3 of 24 slices shown (1 of 2)]
[im 1/24]
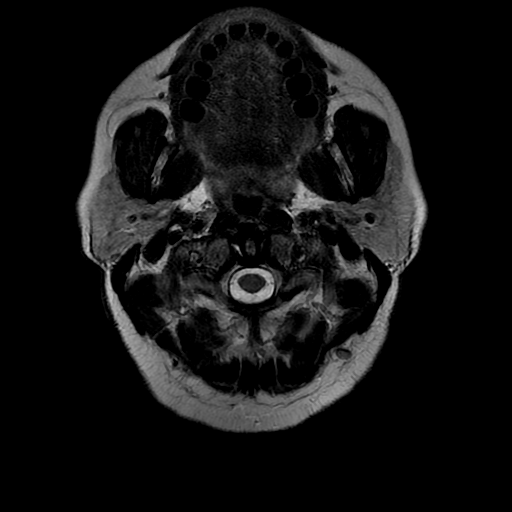
[im 12/24]
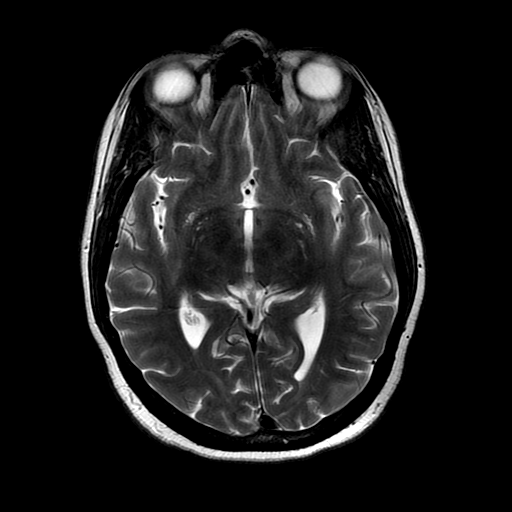
[im 24/24]
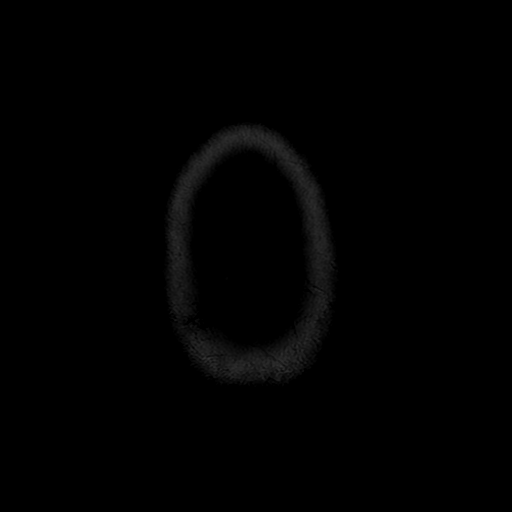

[Series 7: FLAIR · axial · 3.0mm · 0.43mm/px · z∈[-98,+37]mm · 3 of 24 slices shown]
[im 1/24]
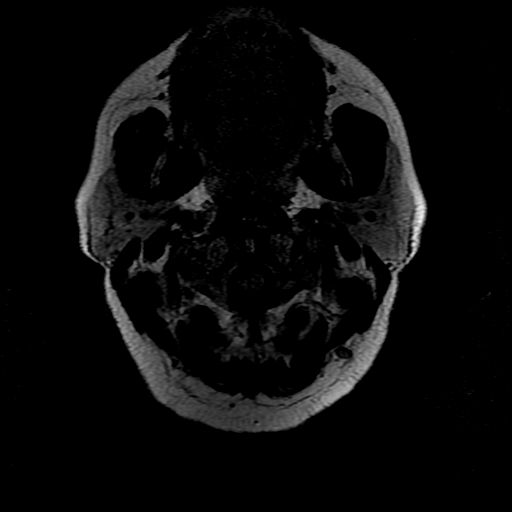
[im 12/24]
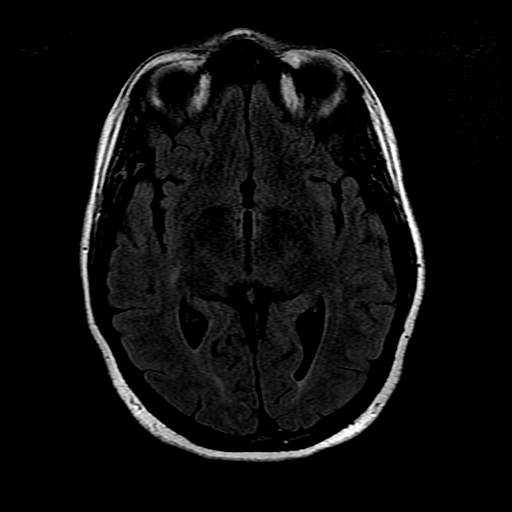
[im 24/24]
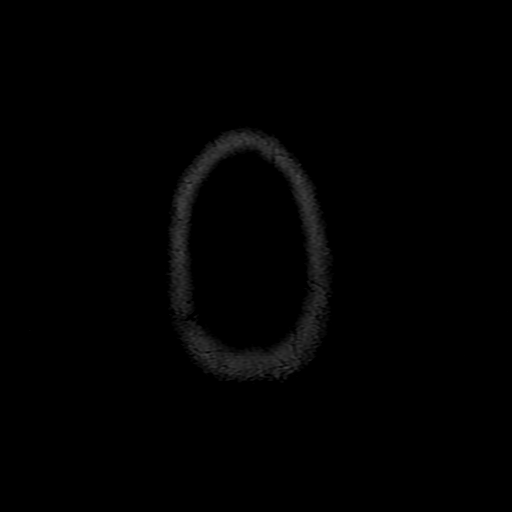

[Series 8: ax mpgr · axial · 5.0mm · 0.43mm/px · 1 of 24 slices shown]
[im 1/24]
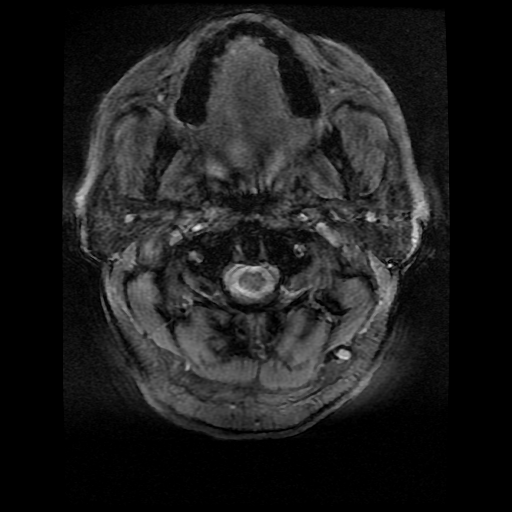

[Series 11: T2 · coronal · 5.0mm · 0.43mm/px · 3 of 30 slices shown (2 of 2)]
[im 1/30]
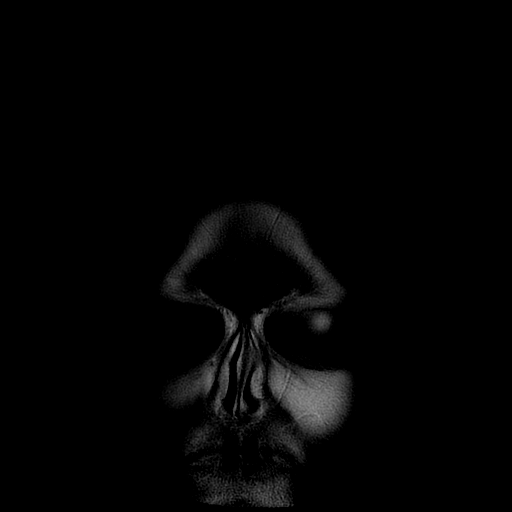
[im 15/30]
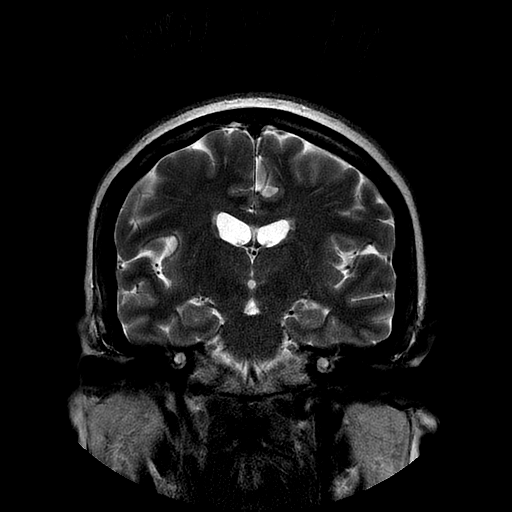
[im 30/30]
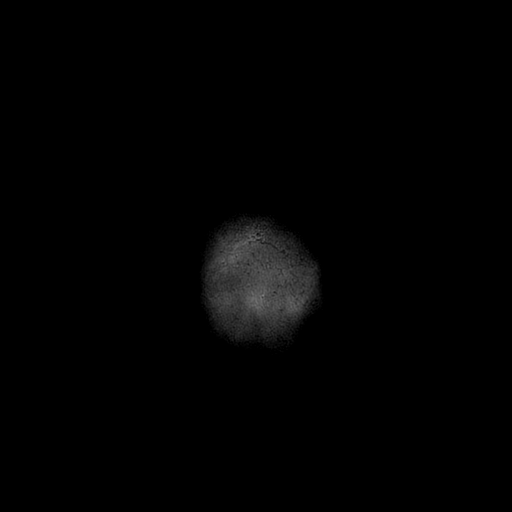

[Series 300: DWI · axial · 3.0mm · 1.09mm/px · z∈[-96,+31]mm · 5 of 44 slices shown (3 of 4)]
[im 1/44]
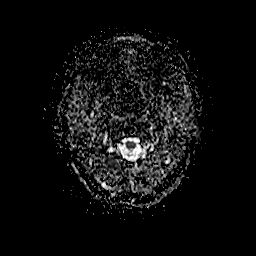
[im 11/44]
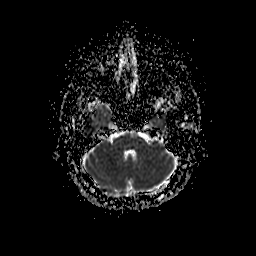
[im 22/44]
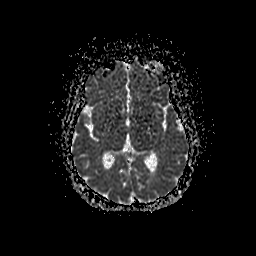
[im 33/44]
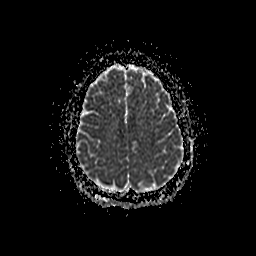
[im 44/44]
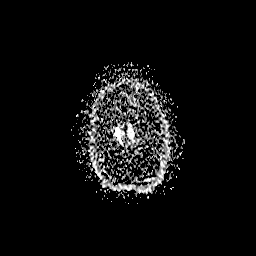

[Series 400: DWI · coronal · 5.0mm · 1.09mm/px · 4 of 34 slices shown (4 of 4)]
[im 1/34]
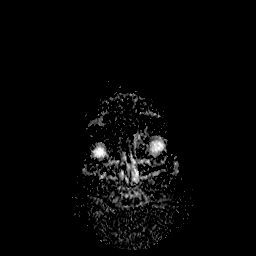
[im 12/34]
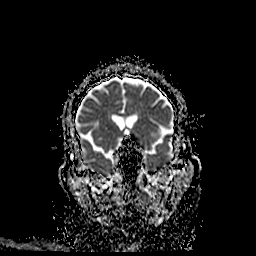
[im 23/34]
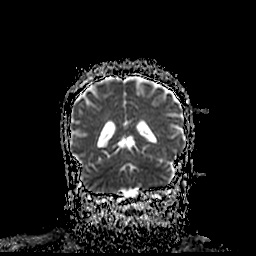
[im 34/34]
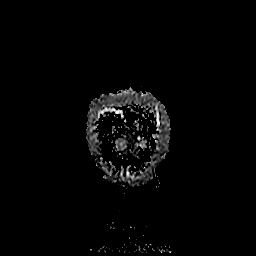

[36 of 48 positions shown; findings below may reference images not displayed]

FINDINGS: Brain: No acute infarction, hemorrhage, hydrocephalus, extra-axial
collection or mass lesion. Few FLAIR hyperintensities in the
cerebral white matter that are unchanged and nonspecific, favor
chronic small vessel ischemia given patient's multiple vascular risk
factors.

Vascular: Major flow voids are preserved

Skull and upper cervical spine: Negative for marrow lesion

Sinuses/Orbits: Bilateral cataract resection
IMPRESSION: 1. No acute finding or change from [DATE].
2. Mild, nonspecific signal abnormality in the cerebral white
matter.

## 2018-06-29 MED ORDER — SODIUM CHLORIDE 0.9 % IV SOLN
1.0000 g | Freq: Once | INTRAVENOUS | Status: AC
  Administered 2018-06-29: 03:00:00 1 g via INTRAVENOUS
  Filled 2018-06-29: qty 10

## 2018-06-29 MED ORDER — CEPHALEXIN 500 MG PO CAPS: 500.0000 mg | ORAL_CAPSULE | Freq: Four times a day (QID) | ORAL | 0 refills | Status: DC

## 2018-06-29 NOTE — ED Notes (Signed)
Patient verbalizes understanding of discharge instructions. Opportunity for questioning and answers were provided. Armband removed by staff, pt discharged from ED home via POV.  

## 2018-06-29 NOTE — Discharge Instructions (Signed)
Person Under Monitoring Name: Jill [name]  Location: [place] [address]   Infection Prevention Recommendations for Individuals Confirmed to have, or Being Evaluated for, 2019 Novel Coronavirus (COVID-19) Infection Who Receive Care at Home  Individuals who are confirmed to have, or are being evaluated for, COVID-19 should follow the prevention steps below until a healthcare provider or local or state health department says they can return to normal activities.  Stay home except to get medical care You should restrict activities outside your home, except for getting medical care. Do not go to work, school, or public areas, and do not use public transportation or taxis.  Call ahead before visiting your doctor Before your medical appointment, call the healthcare provider and tell them that you have, or are being evaluated for, COVID-19 infection. This will help the healthcare providers office take steps to keep other people from getting infected. Ask your healthcare provider to call the local or state health department.  Monitor your symptoms Seek prompt medical attention if your illness is worsening (e.g., difficulty breathing). Before going to your medical appointment, call the healthcare provider and tell them that you have, or are being evaluated for, COVID-19 infection. Ask your healthcare provider to call the local or state health department.  Wear a facemask You should wear a facemask that covers your nose and mouth when you are in the same room with other people and when you visit a healthcare provider. People who live with or visit you should also wear a facemask while they are in the same room with you.  Separate yourself from other people in your home As much as possible, you should stay in a different room from other people in your home. Also, you should use a separate bathroom, if available.  Avoid sharing household items You  should not share dishes, drinking glasses, cups, eating utensils, towels, bedding, or other items with other people in your home. After using these items, you should wash them thoroughly with soap and water.  Cover your coughs and sneezes Cover your mouth and nose with a tissue when you cough or sneeze, or you can cough or sneeze into your sleeve. Throw used tissues in a lined trash can, and immediately wash your hands with soap and water for at least 20 seconds or use an alcohol-based hand rub.  Wash your [facility] your hands often and thoroughly with soap and water for at least 20 seconds. You can use an alcohol-based hand sanitizer if soap and water are not available and if your hands are not visibly dirty. Avoid touching your eyes, nose, and mouth with unwashed hands.   Prevention Steps for Caregivers and Household Members of Individuals Confirmed to have, or Being Evaluated for, COVID-19 Infection Being Cared for in the Home  If you live with, or provide care at home for, a person confirmed to have, or being evaluated for, COVID-19 infection please follow these guidelines to prevent infection:  Follow healthcare providers instructions Make sure that you understand and can help the patient follow any healthcare provider instructions for all care.  Provide for the patients basic needs You should help the patient with basic needs in the home and provide support for getting groceries, prescriptions, and other personal needs.  Monitor the patients symptoms If they are getting sicker, call his or her medical provider and tell them that the patient has, or is being evaluated for, COVID-19 infection. This will help the healthcare providers  office take steps to keep other people from getting infected. Ask the healthcare provider to call the local or state health department.  Limit the number of people who have contact with the patient If possible, have only one caregiver for the  patient. Other household members should stay in another home or place of residence. If this is not possible, they should stay in another room, or be separated from the patient as much as possible. Use a separate bathroom, if available. Restrict visitors who do not have an essential need to be in the home.  Keep older adults, very young children, and other sick people away from the patient Keep older adults, very young children, and those who have compromised immune systems or chronic health conditions away from the patient. This includes people with chronic heart, lung, or kidney conditions, diabetes, and cancer.  Ensure good ventilation Make sure that shared spaces in the home have good air flow, such as from an air conditioner or an opened window, weather permitting.  Wash your hands often Wash your hands often and thoroughly with soap and water for at least 20 seconds. You can use an alcohol based hand sanitizer if soap and water are not available and if your hands are not visibly dirty. Avoid touching your eyes, nose, and mouth with unwashed hands. Use disposable paper towels to dry your hands. If not available, use dedicated cloth towels and replace them when they become wet.  Wear a facemask and gloves Wear a disposable facemask at all times in the room and gloves when you touch or have contact with the patients blood, body fluids, and/or secretions or excretions, such as sweat, saliva, sputum, nasal mucus, vomit, urine, or feces.  Ensure the mask fits over your nose and mouth tightly, and do not touch it during use. Throw out disposable facemasks and gloves after using them. Do not reuse. Wash your hands immediately after removing your facemask and gloves. If your personal clothing becomes contaminated, carefully remove clothing and launder. Wash your hands after handling contaminated clothing. Place all used disposable facemasks, gloves, and other waste in a lined container before  disposing them with other household waste. Remove gloves and wash your hands immediately after handling these items.  Do not share dishes, glasses, or other household items with the patient Avoid sharing household items. You should not share dishes, drinking glasses, cups, eating utensils, towels, bedding, or other items with a patient who is confirmed to have, or being evaluated for, COVID-19 infection. After the person uses these items, you should wash them thoroughly with soap and water.  Wash laundry thoroughly Immediately remove and wash clothes or bedding that have blood, body fluids, and/or secretions or excretions, such as sweat, saliva, sputum, nasal mucus, vomit, urine, or feces, on them. Wear gloves when handling laundry from the patient. Read and follow directions on labels of laundry or clothing items and detergent. In general, wash and dry with the warmest temperatures recommended on the label.  Clean all areas the individual has used often Clean all touchable surfaces, such as counters, tabletops, doorknobs, bathroom fixtures, toilets, phones, keyboards, tablets, and bedside tables, every day. Also, clean any surfaces that may have blood, body fluids, and/or secretions or excretions on them. Wear gloves when cleaning surfaces the patient has come in contact with. Use a diluted bleach solution (e.g., dilute bleach with 1 part bleach and 10 parts water) or a household disinfectant with a label that says EPA-registered for coronaviruses. To make a  bleach solution at home, add 1 tablespoon of bleach to 1 quart (4 cups) of water. For a larger supply, add  cup of bleach to 1 gallon (16 cups) of water. Read labels of cleaning products and follow recommendations provided on product labels. Labels contain instructions for safe and effective use of the cleaning product including precautions you should take when applying the product, such as wearing gloves or eye protection and making sure you  have good ventilation during use of the product. Remove gloves and wash hands immediately after cleaning.  Monitor yourself for signs and symptoms of illness Caregivers and household members are considered close contacts, should monitor their health, and will be asked to limit movement outside of the home to the extent possible. Follow the monitoring steps for close contacts listed on the symptom monitoring form.   ? If you have additional questions, contact your local health department or call the epidemiologist on call at [phone] (available 24/7). ? This guidance is subject to change. For the most up-to-date guidance from [facility], please refer to their website: YouBlogs.pl  What is Coronavirus disease 2019 (COVID-19)? Coronaviruses are a large family of viruses. Some cause illness in people, and others, such as canine and feline coronaviruses, only infect animals. Most coronaviruses in humans are known to cause what we often refer to as the common cold.  Rarely, coronaviruses can be come much more serious and have caused more serious infections such as Middle East Respiratory Syndrome (MERS) and Severe Acute Respiratory Syndrome (SARS).  COVID-19 appears to be more serious than the routine coronaviruses that cause colds, but not as severe as MERS or SARS.  What are the symptoms of Coronavirus disease 2019 (COVID-19)? Reported illnesses have ranged from mild symptoms to severe illness and death for confirmed coronavirus disease 2019 (COVID-19) cases.  The following symptoms may appear 2-14 days after exposure.  Fever  Cough  Shortness of breath  Prevention There is currently no vaccine to prevent coronavirus disease 2019 (COVID-19). The best way to prevent illness is to avoid being exposed to this virus. However, as a reminder, CDC always recommends everyday preventive actions to help prevent the spread of respiratory diseases,  including:  Avoid close contact with people who are sick.  Avoid touching your eyes, nose, and mouth.  Stay home when you are sick.  Cover your cough or sneeze with a tissue, then throw the tissue in the trash.  Clean and disinfect frequently touched objects and surfaces using a regular household cleaning spray or wipe.  Follow CDCs recommendations for using a facemask. o CDC does not recommend that people who are well wear a facemask to protect themselves from respiratory diseases, including COVID-19. o Facemasks should be used by people who show symptoms of COVID-19 to help prevent the spread of the disease to others. The use of facemasks is also crucial for health workers.  Wash your hands often with soap and water for at least 20 seconds, especially after going to the bathroom; before eating; and after blowing your nose, coughing, or sneezing. o If soap and water are not readily available, use an alcohol-based hand sanitizer with at least 60% alcohol. Always wash hands with soap and water if hands are visibly dirty.  Coronavirus ([id number]) Testing Your healthcare professional will work with your states public health department and CDC to determine if you need to be tested for COVID-19.  Only certain persons meet criteria for COVID-19 testing and testing will depend on state and CDC guidelines.  IF YOU HAD A COVID-19 TEST TODAY: Please call in 72 hours if you have not heard from [place] to follow up on your results.  You should REMAIN QUARANTINED until test results are available.  If you test positive, remain in isolation for at least 14 days (may be longer if you continue to have fevers).    If your symptoms are worsening (such as developing shortness of breath, chest pain, or confusion) please call ahead to your local emergency room to let them know you are coming so that they can prepare, or let the EMS crew know on their arrival that you have COVID-19 so that they can wear appropriate  protection.  Remain isolated from others in your home, cover any coughs/sneezes, do not share common household items (such as cups and silverware) and wash hands frequently.  IF YOU WERE TOLD YOU MAY HAVE COVID - 19 BUT DID NOT QUALIFY FOR A TEST TODAY: Remain in isolation for at least 14 days (may be longer if you continue to have fevers).  If your symptoms are worsening (such as developing shortness of breath, chest pain, or confusion) please call ahead to your local emergency room to let them know you are coming so that they can prepare, or let the EMS crew know on their arrival that you may have COVID-19 so that they can wear appropriate protection.  Remain isolated from others in your home, cover any coughs/sneezes, do not share common household items (such as cups and silverware) and wash hands frequently.

## 2018-06-29 NOTE — ED Notes (Signed)
Patient transported to MRI 

## 2018-07-04 DIAGNOSIS — F988 Other specified behavioral and emotional disorders with onset usually occurring in childhood and adolescence: Secondary | ICD-10-CM | POA: Diagnosis not present

## 2018-07-04 DIAGNOSIS — E11319 Type 2 diabetes mellitus with unspecified diabetic retinopathy without macular edema: Secondary | ICD-10-CM | POA: Diagnosis not present

## 2018-07-04 DIAGNOSIS — N390 Urinary tract infection, site not specified: Secondary | ICD-10-CM | POA: Diagnosis not present

## 2018-07-04 DIAGNOSIS — E10319 Type 1 diabetes mellitus with unspecified diabetic retinopathy without macular edema: Secondary | ICD-10-CM | POA: Diagnosis not present

## 2018-07-11 ENCOUNTER — Ambulatory Visit: Payer: Self-pay | Admitting: Neurology

## 2018-07-11 NOTE — Telephone Encounter (Signed)
Received notification from [facility] that pt no-showed her auto pap set up. I called pt to discuss. No answer, left a message asking her to call me back.

## 2018-08-30 DIAGNOSIS — Z6833 Body mass index (BMI) 33.0-33.9, adult: Secondary | ICD-10-CM | POA: Diagnosis not present

## 2018-08-30 DIAGNOSIS — Z1331 Encounter for screening for depression: Secondary | ICD-10-CM | POA: Diagnosis not present

## 2018-08-30 DIAGNOSIS — Z9181 History of falling: Secondary | ICD-10-CM | POA: Diagnosis not present

## 2018-08-30 DIAGNOSIS — Z0000 Encounter for general adult medical examination without abnormal findings: Secondary | ICD-10-CM | POA: Diagnosis not present

## 2018-09-03 ENCOUNTER — Telehealth: Payer: Self-pay | Admitting: Neurology

## 2018-09-03 ENCOUNTER — Encounter: Payer: Self-pay | Admitting: Neurology

## 2018-09-03 NOTE — Telephone Encounter (Signed)
Due to current COVID 19 pandemic, our office is severely reducing in office visits until further notice, in order to minimize the risk to our patients and healthcare providers.   Called patient and LVM x3 regarding 6/9 auto-pap appointment. Patient has not returned calls. I have sent the patient a letter advising of cancellation. I gave office hours and number and requested pt call back. I advised pt of the importance of an auto-pap follow-up in the time frame needed for insurance purposes.

## 2018-09-04 ENCOUNTER — Ambulatory Visit: Payer: Medicare Other | Admitting: Neurology

## 2018-09-14 DIAGNOSIS — L739 Follicular disorder, unspecified: Secondary | ICD-10-CM | POA: Diagnosis not present

## 2018-09-14 DIAGNOSIS — L0102 Bockhart's impetigo: Secondary | ICD-10-CM | POA: Diagnosis not present

## 2018-09-14 DIAGNOSIS — Z6834 Body mass index (BMI) 34.0-34.9, adult: Secondary | ICD-10-CM | POA: Diagnosis not present

## 2018-09-14 DIAGNOSIS — M2550 Pain in unspecified joint: Secondary | ICD-10-CM | POA: Diagnosis not present

## 2018-09-27 DIAGNOSIS — Z20828 Contact with and (suspected) exposure to other viral communicable diseases: Secondary | ICD-10-CM | POA: Diagnosis not present

## 2018-09-27 DIAGNOSIS — R05 Cough: Secondary | ICD-10-CM | POA: Diagnosis not present

## 2018-09-27 DIAGNOSIS — R509 Fever, unspecified: Secondary | ICD-10-CM | POA: Diagnosis not present

## 2018-09-27 DIAGNOSIS — R197 Diarrhea, unspecified: Secondary | ICD-10-CM | POA: Diagnosis not present

## 2018-09-27 DIAGNOSIS — R112 Nausea with vomiting, unspecified: Secondary | ICD-10-CM | POA: Diagnosis not present

## 2018-10-01 DIAGNOSIS — G2581 Restless legs syndrome: Secondary | ICD-10-CM | POA: Diagnosis not present

## 2018-10-01 DIAGNOSIS — E1065 Type 1 diabetes mellitus with hyperglycemia: Secondary | ICD-10-CM | POA: Diagnosis not present

## 2018-10-01 DIAGNOSIS — K529 Noninfective gastroenteritis and colitis, unspecified: Secondary | ICD-10-CM | POA: Diagnosis not present

## 2018-10-01 DIAGNOSIS — E10319 Type 1 diabetes mellitus with unspecified diabetic retinopathy without macular edema: Secondary | ICD-10-CM | POA: Diagnosis not present

## 2018-10-05 DIAGNOSIS — E10319 Type 1 diabetes mellitus with unspecified diabetic retinopathy without macular edema: Secondary | ICD-10-CM | POA: Diagnosis not present

## 2018-10-05 DIAGNOSIS — E1065 Type 1 diabetes mellitus with hyperglycemia: Secondary | ICD-10-CM | POA: Diagnosis not present

## 2018-10-05 DIAGNOSIS — F988 Other specified behavioral and emotional disorders with onset usually occurring in childhood and adolescence: Secondary | ICD-10-CM | POA: Diagnosis not present

## 2018-10-05 DIAGNOSIS — E11319 Type 2 diabetes mellitus with unspecified diabetic retinopathy without macular edema: Secondary | ICD-10-CM | POA: Diagnosis not present

## 2018-12-12 DIAGNOSIS — L299 Pruritus, unspecified: Secondary | ICD-10-CM | POA: Diagnosis not present

## 2018-12-12 DIAGNOSIS — J324 Chronic pansinusitis: Secondary | ICD-10-CM | POA: Diagnosis not present

## 2018-12-12 DIAGNOSIS — J0100 Acute maxillary sinusitis, unspecified: Secondary | ICD-10-CM | POA: Diagnosis not present

## 2018-12-12 DIAGNOSIS — L309 Dermatitis, unspecified: Secondary | ICD-10-CM | POA: Diagnosis not present

## 2019-01-07 DIAGNOSIS — F988 Other specified behavioral and emotional disorders with onset usually occurring in childhood and adolescence: Secondary | ICD-10-CM | POA: Diagnosis not present

## 2019-01-07 DIAGNOSIS — E10319 Type 1 diabetes mellitus with unspecified diabetic retinopathy without macular edema: Secondary | ICD-10-CM | POA: Diagnosis not present

## 2019-01-07 DIAGNOSIS — E1140 Type 2 diabetes mellitus with diabetic neuropathy, unspecified: Secondary | ICD-10-CM | POA: Diagnosis not present

## 2019-01-07 DIAGNOSIS — E1065 Type 1 diabetes mellitus with hyperglycemia: Secondary | ICD-10-CM | POA: Diagnosis not present

## 2019-01-14 DIAGNOSIS — M19041 Primary osteoarthritis, right hand: Secondary | ICD-10-CM | POA: Diagnosis not present

## 2019-01-14 DIAGNOSIS — M2550 Pain in unspecified joint: Secondary | ICD-10-CM | POA: Diagnosis not present

## 2019-01-14 DIAGNOSIS — M19042 Primary osteoarthritis, left hand: Secondary | ICD-10-CM | POA: Diagnosis not present

## 2019-01-21 ENCOUNTER — Other Ambulatory Visit: Payer: Self-pay | Admitting: Nurse Practitioner

## 2019-01-21 DIAGNOSIS — Z1231 Encounter for screening mammogram for malignant neoplasm of breast: Secondary | ICD-10-CM

## 2019-02-19 DIAGNOSIS — Z79899 Other long term (current) drug therapy: Secondary | ICD-10-CM | POA: Diagnosis not present

## 2019-02-19 DIAGNOSIS — Z794 Long term (current) use of insulin: Secondary | ICD-10-CM | POA: Diagnosis not present

## 2019-02-19 DIAGNOSIS — Z961 Presence of intraocular lens: Secondary | ICD-10-CM | POA: Diagnosis not present

## 2019-02-19 DIAGNOSIS — E113513 Type 2 diabetes mellitus with proliferative diabetic retinopathy with macular edema, bilateral: Secondary | ICD-10-CM | POA: Diagnosis not present

## 2019-02-19 DIAGNOSIS — H40053 Ocular hypertension, bilateral: Secondary | ICD-10-CM | POA: Diagnosis not present

## 2019-02-19 DIAGNOSIS — Z9842 Cataract extraction status, left eye: Secondary | ICD-10-CM | POA: Diagnosis not present

## 2019-02-19 DIAGNOSIS — H26492 Other secondary cataract, left eye: Secondary | ICD-10-CM | POA: Diagnosis not present

## 2019-02-26 DIAGNOSIS — Z20828 Contact with and (suspected) exposure to other viral communicable diseases: Secondary | ICD-10-CM | POA: Diagnosis not present

## 2019-02-26 DIAGNOSIS — R519 Headache, unspecified: Secondary | ICD-10-CM | POA: Diagnosis not present

## 2019-02-26 DIAGNOSIS — R509 Fever, unspecified: Secondary | ICD-10-CM | POA: Diagnosis not present

## 2019-02-26 DIAGNOSIS — J0100 Acute maxillary sinusitis, unspecified: Secondary | ICD-10-CM | POA: Diagnosis not present

## 2019-05-23 DIAGNOSIS — S60450A Superficial foreign body of right index finger, initial encounter: Secondary | ICD-10-CM | POA: Diagnosis not present

## 2019-07-04 DIAGNOSIS — H9202 Otalgia, left ear: Secondary | ICD-10-CM | POA: Diagnosis not present

## 2019-07-12 DIAGNOSIS — E1140 Type 2 diabetes mellitus with diabetic neuropathy, unspecified: Secondary | ICD-10-CM | POA: Diagnosis not present

## 2019-07-12 DIAGNOSIS — E669 Obesity, unspecified: Secondary | ICD-10-CM | POA: Diagnosis not present

## 2019-07-12 DIAGNOSIS — F988 Other specified behavioral and emotional disorders with onset usually occurring in childhood and adolescence: Secondary | ICD-10-CM | POA: Diagnosis not present

## 2019-07-12 DIAGNOSIS — E782 Mixed hyperlipidemia: Secondary | ICD-10-CM | POA: Diagnosis not present

## 2019-07-12 DIAGNOSIS — E1065 Type 1 diabetes mellitus with hyperglycemia: Secondary | ICD-10-CM | POA: Diagnosis not present

## 2019-07-12 DIAGNOSIS — E10319 Type 1 diabetes mellitus with unspecified diabetic retinopathy without macular edema: Secondary | ICD-10-CM | POA: Diagnosis not present

## 2019-07-12 DIAGNOSIS — Z79899 Other long term (current) drug therapy: Secondary | ICD-10-CM | POA: Diagnosis not present

## 2019-08-30 ENCOUNTER — Ambulatory Visit
Admission: RE | Admit: 2019-08-30 | Discharge: 2019-08-30 | Disposition: A | Discharge status: Home / Self Care | Payer: Medicare Other | Source: Ambulatory Visit | Attending: Nurse Practitioner | Admitting: Nurse Practitioner

## 2019-08-30 ENCOUNTER — Other Ambulatory Visit: Payer: Self-pay

## 2019-08-30 DIAGNOSIS — Z1231 Encounter for screening mammogram for malignant neoplasm of breast: Secondary | ICD-10-CM

## 2019-08-30 IMAGING — MG DIGITAL SCREENING BILAT W/ TOMO W/ CAD
8 series · 8 of 24 positions shown · non-contrast
Comparison: Previous exam(s).

CLINICAL DATA: Screening.

EXAM:
DIGITAL SCREENING BILATERAL MAMMOGRAM WITH TOMO AND CAD

[R CC synth-2D]
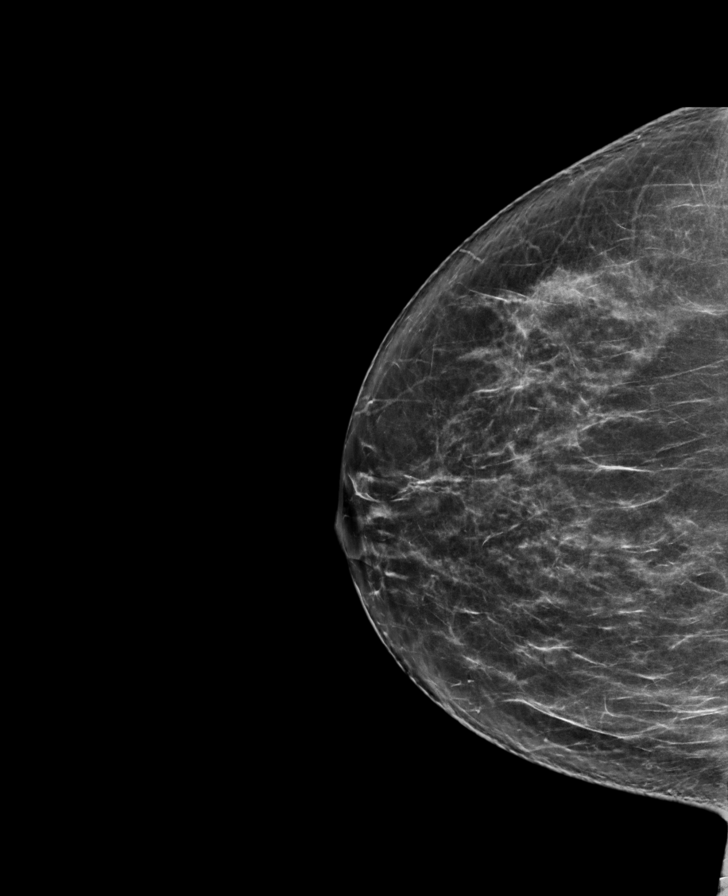

[L MLO synth-2D]
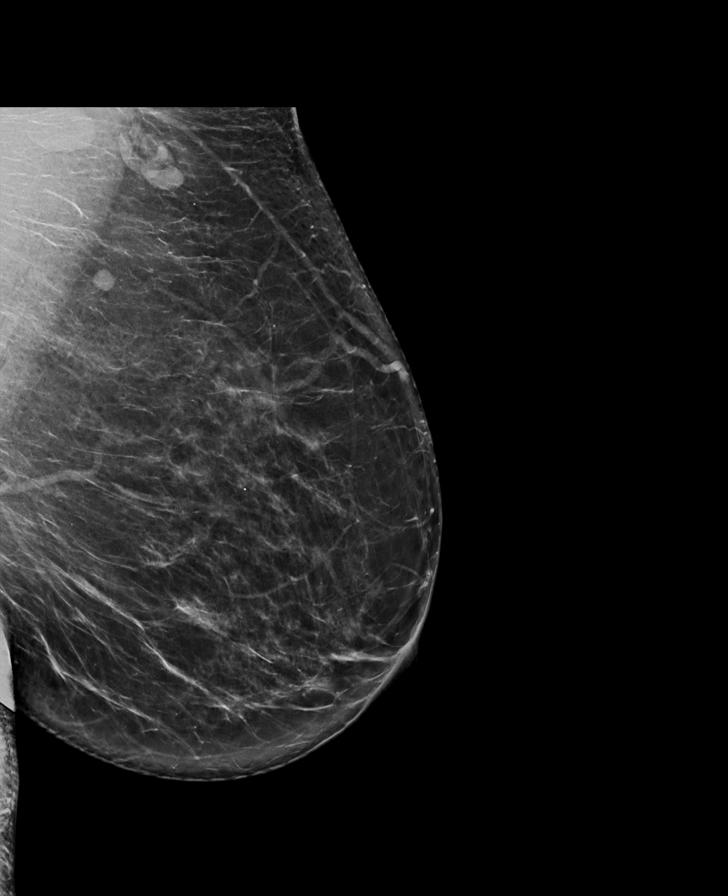

[L CC synth-2D]
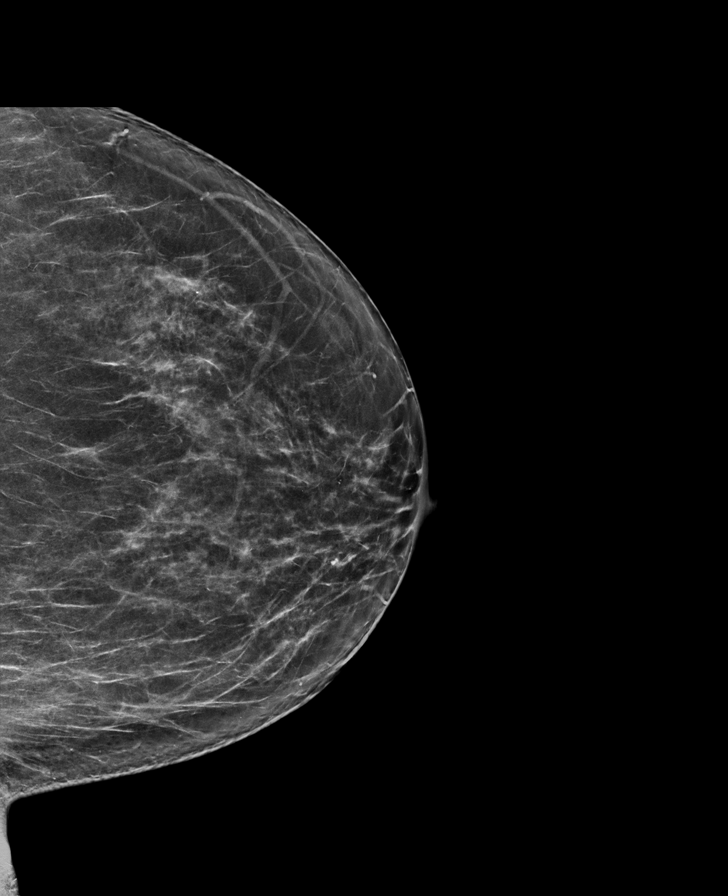

[R MLO synth-2D]
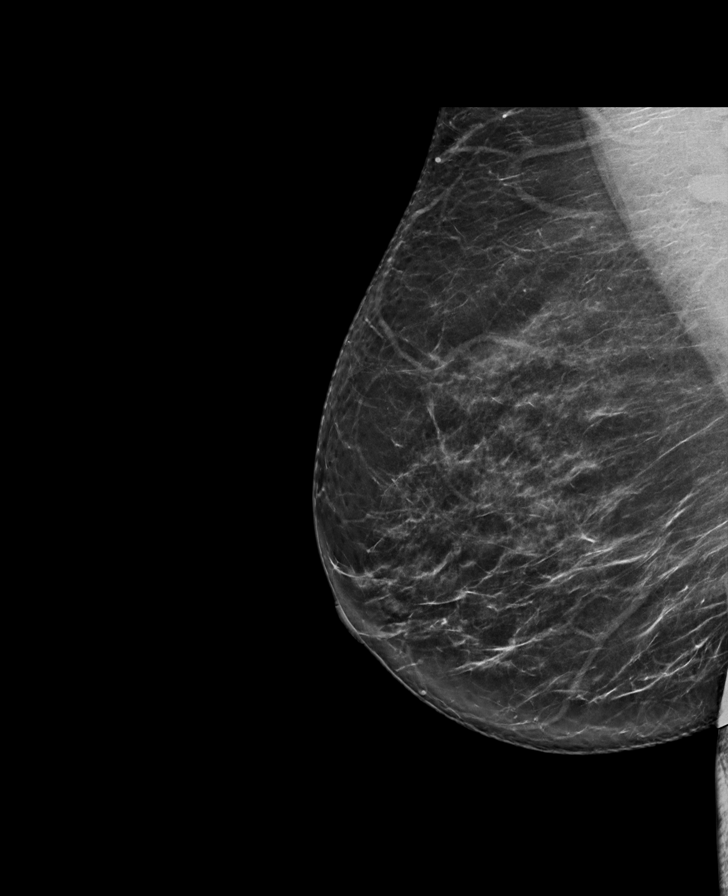

[L MLO tomo · tomo slice 41/82.0]
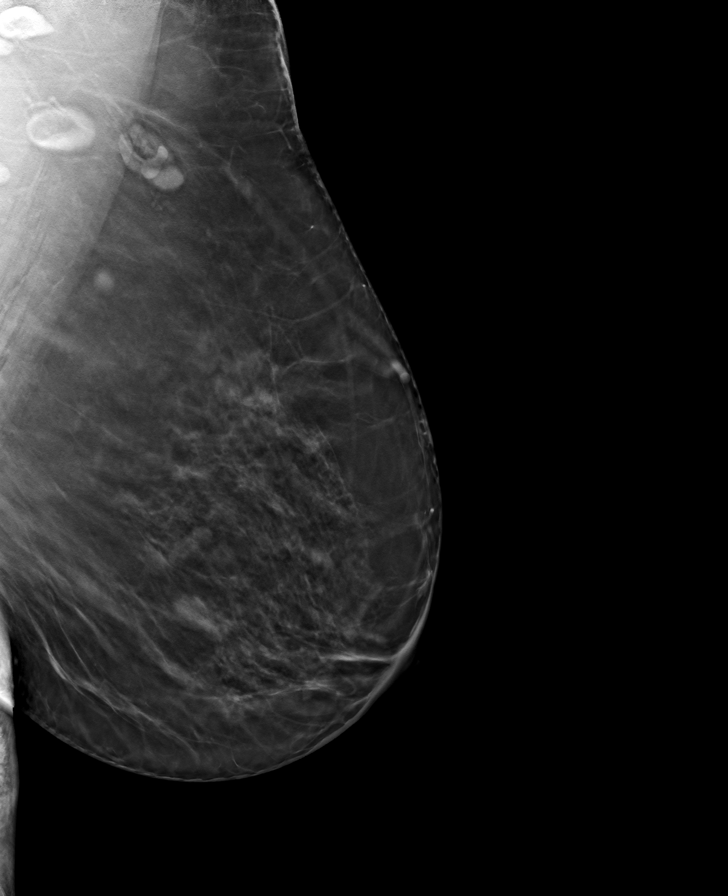

[R CC tomo · tomo slice 38/75.0]
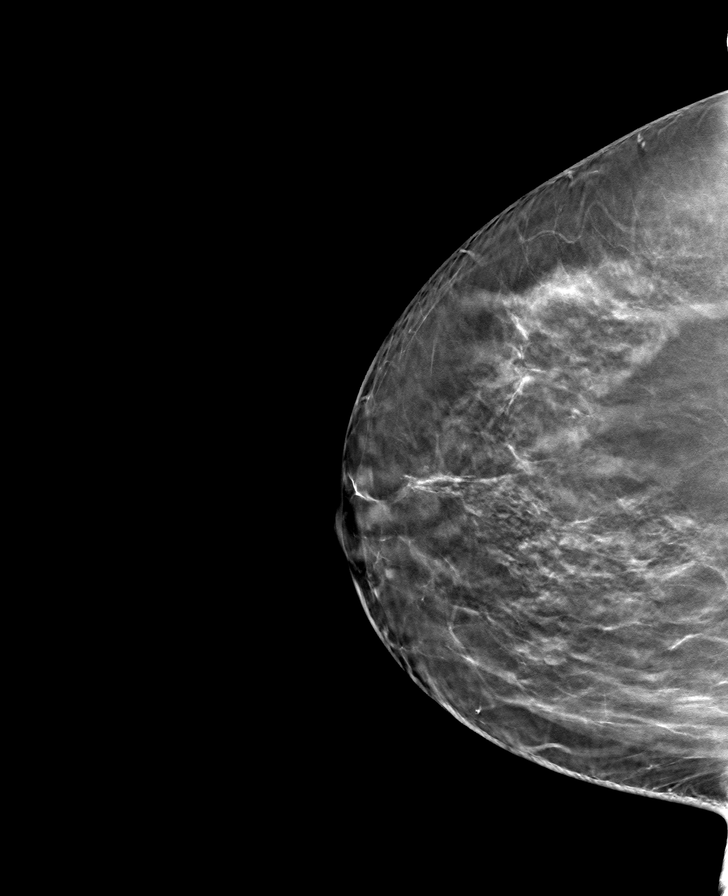

[L CC tomo · tomo slice 36/71.0]
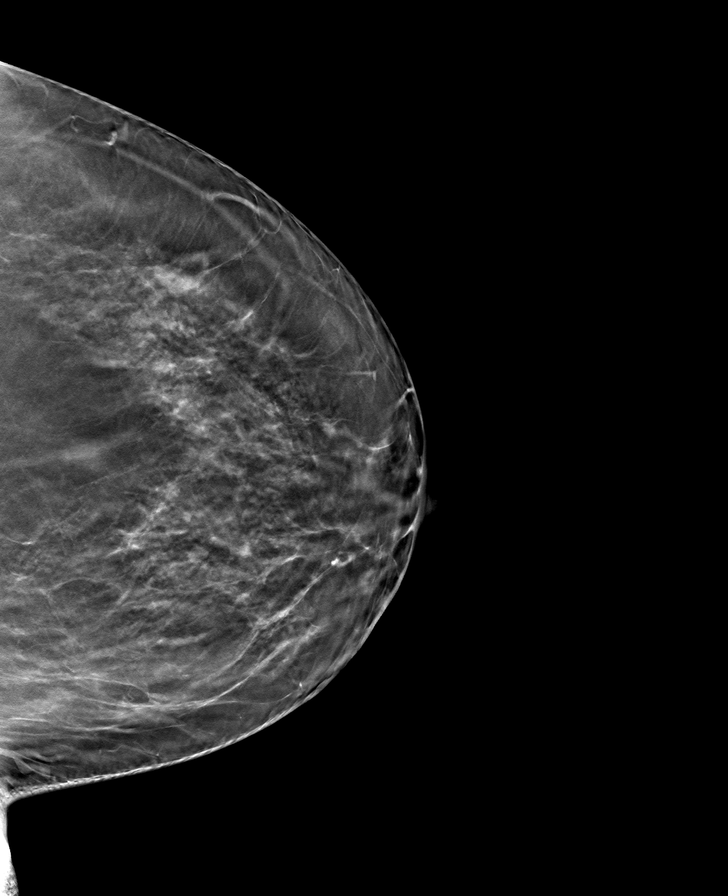

[R MLO tomo · tomo slice 41/80.0]
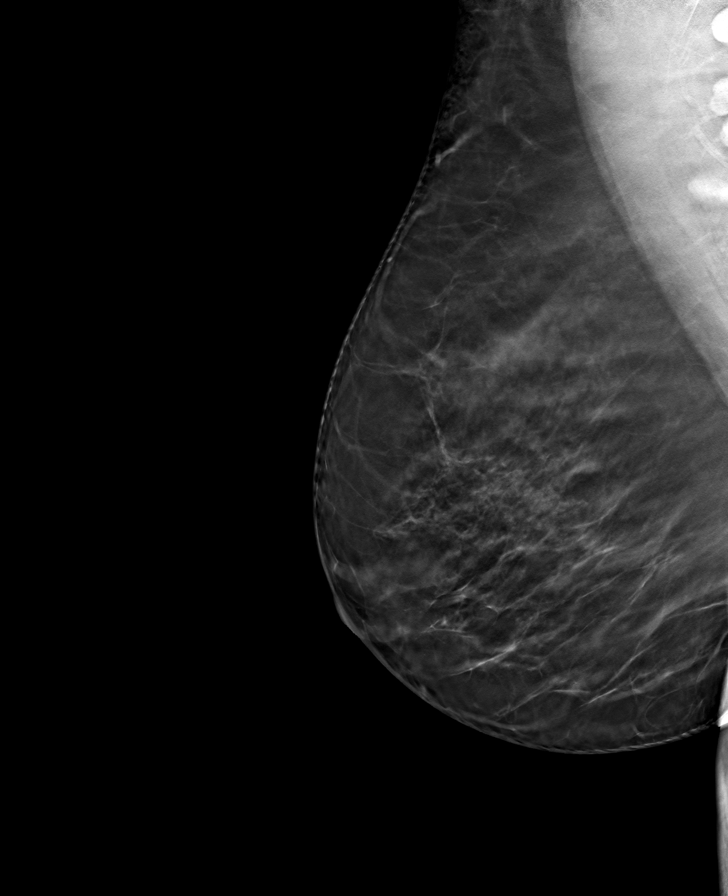

[8 of 24 positions shown; findings below may reference images not displayed]

ACR Breast Density Category b: There are scattered areas of
fibroglandular density.
FINDINGS: There are no findings suspicious for malignancy. Images were
processed with CAD.
IMPRESSION: No mammographic evidence of malignancy. A result letter of this
screening mammogram will be mailed directly to the patient.

RECOMMENDATION:
Screening mammogram in one year. (Code:[TQ])

BI-RADS CATEGORY  1: Negative.

## 2019-09-02 DIAGNOSIS — Z9181 History of falling: Secondary | ICD-10-CM | POA: Diagnosis not present

## 2019-09-02 DIAGNOSIS — Z0000 Encounter for general adult medical examination without abnormal findings: Secondary | ICD-10-CM | POA: Diagnosis not present

## 2019-09-02 DIAGNOSIS — E785 Hyperlipidemia, unspecified: Secondary | ICD-10-CM | POA: Diagnosis not present

## 2019-09-02 DIAGNOSIS — Z1331 Encounter for screening for depression: Secondary | ICD-10-CM | POA: Diagnosis not present

## 2019-09-02 DIAGNOSIS — Z6833 Body mass index (BMI) 33.0-33.9, adult: Secondary | ICD-10-CM | POA: Diagnosis not present

## 2019-10-11 DIAGNOSIS — H548 Legal blindness, as defined in USA: Secondary | ICD-10-CM | POA: Diagnosis not present

## 2019-10-11 DIAGNOSIS — E11319 Type 2 diabetes mellitus with unspecified diabetic retinopathy without macular edema: Secondary | ICD-10-CM | POA: Diagnosis not present

## 2019-10-11 DIAGNOSIS — F339 Major depressive disorder, recurrent, unspecified: Secondary | ICD-10-CM | POA: Diagnosis not present

## 2019-10-11 DIAGNOSIS — I10 Essential (primary) hypertension: Secondary | ICD-10-CM | POA: Diagnosis not present

## 2019-10-11 DIAGNOSIS — F988 Other specified behavioral and emotional disorders with onset usually occurring in childhood and adolescence: Secondary | ICD-10-CM | POA: Diagnosis not present

## 2019-10-11 DIAGNOSIS — E1140 Type 2 diabetes mellitus with diabetic neuropathy, unspecified: Secondary | ICD-10-CM | POA: Diagnosis not present

## 2019-10-11 DIAGNOSIS — D693 Immune thrombocytopenic purpura: Secondary | ICD-10-CM | POA: Diagnosis not present

## 2019-10-11 DIAGNOSIS — G2581 Restless legs syndrome: Secondary | ICD-10-CM | POA: Diagnosis not present

## 2019-10-11 DIAGNOSIS — E782 Mixed hyperlipidemia: Secondary | ICD-10-CM | POA: Diagnosis not present

## 2019-10-11 DIAGNOSIS — E1065 Type 1 diabetes mellitus with hyperglycemia: Secondary | ICD-10-CM | POA: Diagnosis not present

## 2019-10-11 DIAGNOSIS — N1832 Chronic kidney disease, stage 3b: Secondary | ICD-10-CM | POA: Diagnosis not present

## 2019-10-11 DIAGNOSIS — E10319 Type 1 diabetes mellitus with unspecified diabetic retinopathy without macular edema: Secondary | ICD-10-CM | POA: Diagnosis not present

## 2019-12-12 DIAGNOSIS — J209 Acute bronchitis, unspecified: Secondary | ICD-10-CM | POA: Diagnosis not present

## 2019-12-12 DIAGNOSIS — Z20828 Contact with and (suspected) exposure to other viral communicable diseases: Secondary | ICD-10-CM | POA: Diagnosis not present

## 2020-01-23 DIAGNOSIS — Z23 Encounter for immunization: Secondary | ICD-10-CM | POA: Diagnosis not present

## 2020-02-05 DIAGNOSIS — E11319 Type 2 diabetes mellitus with unspecified diabetic retinopathy without macular edema: Secondary | ICD-10-CM | POA: Diagnosis not present

## 2020-02-05 DIAGNOSIS — E1140 Type 2 diabetes mellitus with diabetic neuropathy, unspecified: Secondary | ICD-10-CM | POA: Diagnosis not present

## 2020-02-05 DIAGNOSIS — G2581 Restless legs syndrome: Secondary | ICD-10-CM | POA: Diagnosis not present

## 2020-02-05 DIAGNOSIS — E1065 Type 1 diabetes mellitus with hyperglycemia: Secondary | ICD-10-CM | POA: Diagnosis not present

## 2020-02-05 DIAGNOSIS — E782 Mixed hyperlipidemia: Secondary | ICD-10-CM | POA: Diagnosis not present

## 2020-02-05 DIAGNOSIS — N1832 Chronic kidney disease, stage 3b: Secondary | ICD-10-CM | POA: Diagnosis not present

## 2020-02-05 DIAGNOSIS — I10 Essential (primary) hypertension: Secondary | ICD-10-CM | POA: Diagnosis not present

## 2020-02-05 DIAGNOSIS — H548 Legal blindness, as defined in USA: Secondary | ICD-10-CM | POA: Diagnosis not present

## 2020-02-05 DIAGNOSIS — F988 Other specified behavioral and emotional disorders with onset usually occurring in childhood and adolescence: Secondary | ICD-10-CM | POA: Diagnosis not present

## 2020-02-05 DIAGNOSIS — D693 Immune thrombocytopenic purpura: Secondary | ICD-10-CM | POA: Diagnosis not present

## 2020-02-05 DIAGNOSIS — Z79899 Other long term (current) drug therapy: Secondary | ICD-10-CM | POA: Diagnosis not present

## 2020-02-05 DIAGNOSIS — E10319 Type 1 diabetes mellitus with unspecified diabetic retinopathy without macular edema: Secondary | ICD-10-CM | POA: Diagnosis not present

## 2020-02-10 DIAGNOSIS — Z23 Encounter for immunization: Secondary | ICD-10-CM | POA: Diagnosis not present

## 2020-03-06 DIAGNOSIS — B373 Candidiasis of vulva and vagina: Secondary | ICD-10-CM | POA: Diagnosis not present

## 2020-03-06 DIAGNOSIS — Z6832 Body mass index (BMI) 32.0-32.9, adult: Secondary | ICD-10-CM | POA: Diagnosis not present

## 2020-03-06 DIAGNOSIS — N390 Urinary tract infection, site not specified: Secondary | ICD-10-CM | POA: Diagnosis not present

## 2020-03-19 DIAGNOSIS — N390 Urinary tract infection, site not specified: Secondary | ICD-10-CM | POA: Diagnosis not present

## 2020-06-29 ENCOUNTER — Emergency Department (HOSPITAL_COMMUNITY): Payer: Medicare Other

## 2020-06-29 ENCOUNTER — Other Ambulatory Visit: Payer: Self-pay

## 2020-06-29 ENCOUNTER — Emergency Department (HOSPITAL_COMMUNITY)
Admission: EM | Admit: 2020-06-29 | Discharge: 2020-06-29 | Disposition: A | Discharge status: Home / Self Care | Payer: Medicare Other | Attending: Emergency Medicine | Admitting: Emergency Medicine

## 2020-06-29 DIAGNOSIS — I10 Essential (primary) hypertension: Secondary | ICD-10-CM | POA: Insufficient documentation

## 2020-06-29 DIAGNOSIS — Z7982 Long term (current) use of aspirin: Secondary | ICD-10-CM | POA: Insufficient documentation

## 2020-06-29 DIAGNOSIS — R29810 Facial weakness: Secondary | ICD-10-CM | POA: Insufficient documentation

## 2020-06-29 DIAGNOSIS — M542 Cervicalgia: Secondary | ICD-10-CM | POA: Diagnosis not present

## 2020-06-29 DIAGNOSIS — R531 Weakness: Secondary | ICD-10-CM | POA: Diagnosis not present

## 2020-06-29 DIAGNOSIS — Z79899 Other long term (current) drug therapy: Secondary | ICD-10-CM | POA: Diagnosis not present

## 2020-06-29 DIAGNOSIS — Z91010 Allergy to peanuts: Secondary | ICD-10-CM | POA: Insufficient documentation

## 2020-06-29 DIAGNOSIS — Z7984 Long term (current) use of oral hypoglycemic drugs: Secondary | ICD-10-CM | POA: Insufficient documentation

## 2020-06-29 DIAGNOSIS — E119 Type 2 diabetes mellitus without complications: Secondary | ICD-10-CM | POA: Diagnosis not present

## 2020-06-29 DIAGNOSIS — R29818 Other symptoms and signs involving the nervous system: Secondary | ICD-10-CM | POA: Diagnosis not present

## 2020-06-29 DIAGNOSIS — R200 Anesthesia of skin: Secondary | ICD-10-CM | POA: Diagnosis not present

## 2020-06-29 DIAGNOSIS — Z794 Long term (current) use of insulin: Secondary | ICD-10-CM | POA: Diagnosis not present

## 2020-06-29 LAB — COMPREHENSIVE METABOLIC PANEL
ALT: 59 U/L — ABNORMAL HIGH (ref 0–44)
AST: 33 U/L (ref 15–41)
Albumin: 3.2 g/dL — ABNORMAL LOW (ref 3.5–5.0)
Alkaline Phosphatase: 252 U/L — ABNORMAL HIGH (ref 38–126)
Anion gap: 10 (ref 5–15)
BUN: 19 mg/dL (ref 6–20)
CO2: 26 mmol/L (ref 22–32)
Calcium: 9.7 mg/dL (ref 8.9–10.3)
Chloride: 99 mmol/L (ref 98–111)
Creatinine, Ser: 1.41 mg/dL — ABNORMAL HIGH (ref 0.44–1.00)
GFR, Estimated: 45 mL/min — ABNORMAL LOW (ref >60)
Glucose, Bld: 341 mg/dL — ABNORMAL HIGH (ref 70–99)
Potassium: 5.3 mmol/L — ABNORMAL HIGH (ref 3.5–5.1)
Sodium: 135 mmol/L (ref 135–145)
Total Bilirubin: 0.5 mg/dL (ref 0.3–1.2)
Total Protein: 7 g/dL (ref 6.5–8.1)

## 2020-06-29 LAB — APTT: aPTT: 25 seconds (ref 24–36)

## 2020-06-29 LAB — I-STAT CHEM 8, ED
BUN: 22 mg/dL — ABNORMAL HIGH (ref 6–20)
Calcium, Ion: 1.21 mmol/L (ref 1.15–1.40)
Chloride: 101 mmol/L (ref 98–111)
Creatinine, Ser: 1.3 mg/dL — ABNORMAL HIGH (ref 0.44–1.00)
Glucose, Bld: 340 mg/dL — ABNORMAL HIGH (ref 70–99)
HCT: 35 % — ABNORMAL LOW (ref 36.0–46.0)
Hemoglobin: 11.9 g/dL — ABNORMAL LOW (ref 12.0–15.0)
Potassium: 5.3 mmol/L — ABNORMAL HIGH (ref 3.5–5.1)
Sodium: 136 mmol/L (ref 135–145)
TCO2: 27 mmol/L (ref 22–32)

## 2020-06-29 LAB — CBC
HCT: 36.2 % (ref 36.0–46.0)
Hemoglobin: 11.3 g/dL — ABNORMAL LOW (ref 12.0–15.0)
MCH: 28.4 pg (ref 26.0–34.0)
MCHC: 31.2 g/dL (ref 30.0–36.0)
MCV: 91 fL (ref 80.0–100.0)
Platelets: 540 10*3/uL — ABNORMAL HIGH (ref 150–400)
RBC: 3.98 MIL/uL (ref 3.87–5.11)
RDW: 13.8 % (ref 11.5–15.5)
WBC: 10.9 10*3/uL — ABNORMAL HIGH (ref 4.0–10.5)
nRBC: 0 % (ref 0.0–0.2)

## 2020-06-29 LAB — DIFFERENTIAL
Abs Immature Granulocytes: 0.06 10*3/uL (ref 0.00–0.07)
Basophils Absolute: 0 10*3/uL (ref 0.0–0.1)
Basophils Relative: 0 %
Eosinophils Absolute: 0.8 10*3/uL — ABNORMAL HIGH (ref 0.0–0.5)
Eosinophils Relative: 7 %
Immature Granulocytes: 1 %
Lymphocytes Relative: 22 %
Lymphs Abs: 2.3 10*3/uL (ref 0.7–4.0)
Monocytes Absolute: 0.7 10*3/uL (ref 0.1–1.0)
Monocytes Relative: 6 %
Neutro Abs: 7 10*3/uL (ref 1.7–7.7)
Neutrophils Relative %: 64 %

## 2020-06-29 LAB — PROTIME-INR
INR: 0.9 (ref 0.8–1.2)
Prothrombin Time: 11.8 seconds (ref 11.4–15.2)

## 2020-06-29 LAB — I-STAT BETA HCG BLOOD, ED (MC, WL, AP ONLY): I-stat hCG, quantitative: 13.7 m[IU]/mL — ABNORMAL HIGH (ref <5)

## 2020-06-29 IMAGING — MR MR CERVICAL SPINE W/O CM
4 of 5 series · 18 of 48 positions shown · non-contrast
Comparison: Prior CT from [DATE]

CLINICAL DATA: Initial evaluation for severe neck pain for 1 week.

EXAM:
MRI CERVICAL SPINE WITHOUT CONTRAST
TECHNIQUE: Multiplanar, multisequence MR imaging of the cervical spine was
performed. No intravenous contrast was administered.

[Series 2: T2 · sagittal · 3.0mm · 0.43mm/px · 4 of 15 slices shown (1 of 2)]
[im 1/15]
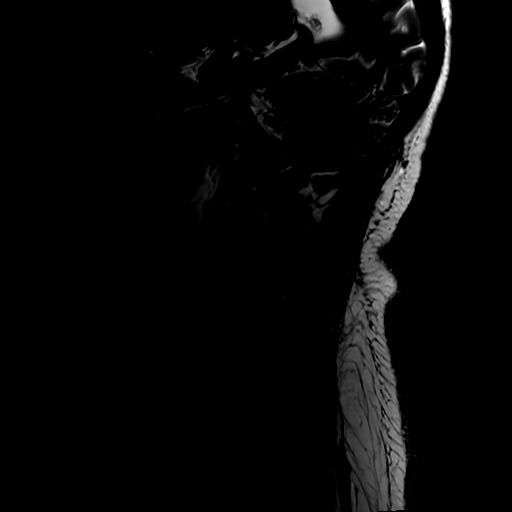
[im 5/15]
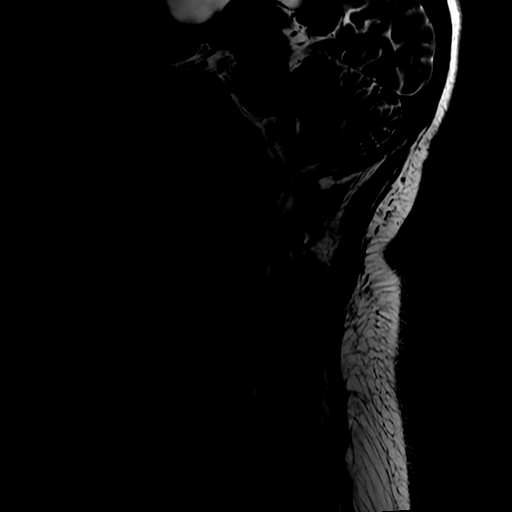
[im 10/15]
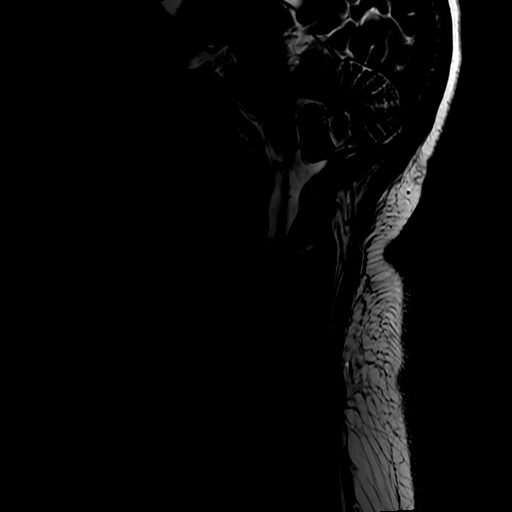
[im 15/15]
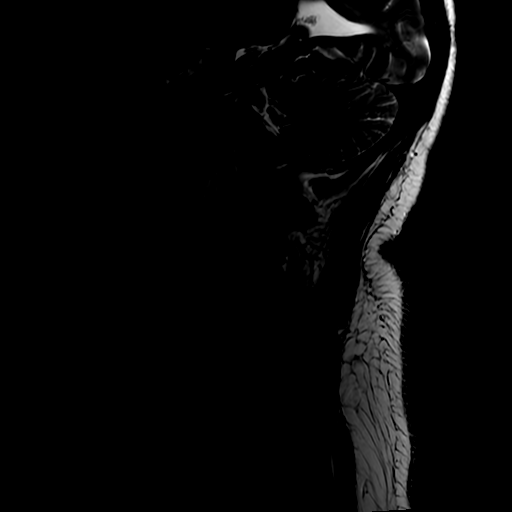

[Series 3: FLAIR · sagittal · 3.0mm · 0.43mm/px · 3 of 15 slices shown]
[im 1/15]
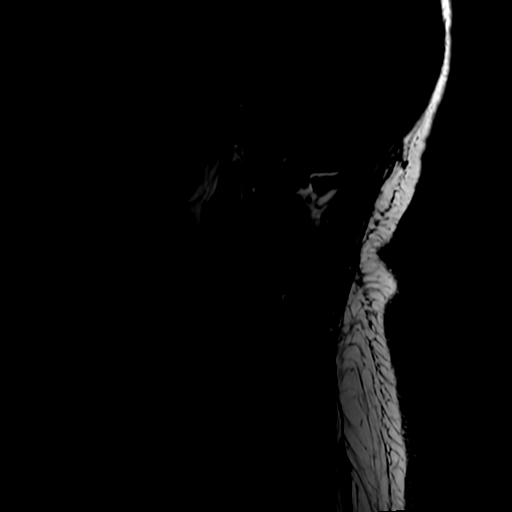
[im 8/15]
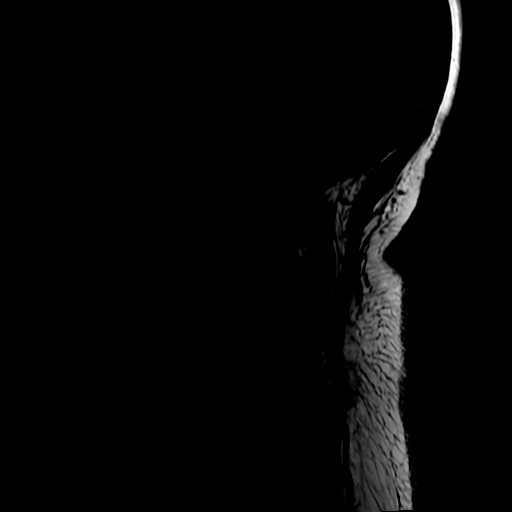
[im 15/15]
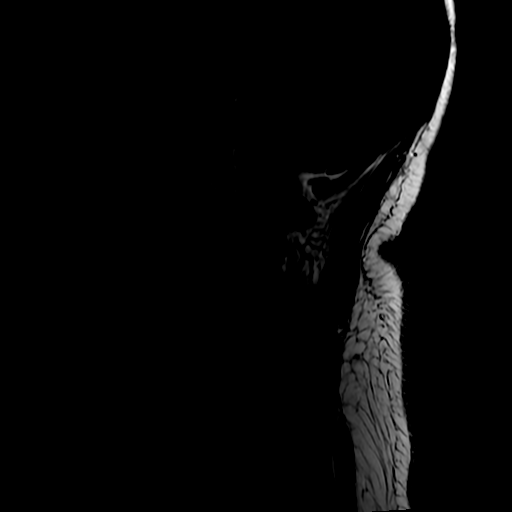

[Series 4: STIR · sagittal · 3.0mm · 0.43mm/px · 3 of 15 slices shown]
[im 1/15]
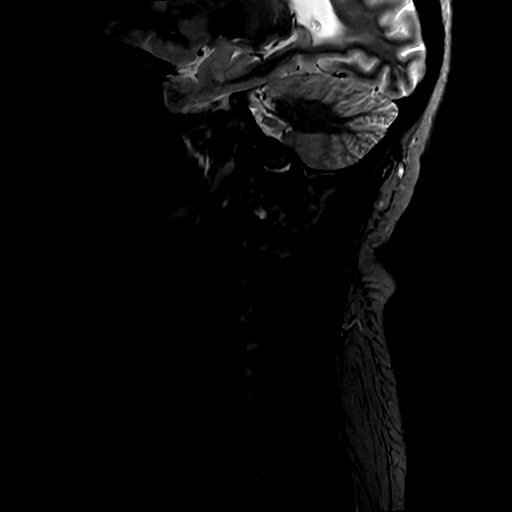
[im 8/15]
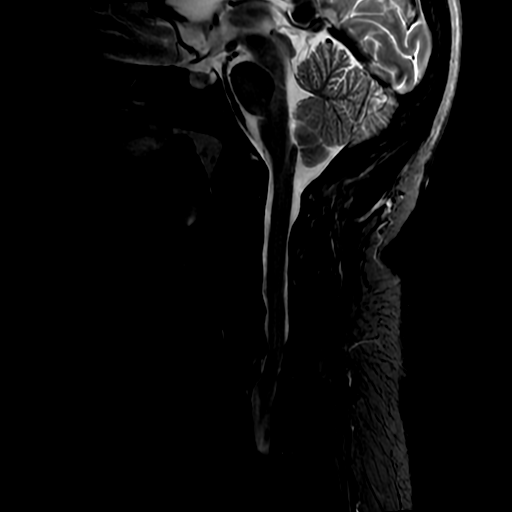
[im 15/15]
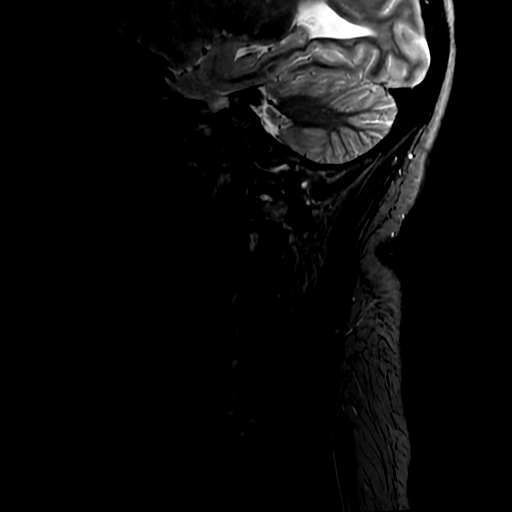

[Series 6: T2 · axial · 3.0mm · 0.35mm/px · z∈[-102,-29]mm · 8 of 27 slices shown (2 of 2)]
[im 1/27]
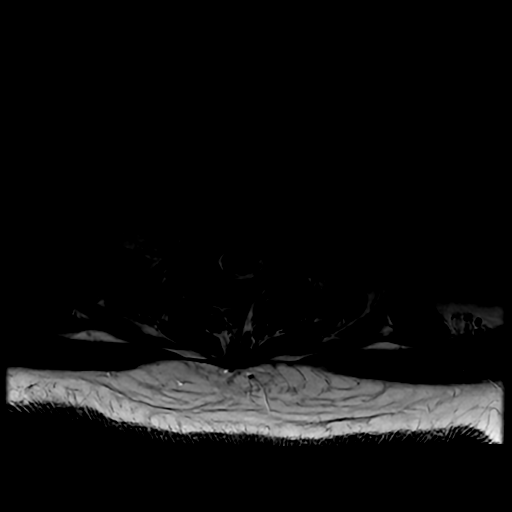
[im 4/27]
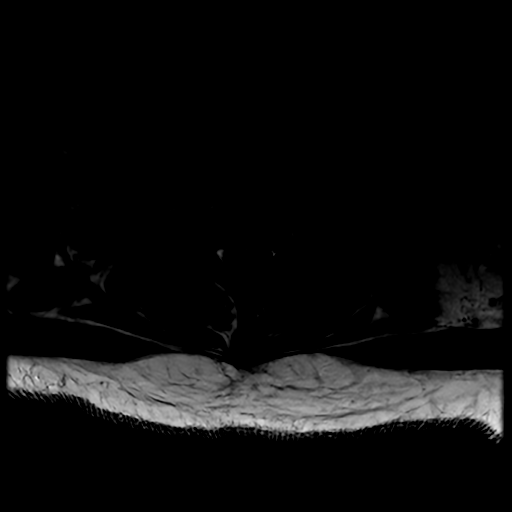
[im 7/27]
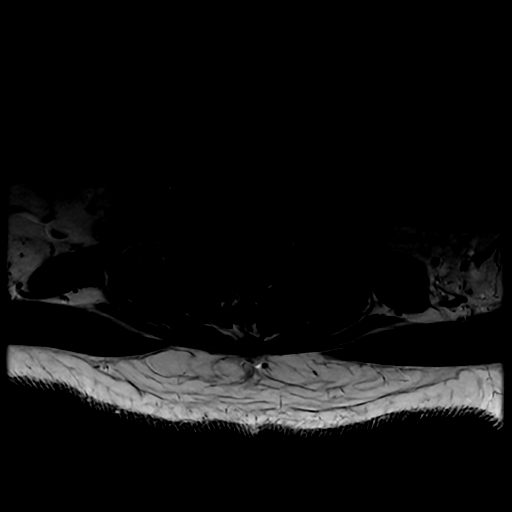
[im 10/27]
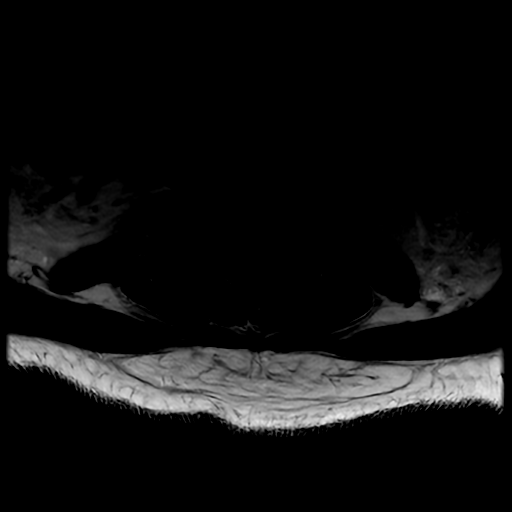
[im 14/27]
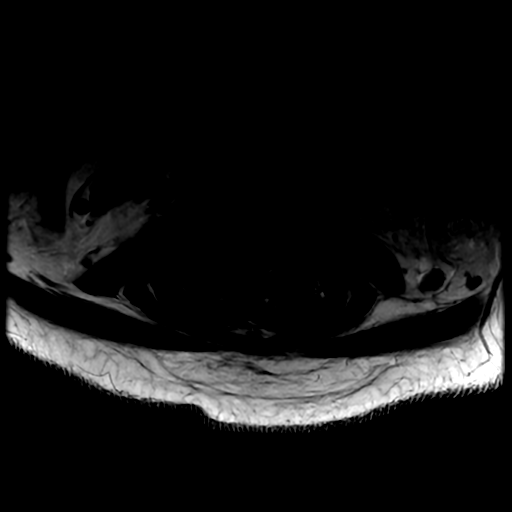
[im 17/27]
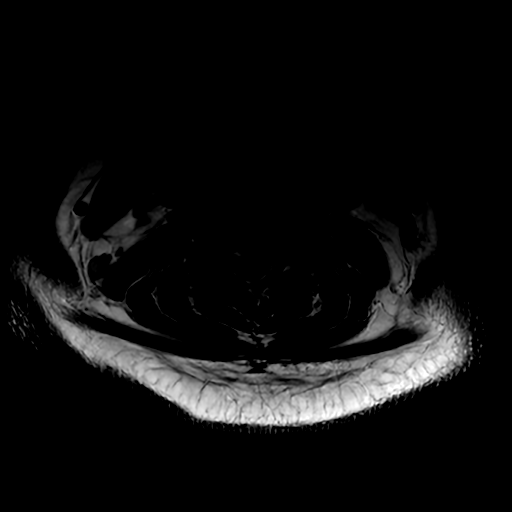
[im 20/27]
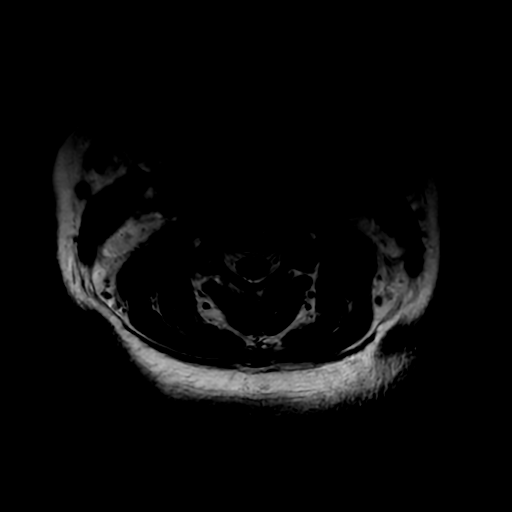
[im 23/27]
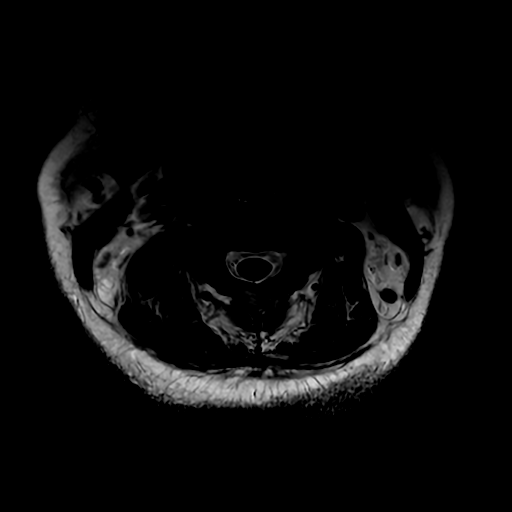

[18 of 48 positions shown; findings below may reference images not displayed]

FINDINGS: Alignment: Straightening with mild reversal of the normal cervical
lordosis. Trace 2 mm retrolisthesis of C6 on C7, chronic and
degenerative.

Vertebrae: Vertebral body height maintained without evidence for
acute or chronic fracture. Bone marrow signal intensity within
normal limits. No discrete or worrisome osseous lesions. Mild
discogenic reactive endplate change present about the C5-6 and C6-7
interspaces. No abnormal marrow edema.

Cord: Normal signal and morphology.

Posterior Fossa, vertebral arteries, paraspinal tissues: 5 mm benign
appearing cystic lesion at the posterior aspect of the pituitary
gland noted, likely a small pars intermedius cyst. Visualized brain
and posterior fossa otherwise unremarkable. Craniocervical junction
within normal limits. Paraspinous and prevertebral soft tissues
normal. Normal flow voids seen within the vertebral arteries
bilaterally.

Disc levels:

C2-C3: Normal interspace. Mild facet hypertrophy. No canal or
foraminal stenosis.

C3-C4: Minimal disc bulge with uncovertebral hypertrophy. No spinal
stenosis. Foramina remain patent.

C4-C5: Diffuse disc bulge with bilateral uncovertebral hypertrophy.
Broad posterior disc osteophyte flattens and partially effaces the
ventral thecal sac, asymmetric to the right. Mild spinal stenosis
without frank cord impingement. Mild right worse than left C5
foraminal stenosis.

C5-C6: Advanced degenerative intervertebral disc space narrowing
with diffuse disc osteophyte complex. Broad posterior component
flattens and partially effaces the ventral thecal sac. Resultant
moderate spinal stenosis with mild cord flattening, but no cord
signal changes. Moderate bilateral C6 foraminal narrowing.

C6-C7: Advanced degenerative intervertebral disc space narrowing
with diffuse disc osteophyte complex. Broad posterior component
effaces the ventral thecal sac with resultant moderate spinal
stenosis. Mild cord flattening without cord signal changes. Severe
right worse than left C7 foraminal stenosis.

C7-T1: Mild disc bulge with right-sided uncovertebral hypertrophy.
Mild facet spurring. No spinal stenosis. Mild to moderate right C8
foraminal narrowing. Left neural foramina remains patent.

Visualized upper thoracic spine demonstrates mild noncompressive
disc bulging at T2-3 without significant stenosis.
IMPRESSION: 1. No acute abnormality within the cervical spine.
2. Degenerative disc osteophyte at C5-6 and C6-7 with resultant
moderate spinal stenosis, with moderate to severe bilateral C6 and
C7 foraminal narrowing as above.
3. Right eccentric disc osteophyte at C4-5 with resultant mild canal
and right worse than left C5 foraminal stenosis.
4. Right-sided uncovertebral hypertrophy at C7-T1 with resultant
mild to moderate right C8 foraminal narrowing.

## 2020-06-29 IMAGING — MR MR HEAD W/O CM
9 of 10 series · 35 of 48 positions shown · non-contrast
Comparison: Prior head CT from earlier the same day.

CLINICAL DATA: Initial evaluation for acute left-sided numbness and
weakness.

EXAM:
MRI HEAD WITHOUT CONTRAST
TECHNIQUE: Multiplanar, multiecho pulse sequences of the brain and surrounding
structures were obtained without intravenous contrast.

[Series 2: DWI · axial · 3.0mm · 0.94mm/px · z∈[-81,+60]mm · 8 of 100 slices shown (1 of 2)]
[im 1/100]
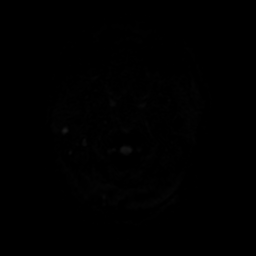
[im 12/100]
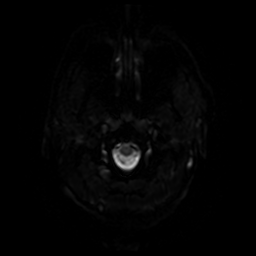
[im 34/100]
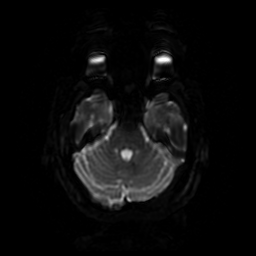
[im 45/100]
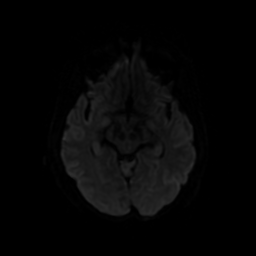
[im 56/100]
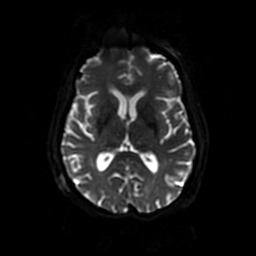
[im 67/100]
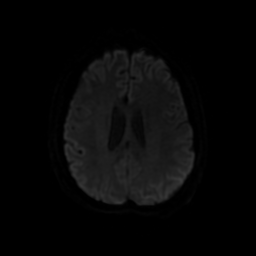
[im 89/100]
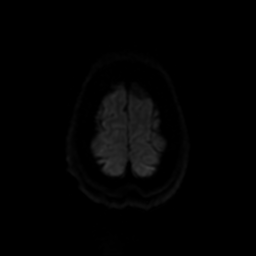
[im 100/100]
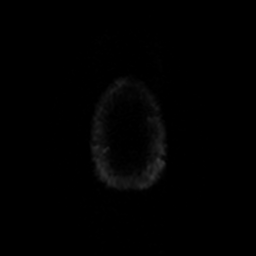

[Series 3: DWI · coronal · 4.0mm · 0.94mm/px · 7 of 70 slices shown (2 of 2)]
[im 1/70]
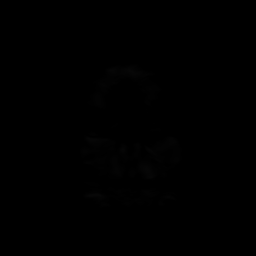
[im 12/70]
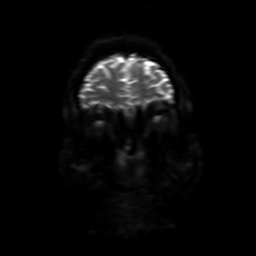
[im 24/70]
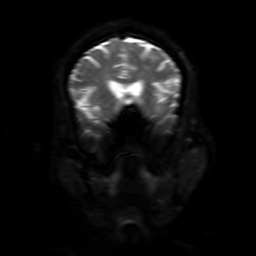
[im 35/70]
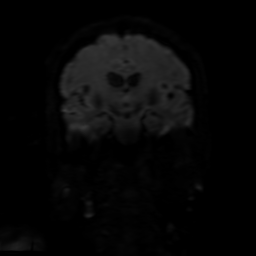
[im 47/70]
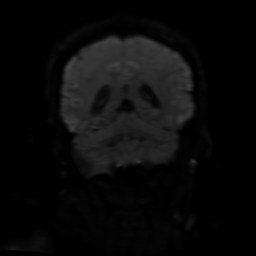
[im 58/70]
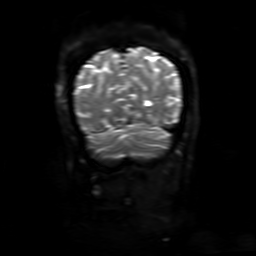
[im 70/70]
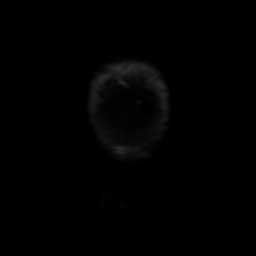

[Series 4: FLAIR · sagittal · 5.0mm · 0.47mm/px · 2 of 25 slices shown (1 of 2)]
[im 1/25]
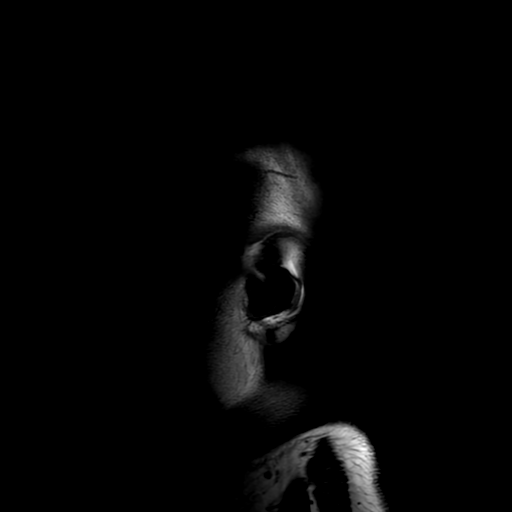
[im 25/25]
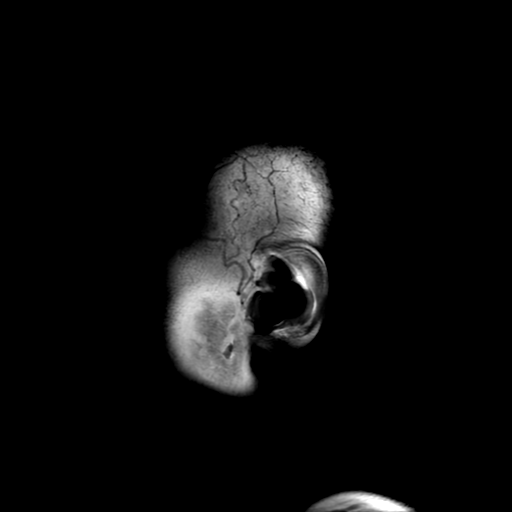

[Series 5: T2 · axial · 5.0mm · 0.47mm/px · z∈[-79,+58]mm · 2 of 25 slices shown (1 of 2)]
[im 1/25]
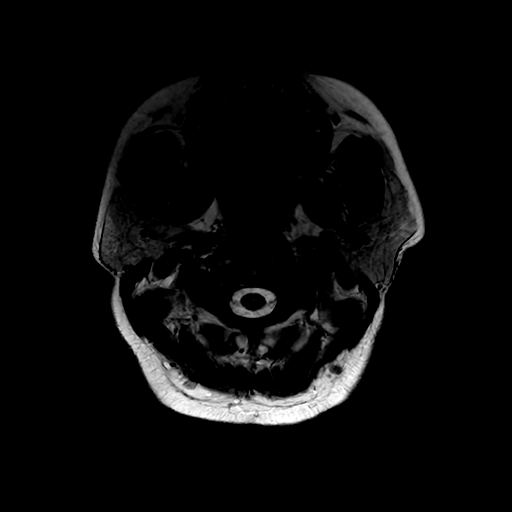
[im 25/25]
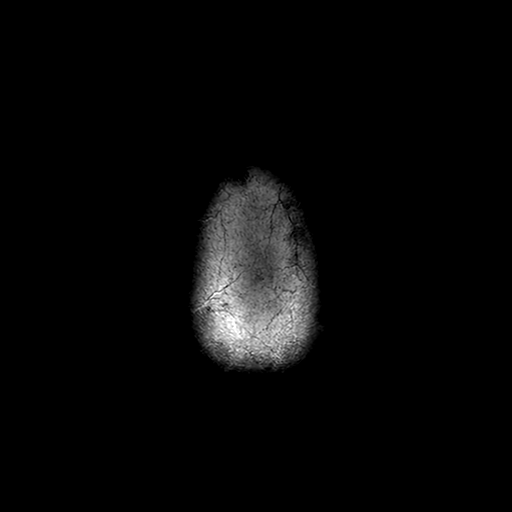

[Series 6: FLAIR · axial · 3.0mm · 0.45mm/px · z∈[-78,+60]mm · 2 of 25 slices shown (2 of 2)]
[im 1/25]
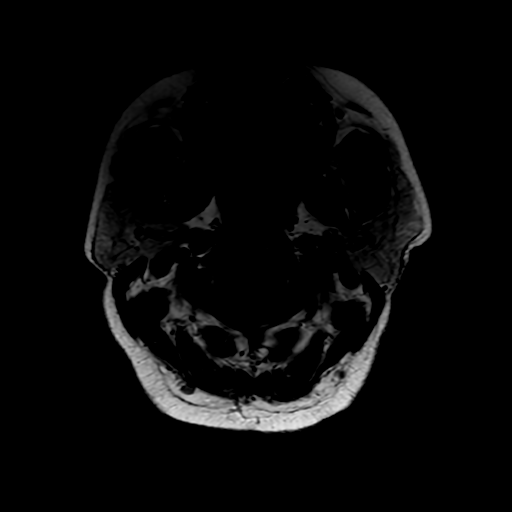
[im 25/25]
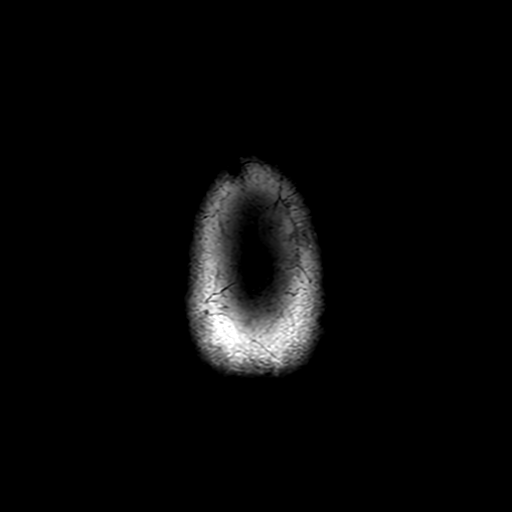

[Series 7: (person_name) · axial · 3.0mm · 0.47mm/px · z∈[-81,-47]mm · 3 of 100 slices shown]
[im 1/100]
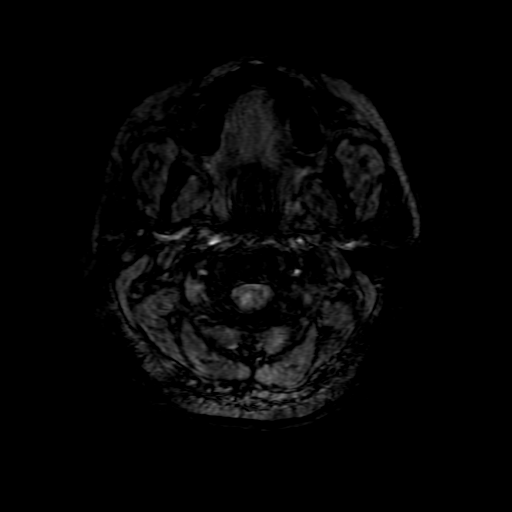
[im 13/100]
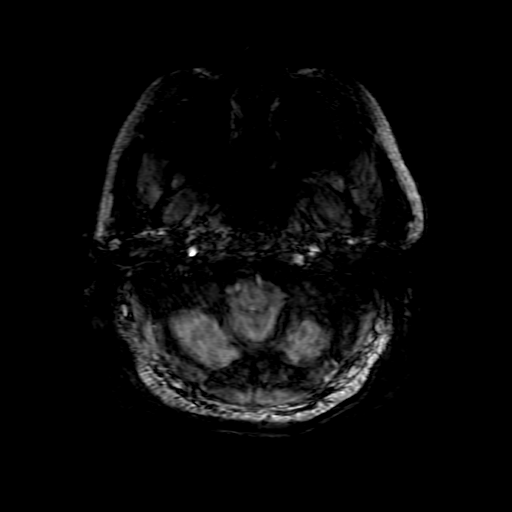
[im 25/100]
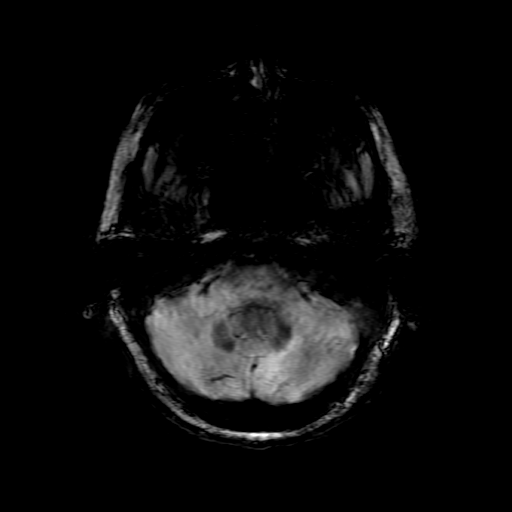

[Series 9: T2 · coronal · 5.0mm · 0.39mm/px · 3 of 29 slices shown (2 of 2)]
[im 1/29]
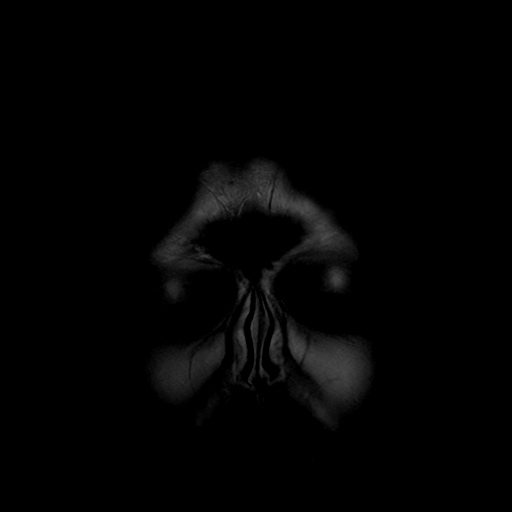
[im 15/29]
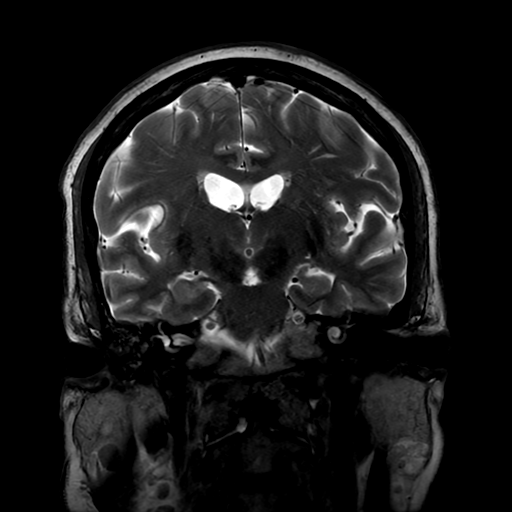
[im 29/29]
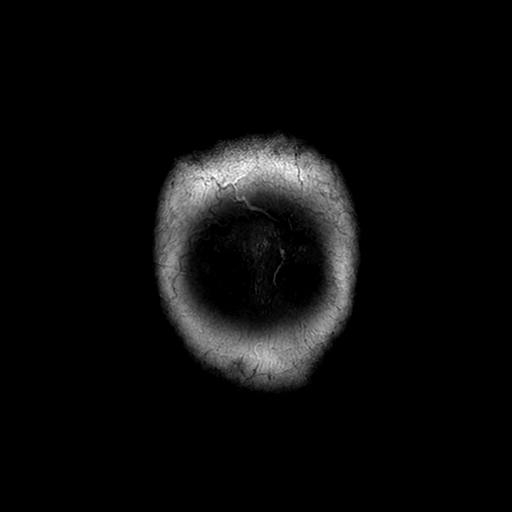

[Series 250: ADC · axial · 3.0mm · 0.94mm/px · z∈[-81,+60]mm · 5 of 50 slices shown (1 of 2)]
[im 1/50]
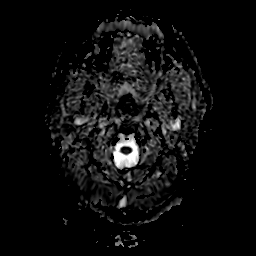
[im 13/50]
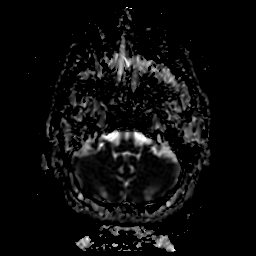
[im 25/50]
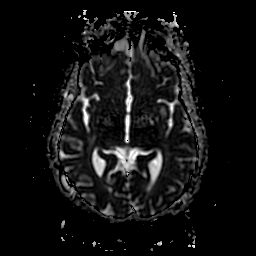
[im 37/50]
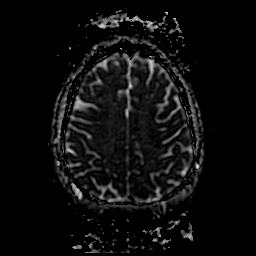
[im 50/50]
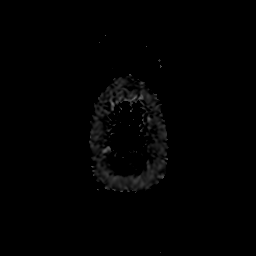

[Series 350: ADC · coronal · 4.0mm · 0.94mm/px · 3 of 35 slices shown (2 of 2)]
[im 1/35]
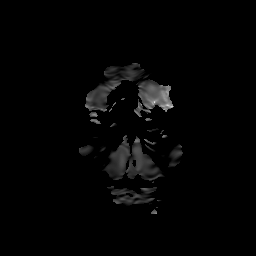
[im 18/35]
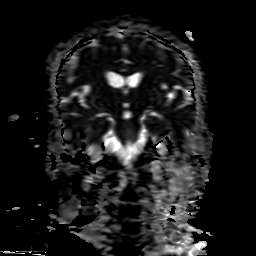
[im 35/35]
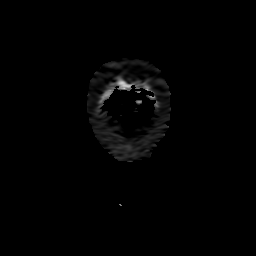

[35 of 48 positions shown; findings below may reference images not displayed]

FINDINGS: Brain: Cerebral volume within normal limits for age. Scattered
patchy T2/FLAIR hyperintensity within the periventricular deep white
matter both cerebral hemispheres, nonspecific, but most likely
related chronic microvascular ischemic disease.

No abnormal foci of restricted diffusion to suggest acute or
subacute ischemia. Gray-white matter differentiation maintained. No
encephalomalacia to suggest chronic cortical infarction. No evidence
for acute or chronic intracranial hemorrhage.

No mass lesion, midline shift or mass effect. No hydrocephalus or
extra-axial fluid collection. Pituitary gland suprasellar region
within normal limits. Midline structures intact.

Vascular: Major intracranial vascular flow voids are maintained.

Skull and upper cervical spine: Craniocervical junction within
normal limits. Bone marrow signal intensity normal. No scalp soft
tissue abnormality.

Sinuses/Orbits: Patient status post bilateral ocular lens
replacement. Paranasal sinuses are largely clear. Small right
mastoid effusion noted. Inner ear structures grossly normal.

Other: None.
IMPRESSION: 1. No acute intracranial abnormality.
2. Mild cerebral white matter disease, nonspecific, but most
commonly related to chronic microvascular ischemic disease.

## 2020-06-29 IMAGING — CT CT HEAD W/O CM
3 series · 16 of 47 positions shown, 19 images · non-contrast
Comparison: [DATE]

CLINICAL DATA: Neuro deficit

Suspect stroke
Left hand numbness
EXAM:
CT HEAD WITHOUT CONTRAST
TECHNIQUE: Contiguous axial images were obtained from the base of the skull
through the vertex without intravenous contrast.

[Series 3: head 5.0 h30s · axial · 0.40mm/px · z∈[-74,+51]mm · 10 of 31 slices shown, 13 images]
[im 3/31  brain]
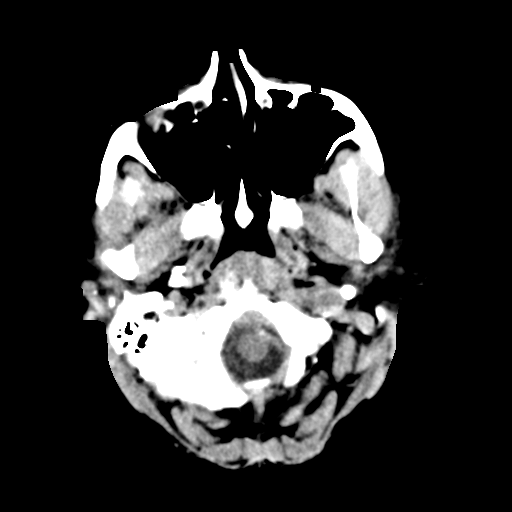
[im 3/31  bone]
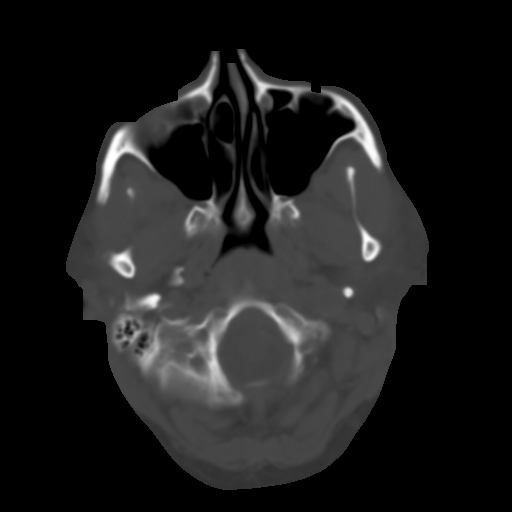
[im 6/31  brain]
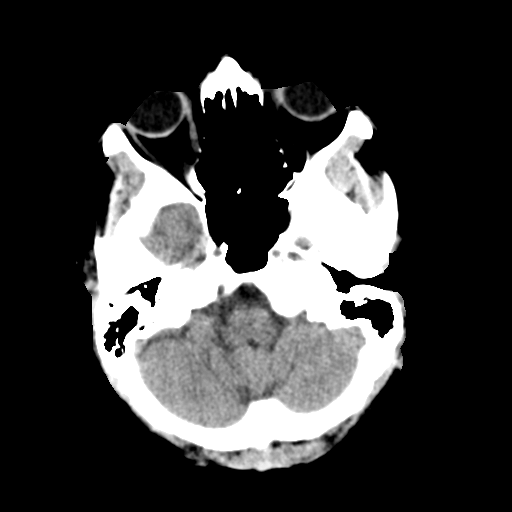
[im 9/31  brain]
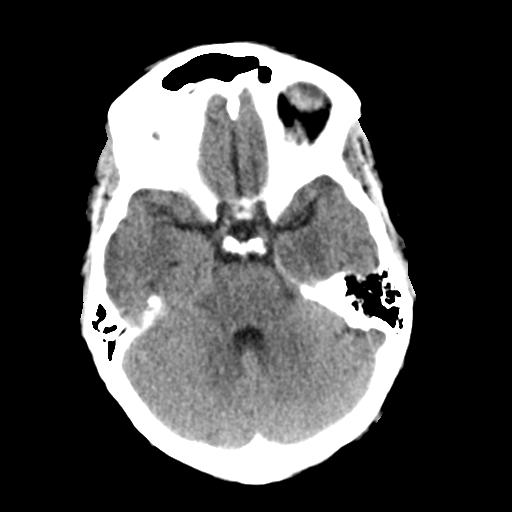
[im 11/31  brain]
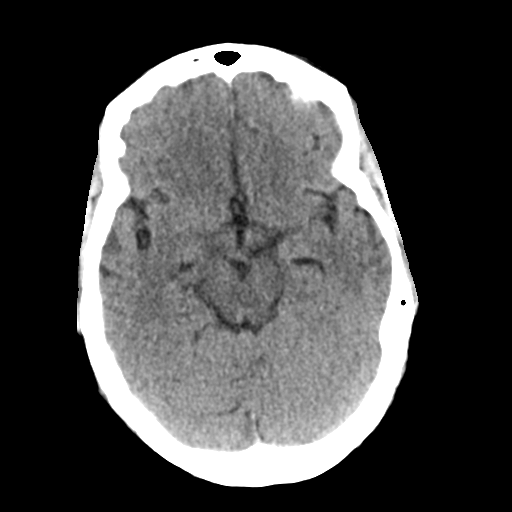
[im 14/31  brain]
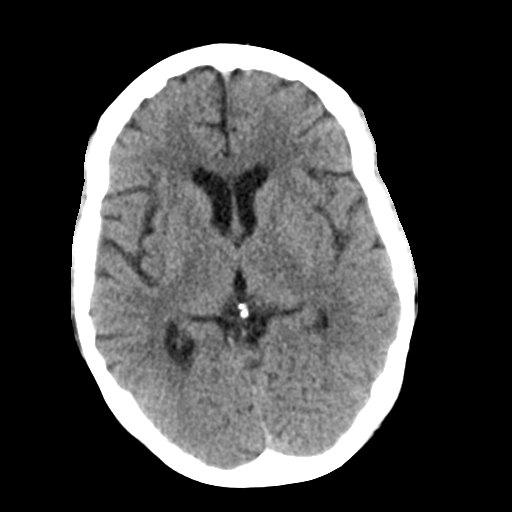
[im 14/31  bone]
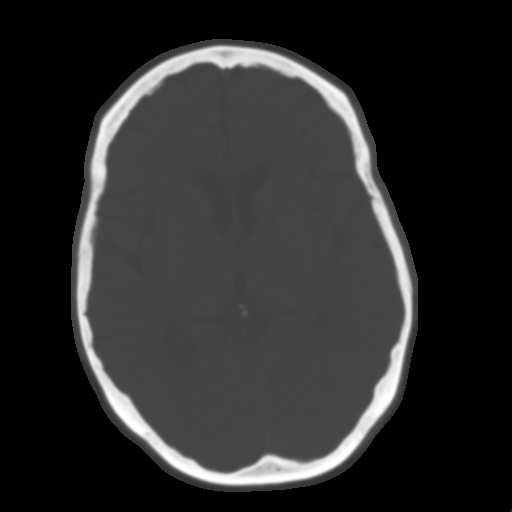
[im 17/31  brain]
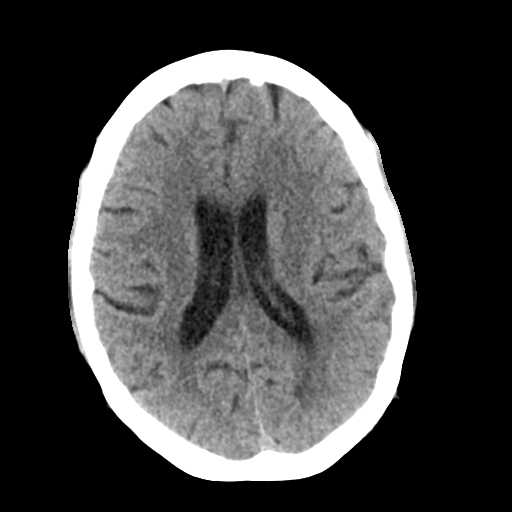
[im 20/31  brain]
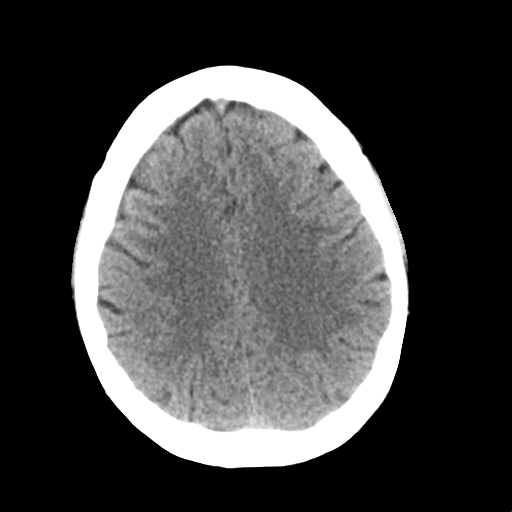
[im 23/31  brain]
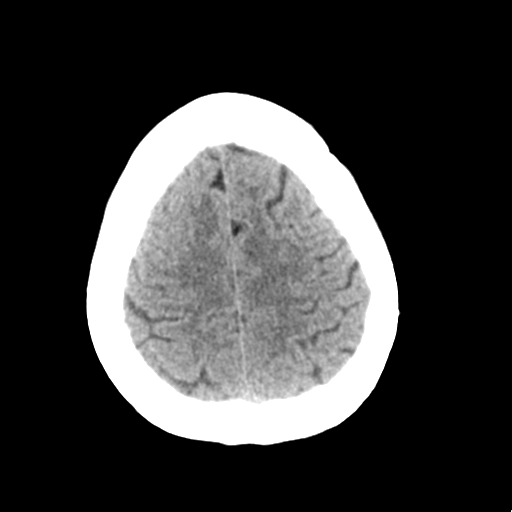
[im 25/31  brain]
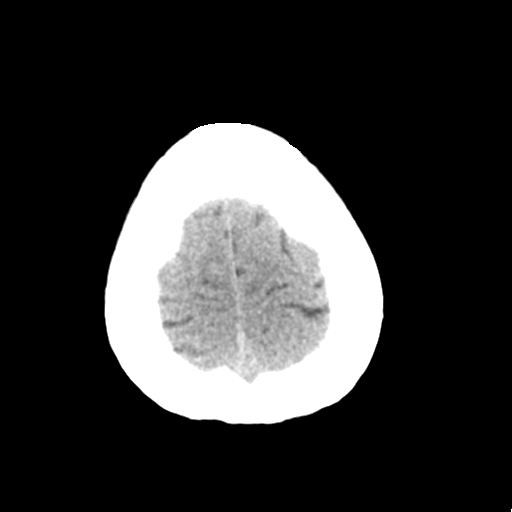
[im 25/31  bone]
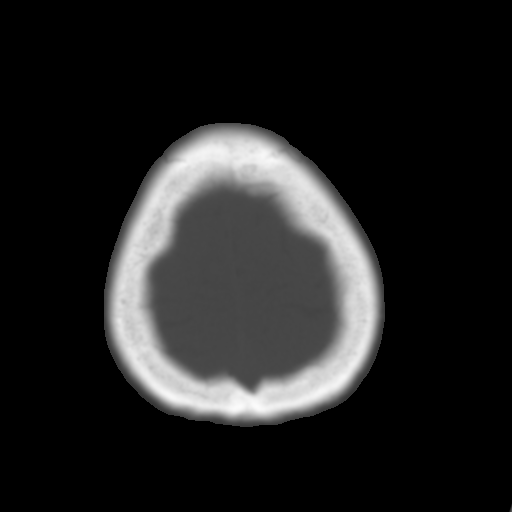
[im 28/31  brain]
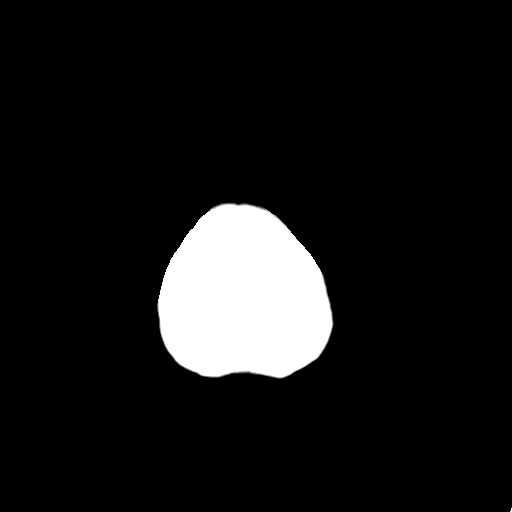

[Series 5: head 3.0 mpr cor · coronal · 0.33mm/px · 3 of 67 slices shown]
[im 23/67  brain]
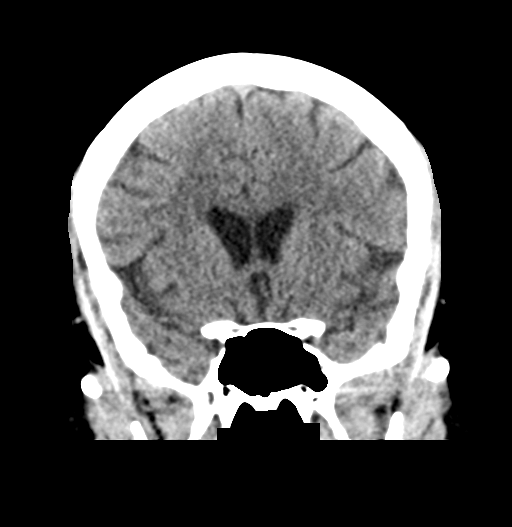
[im 30/67  brain]
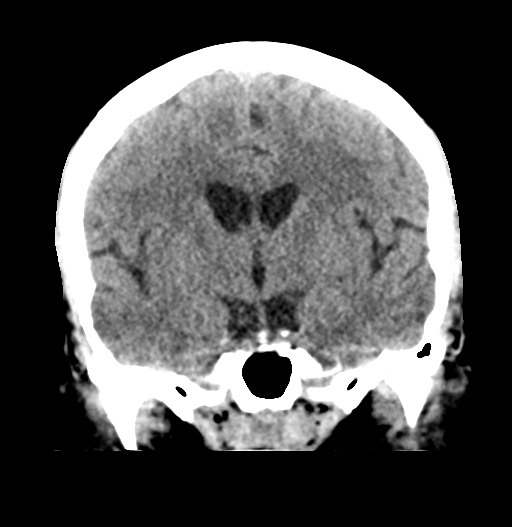
[im 37/67  brain]
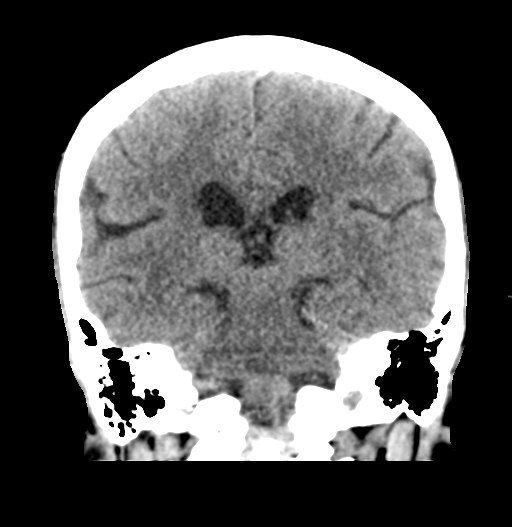

[Series 6: head 3.0 mpr sag · sagittal · 0.30mm/px · 3 of 67 slices shown]
[im 23/67  brain]
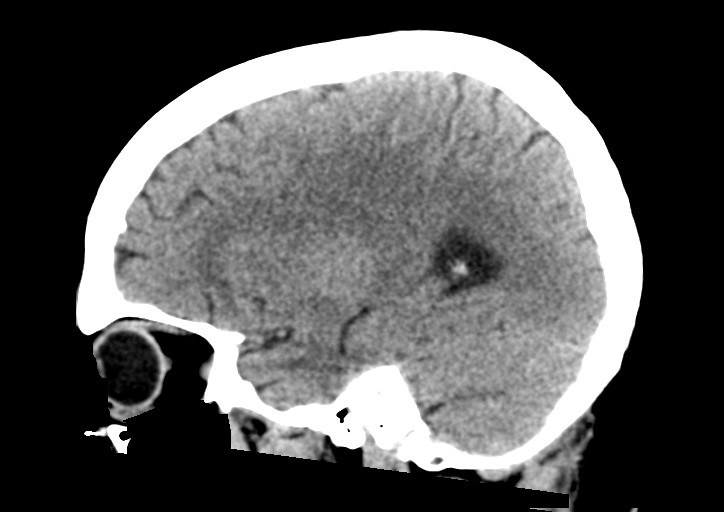
[im 34/67  brain]
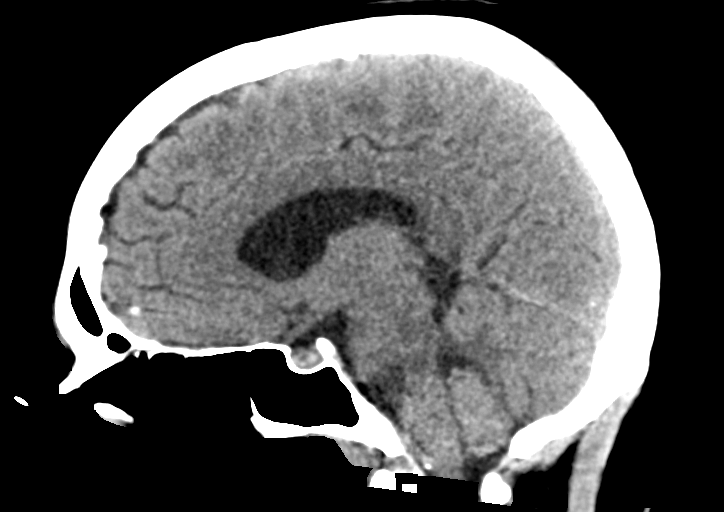
[im 45/67  brain]
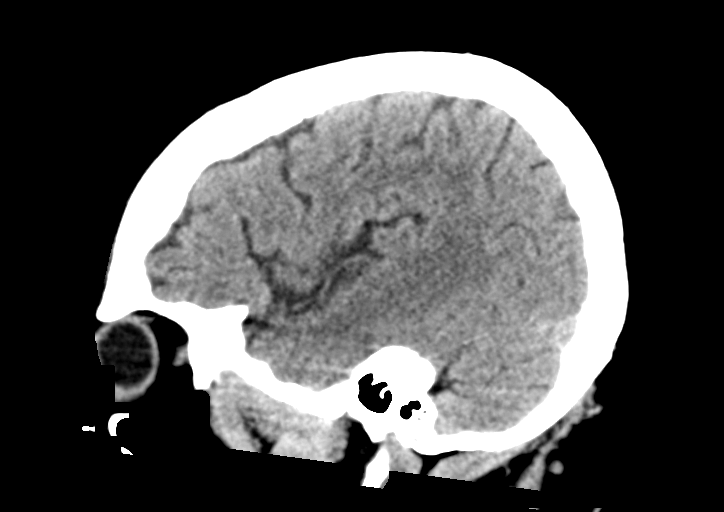

[16 of 47 positions shown; findings below may reference images not displayed]

FINDINGS: Brain: No evidence of acute infarction, hemorrhage, hydrocephalus,
extra-axial collection or mass lesion/mass effect.

Vascular: No hyperdense vessel or unexpected calcification.

Skull: Normal. Negative for fracture or focal lesion.

Sinuses/Orbits: No acute finding.

Other: None.
IMPRESSION: No acute intracranial abnormality.

## 2020-06-29 MED ORDER — SODIUM CHLORIDE 0.9% FLUSH: 3.0000 mL | Freq: Once | INTRAVENOUS | Status: DC

## 2020-06-29 NOTE — ED Notes (Signed)
Pt transported to MRI 

## 2020-06-29 NOTE — ED Triage Notes (Signed)
Pt reports L forearm, L leg, and L foot numbness since yesterday. Reports feeling off balance when walking today and her mouth was droopy when she was trying to eat breakfast this morning. Reports headache for a couple of days this week.

## 2020-06-29 NOTE — ED Notes (Signed)
Patient transported to MRI 

## 2020-06-29 NOTE — ED Notes (Signed)
Patient reports she forgot to mention that she has been having severe neck pain x 1 week, MD notified and new MRI order placed.

## 2020-06-29 NOTE — Discharge Instructions (Signed)
Your history and exam today were consistent with either stroke or TIA.  The MRI does not show evidence of acute stroke but clinically I suspect there may be something very small like a TIA causing the transient symptoms that are improving as you were being monitored here.  I spoke to neurology feel you are safe for discharge home to follow-up with your neurology team.  They also request you follow-up with your PCP.  Please rest and stay hydrated and continue your medications.  If any symptoms change or worsen acutely, please return to the nearest emergency department.

## 2020-06-29 NOTE — ED Provider Notes (Signed)
[name] Provider Note   CSN: [id number] Arrival date & time: 06/29/20  1348     History Chief Complaint  Patient presents with  . Numbness    [name] is a 55 y.o. female.  The history is provided by the patient and medical records. No language interpreter was used.  Neurologic Problem This is a new problem. The current episode started 12 to 24 hours ago. The problem occurs constantly. The problem has been gradually improving. Pertinent negatives include no chest pain, no abdominal pain, no headaches and no shortness of breath. Nothing aggravates the symptoms. Nothing relieves the symptoms. She has tried nothing for the symptoms. The treatment provided no relief.       Past Medical History:  Diagnosis Date  . ADHD   . Arthralgia   . Depression   . Diabetes mellitus without complication ([place])   . Diabetic retinopathy ([place])   . Glaucoma   . Hyperlipidemia   . Hypertension   . Macular edema   . Visual impairment     Patient Active Problem List   Diagnosis Date Noted  . DIABETES MELLITUS, TYPE II 01/01/2007  . HYPERLIPIDEMIA 01/01/2007  . HYPERTENSION 01/01/2007  . PERIPHERAL VASCULAR DISEASE 01/01/2007    Past Surgical History:  Procedure Laterality Date  . CATARACT EXTRACTION     left eye  . CESAREAN SECTION    . left foot surgery       OB History   No obstetric history on file.     Family History  Problem Relation Age of Onset  . Stroke Father   . Stroke Paternal Grandfather     Social History   Tobacco Use  . Smoking status: Never Smoker  . Smokeless tobacco: Never Used  Substance Use Topics  . Alcohol use: Not Currently  . Drug use: Not Currently    Home Medications Prior to Admission medications   Medication Sig Start Date End Date Taking? Authorizing Provider  acetaminophen (TYLENOL) 325 MG tablet Take 650 mg by mouth every 6 (six) hours as needed for mild pain or fever.  10/03/14    [provider]  albuterol (PROVENTIL HFA;VENTOLIN HFA) 108 (90 Base) MCG/ACT inhaler Inhale 1-2 puffs into the lungs every 6 (six) hours as needed for wheezing or shortness of breath.     [provider]  amphetamine-dextroamphetamine (ADDERALL) 20 MG tablet Take 20 mg by mouth 2 (two) times daily.  09/02/17   [provider]  aspirin EC 81 MG tablet Take 81 mg by mouth daily.     [provider]  buPROPion (WELLBUTRIN SR) 150 MG 12 hr tablet Take 150 mg by mouth 2 (two) times daily. 01/27/18   [provider]  cephALEXin (KEFLEX) 500 MG capsule Take 1 capsule (500 mg total) by mouth 4 (four) times daily. 06/29/18   Palumbo, April, MD  dorzolamide-timolol (COSOPT) 22.3-6.8 MG/ML ophthalmic solution Place 1 drop into both eyes 2 (two) times daily.  08/03/17   [provider]  DULoxetine (CYMBALTA) 60 MG capsule Take 60 mg by mouth 2 (two) times daily.  12/22/17   [provider]  fexofenadine (ALLEGRA) 180 MG tablet Take 180 mg by mouth daily.    [provider]  hydrOXYzine (ATARAX/VISTARIL) 25 MG tablet Take 25 mg by mouth 3 (three) times daily.  07/27/17   [provider]  insulin NPH Human (HUMULIN N,NOVOLIN N) 100 UNIT/ML injection Inject 10 Units into the skin 3 (three)  times daily before meals.    [provider]  latanoprost (XALATAN) 0.005 % ophthalmic solution Place 1 drop into both eyes at bedtime.  11/10/17   [provider]  lisinopril-hydrochlorothiazide (PRINZIDE,ZESTORETIC) 20-12.5 MG tablet Take 1 tablet by mouth daily.    [provider]  lovastatin (MEVACOR) 20 MG tablet Take 20 mg by mouth at bedtime.     [provider]  metFORMIN (GLUCOPHAGE-XR) 500 MG 24 hr tablet Take 1,000 mg by mouth 2 (two) times daily.     [provider]  polyvinyl alcohol (LIQUIFILM TEARS) 1.4 % ophthalmic solution Place 1 drop into both eyes 2 (two) times daily as needed for dry eyes.      [provider]  rOPINIRole (REQUIP) 0.5 MG tablet Take 0.5 mg by mouth at bedtime. 03/29/18   [provider]    Allergies    Pb-hyoscy-atropine-scopolamine, Canagliflozin, Latex, Phenobarbital-belladonna alk, and Sulfamethoxazole  Review of Systems   Review of Systems  Constitutional: Negative for chills, fatigue and fever.  HENT: Negative for congestion, ear pain and sore throat.   Eyes: Negative for pain and visual disturbance.  Respiratory: Negative for cough, chest tightness and shortness of breath.   Cardiovascular: Negative for chest pain and palpitations.  Gastrointestinal: Negative for abdominal pain, constipation, diarrhea, nausea and vomiting.  Genitourinary: Negative for dysuria and hematuria.  Musculoskeletal: Negative for arthralgias, back pain, neck pain and neck stiffness.  Skin: Negative for color change and rash.  Neurological: Positive for weakness and numbness. Negative for dizziness, seizures, syncope, speech difficulty, light-headedness and headaches.  All other systems reviewed and are negative.   Physical Exam Updated Vital Signs BP (!) 141/76   Pulse 94   Temp 99.1 F (37.3 C) (Oral)   Resp 15   SpO2 99%   Physical Exam Vitals and nursing note reviewed.  Constitutional:      General: She is not in acute distress.    Appearance: She is well-developed. She is not ill-appearing, toxic-appearing or diaphoretic.  HENT:     Head: Normocephalic and atraumatic.     Right Ear: External ear normal.     Left Ear: External ear normal.     Nose: Nose normal. No congestion or rhinorrhea.     Mouth/Throat:     Mouth: Mucous membranes are moist.     Pharynx: No oropharyngeal exudate or posterior oropharyngeal erythema.  Eyes:     Conjunctiva/sclera: Conjunctivae normal.     Pupils: Pupils are equal, round, and reactive to light.  Cardiovascular:     Rate and Rhythm: Normal rate.     Pulses: Normal pulses.     Heart sounds: No murmur  heard.   Pulmonary:     Effort: No respiratory distress.     Breath sounds: No stridor. No wheezing, rhonchi or rales.  Chest:     Chest wall: No tenderness.  Abdominal:     General: Abdomen is flat. There is no distension.     Tenderness: There is no abdominal tenderness. There is no right CVA tenderness, left CVA tenderness, guarding or rebound.  Musculoskeletal:        General: No tenderness.     Cervical back: Normal range of motion and neck supple. No tenderness.     Right lower leg: No edema.     Left lower leg: No edema.  Skin:    General: Skin is warm.     Capillary Refill: Capillary refill takes less than 2 seconds.  Findings: No erythema or rash.  Neurological:     Mental Status: She is alert and oriented to person, place, and time.     Sensory: Sensory deficit present.     Motor: Weakness present. No abnormal muscle tone.     Coordination: Coordination normal. Finger-Nose-Finger Test normal.     Deep Tendon Reflexes: Reflexes are normal and symmetric.     Comments: Left face, left arm, and left leg numbness.  Left wrist, left arm, and left leg weakness compared to right side.  Extremities are subtle and discrepancy.  Clear speech.  Pupil symmetric and reactive normal extraocular movements.  Exam otherwise unremarkable.  Psychiatric:        Mood and Affect: Mood normal.     ED Results / Procedures / Treatments   Labs (all labs ordered are listed, but only abnormal results are displayed) Labs Reviewed  CBC - Abnormal; Notable for the following components:      Result Value   WBC 10.9 (*)    Hemoglobin 11.3 (*)    Platelets 540 (*)    All other components within normal limits  DIFFERENTIAL - Abnormal; Notable for the following components:   Eosinophils Absolute 0.8 (*)    All other components within normal limits  COMPREHENSIVE METABOLIC PANEL - Abnormal; Notable for the following components:   Potassium 5.3 (*)    Glucose, Bld 341 (*)    Creatinine, Ser  1.41 (*)    Albumin 3.2 (*)    ALT 59 (*)    Alkaline Phosphatase 252 (*)    GFR, Estimated 45 (*)    All other components within normal limits  I-STAT CHEM 8, ED - Abnormal; Notable for the following components:   Potassium 5.3 (*)    BUN 22 (*)    Creatinine, Ser 1.30 (*)    Glucose, Bld 340 (*)    Hemoglobin 11.9 (*)    HCT 35.0 (*)    All other components within normal limits  I-STAT BETA HCG BLOOD, ED ([facility], [facility], [facility] ONLY) - Abnormal; Notable for the following components:   I-stat hCG, quantitative 13.7 (*)    All other components within normal limits  PROTIME-INR  APTT  CBG MONITORING, ED    EKG EKG Interpretation  Date/Time:  Monday June 29 2020 13:58:25 EDT Ventricular Rate:  96 PR Interval:  126 QRS Duration: 78 QT Interval:  336 QTC Calculation: 424 R Axis:   63 Text Interpretation: Normal sinus rhythm Cannot rule out Anterior infarct , age undetermined Abnormal ECG When compared to prior, similar appearance with slower rate No STEMI Confirmed by Antony Blackbird [id number]) on 06/29/2020 7:39:49 PM   Radiology CT HEAD WO CONTRAST  Result Date: 06/29/2020 CLINICAL DATA:  Neuro deficit Suspect stroke Left hand numbness EXAM: CT HEAD WITHOUT CONTRAST TECHNIQUE: Contiguous axial images were obtained from the base of the skull through the vertex without intravenous contrast. COMPARISON:  06/28/2018 FINDINGS: Brain: No evidence of acute infarction, hemorrhage, hydrocephalus, extra-axial collection or mass lesion/mass effect. Vascular: No hyperdense vessel or unexpected calcification. Skull: Normal. Negative for fracture or focal lesion. Sinuses/Orbits: No acute finding. Other: None. IMPRESSION: No acute intracranial abnormality. Electronically Signed   By: Miachel Roux M.D.   On: 06/29/2020 15:11   MR BRAIN WO CONTRAST  Result Date: 06/29/2020 CLINICAL DATA:  Initial evaluation for acute left-sided numbness and weakness. EXAM: MRI HEAD WITHOUT CONTRAST TECHNIQUE: Multiplanar,  multiecho pulse sequences of the brain and surrounding structures were obtained without intravenous contrast.  COMPARISON:  Prior head CT from earlier the same day. FINDINGS: Brain: Cerebral volume within normal limits for age. Scattered patchy T2/FLAIR hyperintensity within the periventricular deep white matter both cerebral hemispheres, nonspecific, but most likely related chronic microvascular ischemic disease. No abnormal foci of restricted diffusion to suggest acute or subacute ischemia. Gray-white matter differentiation maintained. No encephalomalacia to suggest chronic cortical infarction. No evidence for acute or chronic intracranial hemorrhage. No mass lesion, midline shift or mass effect. No hydrocephalus or extra-axial fluid collection. Pituitary gland suprasellar region within normal limits. Midline structures intact. Vascular: Major intracranial vascular flow voids are maintained. Skull and upper cervical spine: Craniocervical junction within normal limits. Bone marrow signal intensity normal. No scalp soft tissue abnormality. Sinuses/Orbits: Patient status post bilateral ocular lens replacement. Paranasal sinuses are largely clear. Small right mastoid effusion noted. Inner ear structures grossly normal. Other: None. IMPRESSION: 1. No acute intracranial abnormality. 2. Mild cerebral white matter disease, nonspecific, but most commonly related to chronic microvascular ischemic disease. Electronically Signed   By: Jeannine Boga M.D.   On: 06/29/2020 19:28    Procedures Procedures   Medications Ordered in ED Medications  sodium chloride flush (NS) 0.9 % injection 3 mL (has no administration in time range)    ED Course  I have reviewed the triage vital signs and the nursing notes.  Pertinent labs & imaging results that were available during my care of the patient were reviewed by me and considered in my medical decision making (see chart for details).    MDM Rules/Calculators/A&P                           [name] is a 55 y.o. female with a past medical history significant for hypertension, hyperlipidemia, diabetes, peripheral vascular disease, and report of prior TIA/stroke who presents with left-sided numbness and weakness.  Patient reports that last night, she noticed left arm tingling.  She thought it might have been carpal tunnel but then this morning woke up and she had left facial droop when trying to biscuit, left arm numbness, left arm weakness, and left leg symptoms as well.  She reports her whole left side feels numb and weak although she does report it started to feel better than it was when she first woke up this morning now.  She is starting to improve.  She denies any headache or new head injury.  She denies any right-sided symptoms.  She reports that years ago, she did have a right-sided similar episode that had some numbness and weakness.  She denies any visual changes or speech difficulties at this time.  She reports she has felt well otherwise.  She did have some headaches last week that have resolved.  She denies other complaints.  On exam, patient has numbness in her left face, left arm, left leg compared to right.  She is weakness in her left arm and left leg that are subtle.  Subtle left facial droop as well.  Pupils are symmetric and have normal extraocular movements.  Clear speech.  No carotid bruit appreciated.  Chest abdomen nontender.  Normal breath sounds and abdominal sounds.  Clinically I do suspect patient had a stroke.  I spoke with neurology who recommended MRI brain without to further evaluate as she already had a CT Noncon in triage.  Anticipate reassessment after work-up to determine disposition.   MRI returned and did not show any evidence of acute stroke.  I spoke  to neurology who also reviewed the images and agree.  Suspect mild and improving TIA symptoms.  As patient has neurology team in place and has improving symptoms, we had  neurology feel patient is a for discharge home.  They did not feel she needed any medication changes.  Patient does need to continue her medications and follow-up with neurology in the next week or so.  If any symptoms change or worsen, they know to return.  She no depressions or concerns and was discharged in good condition.   Final Clinical Impression(s) / ED Diagnoses Final diagnoses:  Left sided numbness  Facial droop  Left-sided weakness     Clinical Impression: 1. Left sided numbness   2. Facial droop   3. Left-sided weakness     Disposition: Discharge  Condition: Good  I have discussed the results, Dx and Tx plan with the pt(& family if present). He/she/they expressed understanding and agree(s) with the plan. Discharge instructions discussed at great length. Strict return precautions discussed and pt &/or family have verbalized understanding of the instructions. No further questions at time of discharge.    New Prescriptions   No medications on file    Follow Up: Garvin Fila, [place] [id number] [phone]     Philmore Pali, NP [place] [address] [place] [address] [id number] mc [place]       Mylik Pro, Gwenyth Allegra, MD 06/29/20 2032

## 2020-06-29 NOTE — ED Triage Notes (Signed)
Emergency Medicine Provider Triage Evaluation Note  [name] , a 55 y.o. female  was evaluated in triage.  Pt complains of left leg and arm tingling and weakness and left sided facial droop since 8-9pm last night.  Patient is blind at baseline. No acute vision change.  She did have a headache last week.   Review of Systems  Positive: Facial weakness, left sided arm and leg weakness Negative: fevers  Physical Exam  BP (!) 167/89 (BP Location: Right Arm)   Pulse (!) 102   Temp 99.1 F (37.3 C) (Oral)   Resp (!) 22   SpO2 100%  Gen:   Awake, no distress   HEENT:  Atraumatic, left sided facial droop,  Resp:  Normal effort  Cardiac:  Normal rate  Abd:   Nondistended, nontender  MSK:   Left arm with mild drift.  Left leg with 4/5 strength compared to right at 5/5.  Able to ambulate with mild left sided weakness.   Neuro:  Speech clear.   Medical Decision Making  Medically screening exam initiated at 2:13 PM.  Appropriate orders placed.  [place] was informed that the remainder of the evaluation will be completed by another provider, this initial triage assessment does not replace that evaluation, and the importance of remaining in the ED until their evaluation is complete.   Patient outside of code stroke window, does not appear to meet LVO criteria at this time, no vision change, aphasia or neglect.  Clinical Impression  Left sided weakness.    Lorin Glass, [place] 06/29/20 1426

## 2020-07-01 ENCOUNTER — Emergency Department (HOSPITAL_COMMUNITY)
Admission: EM | Admit: 2020-07-01 | Discharge: 2020-07-01 | Disposition: A | Discharge status: Home / Self Care | Payer: Medicare Other | Attending: Emergency Medicine | Admitting: Emergency Medicine

## 2020-07-01 ENCOUNTER — Other Ambulatory Visit: Payer: Self-pay

## 2020-07-01 DIAGNOSIS — Z79899 Other long term (current) drug therapy: Secondary | ICD-10-CM | POA: Diagnosis not present

## 2020-07-01 DIAGNOSIS — G510 Bell's palsy: Secondary | ICD-10-CM | POA: Insufficient documentation

## 2020-07-01 DIAGNOSIS — Z794 Long term (current) use of insulin: Secondary | ICD-10-CM | POA: Diagnosis not present

## 2020-07-01 DIAGNOSIS — Z91040 Latex allergy status: Secondary | ICD-10-CM | POA: Insufficient documentation

## 2020-07-01 DIAGNOSIS — Z7984 Long term (current) use of oral hypoglycemic drugs: Secondary | ICD-10-CM | POA: Diagnosis not present

## 2020-07-01 DIAGNOSIS — Z7982 Long term (current) use of aspirin: Secondary | ICD-10-CM | POA: Diagnosis not present

## 2020-07-01 DIAGNOSIS — I10 Essential (primary) hypertension: Secondary | ICD-10-CM | POA: Diagnosis not present

## 2020-07-01 DIAGNOSIS — E11319 Type 2 diabetes mellitus with unspecified diabetic retinopathy without macular edema: Secondary | ICD-10-CM | POA: Diagnosis not present

## 2020-07-01 DIAGNOSIS — R531 Weakness: Secondary | ICD-10-CM | POA: Diagnosis present

## 2020-07-01 MED ORDER — VALACYCLOVIR HCL 500 MG PO TABS: 500.0000 mg | ORAL_TABLET | Freq: Three times a day (TID) | ORAL | 0 refills | Status: AC

## 2020-07-01 NOTE — ED Provider Notes (Signed)
[facility] EMERGENCY DEPARTMENT Provider Note   CSN: [id number] Arrival date & time: 07/01/20  6286     History Chief Complaint  Patient presents with  . Weakness    Jill [name] is a 55 y.o. female.  Patient is a 55 year old female with history of hypertension, diabetes, peripheral vascular disease.  Patient presents today for evaluation of left facial numbness and droop as well as weakness of the left arm and leg.  Patient had similar symptoms 2 days ago and was seen here for this.  She had an MRI of the brain and cervical spine, both of which were essentially unremarkable.  Patient was discharged with possible TIA and told to return if she worsens.  She went to bed last night at 11 feeling well, then woke up at 3 AM with recurrent facial numbness.  She attempted to ambulate to the kitchen to get something to drink and stated she was having difficulty walking due to weakness in her left leg.  She attempted to drink water, however it did not want to stay in her mouth.  She denies to me she is having any headache or visual disturbances.  The history is provided by the patient.       Past Medical History:  Diagnosis Date  . ADHD   . Arthralgia   . Depression   . Diabetes mellitus without complication ([place])   . Diabetic retinopathy ([place])   . Glaucoma   . Hyperlipidemia   . Hypertension   . Macular edema   . Visual impairment     Patient Active Problem List   Diagnosis Date Noted  . DIABETES MELLITUS, TYPE II 01/01/2007  . HYPERLIPIDEMIA 01/01/2007  . HYPERTENSION 01/01/2007  . PERIPHERAL VASCULAR DISEASE 01/01/2007    Past Surgical History:  Procedure Laterality Date  . CATARACT EXTRACTION     left eye  . CESAREAN SECTION    . left foot surgery       OB History   No obstetric history on file.     Family History  Problem Relation Age of Onset  . Stroke Father   . Stroke Paternal Grandfather     Social History   Tobacco Use   . Smoking status: Never Smoker  . Smokeless tobacco: Never Used  Substance Use Topics  . Alcohol use: Not Currently  . Drug use: Not Currently    Home Medications Prior to Admission medications   Medication Sig Start Date End Date Taking? Authorizing Provider  acetaminophen (TYLENOL) 325 MG tablet Take 650 mg by mouth every 6 (six) hours as needed for mild pain or fever.  10/03/14   [provider]  albuterol (PROVENTIL HFA;VENTOLIN HFA) 108 (90 Base) MCG/ACT inhaler Inhale 1-2 puffs into the lungs every 6 (six) hours as needed for wheezing or shortness of breath.     [provider]  amphetamine-dextroamphetamine (ADDERALL) 20 MG tablet Take 20 mg by mouth 2 (two) times daily.  09/02/17   [provider]  aspirin EC 81 MG tablet Take 81 mg by mouth daily.     [provider]  buPROPion (WELLBUTRIN SR) 150 MG 12 hr tablet Take 150 mg by mouth 2 (two) times daily. 01/27/18   [provider]  cephALEXin (KEFLEX) 500 MG capsule Take 1 capsule (500 mg total) by mouth 4 (four) times daily. 06/29/18   Palumbo, April, MD  dorzolamide-timolol (COSOPT) 22.3-6.8 MG/ML ophthalmic solution Place 1 drop into both eyes 2 (two) times daily.  08/03/17   [provider]  DULoxetine (CYMBALTA) 60 MG capsule Take 60 mg by mouth 2 (two) times daily.  12/22/17   [provider]  fexofenadine (ALLEGRA) 180 MG tablet Take 180 mg by mouth daily.    [provider]  hydrOXYzine (ATARAX/VISTARIL) 25 MG tablet Take 25 mg by mouth 3 (three) times daily.  07/27/17   [provider]  insulin NPH Human (HUMULIN N,NOVOLIN N) 100 UNIT/ML injection Inject 10 Units into the skin 3 (three) times daily before meals.    [provider]  latanoprost (XALATAN) 0.005 % ophthalmic solution Place 1 drop into both eyes at bedtime.  11/10/17   [provider]  lisinopril-hydrochlorothiazide (PRINZIDE,ZESTORETIC) 20-12.5 MG tablet Take 1 tablet by mouth  daily.    [provider]  lovastatin (MEVACOR) 20 MG tablet Take 20 mg by mouth at bedtime.     [provider]  metFORMIN (GLUCOPHAGE-XR) 500 MG 24 hr tablet Take 1,000 mg by mouth 2 (two) times daily.     [provider]  polyvinyl alcohol (LIQUIFILM TEARS) 1.4 % ophthalmic solution Place 1 drop into both eyes 2 (two) times daily as needed for dry eyes.     [provider]  rOPINIRole (REQUIP) 0.5 MG tablet Take 0.5 mg by mouth at bedtime. 03/29/18   [provider]    Allergies    Pb-hyoscy-atropine-scopolamine, Canagliflozin, Latex, Phenobarbital-belladonna alk, and Sulfamethoxazole  Review of Systems   Review of Systems  All other systems reviewed and are negative.   Physical Exam Updated Vital Signs BP 136/83   Pulse 94   Temp 98.6 F (37 C) (Oral)   Resp 15   Ht 5' 1"  (1.549 m)   Wt 86.2 kg   SpO2 97%   BMI 35.91 kg/m   Physical Exam Vitals and nursing note reviewed.  Constitutional:      General: She is not in acute distress.    Appearance: She is well-developed. She is not diaphoretic.  HENT:     Head: Normocephalic and atraumatic.  Cardiovascular:     Rate and Rhythm: Normal rate and regular rhythm.     Heart sounds: No murmur heard. No friction rub. No gallop.   Pulmonary:     Effort: Pulmonary effort is normal. No respiratory distress.     Breath sounds: Normal breath sounds. No wheezing.  Abdominal:     General: Bowel sounds are normal. There is no distension.     Palpations: Abdomen is soft.     Tenderness: There is no abdominal tenderness.  Musculoskeletal:        General: Normal range of motion.     Cervical back: Normal range of motion and neck supple.  Skin:    General: Skin is warm and dry.  Neurological:     General: No focal deficit present.     Mental Status: She is alert and oriented to person, place, and time.     Comments: Patient is noted to have a left-sided facial droop.  She also has  weakness of the left eyelid and incomplete closure of the left eye at times.  There is involvement of the forehead musculature as well.  Cranial nerves are otherwise intact.  Strength is 5 out of 5 to the right arm and right leg, and perhaps mildly diminished in the left arm and left leg.     ED Results / Procedures / Treatments   Labs (all labs ordered are listed, but only abnormal results  are displayed) Labs Reviewed - No data to display  EKG None  Radiology CT HEAD WO CONTRAST  Result Date: 06/29/2020 CLINICAL DATA:  Neuro deficit Suspect stroke Left hand numbness EXAM: CT HEAD WITHOUT CONTRAST TECHNIQUE: Contiguous axial images were obtained from the base of the skull through the vertex without intravenous contrast. COMPARISON:  06/28/2018 FINDINGS: Brain: No evidence of acute infarction, hemorrhage, hydrocephalus, extra-axial collection or mass lesion/mass effect. Vascular: No hyperdense vessel or unexpected calcification. Skull: Normal. Negative for fracture or focal lesion. Sinuses/Orbits: No acute finding. Other: None. IMPRESSION: No acute intracranial abnormality. Electronically Signed   By: Miachel Roux M.D.   On: 06/29/2020 15:11   MR BRAIN WO CONTRAST  Result Date: 06/29/2020 CLINICAL DATA:  Initial evaluation for acute left-sided numbness and weakness. EXAM: MRI HEAD WITHOUT CONTRAST TECHNIQUE: Multiplanar, multiecho pulse sequences of the brain and surrounding structures were obtained without intravenous contrast. COMPARISON:  Prior head CT from earlier the same day. FINDINGS: Brain: Cerebral volume within normal limits for age. Scattered patchy T2/FLAIR hyperintensity within the periventricular deep white matter both cerebral hemispheres, nonspecific, but most likely related chronic microvascular ischemic disease. No abnormal foci of restricted diffusion to suggest acute or subacute ischemia. Gray-white matter differentiation maintained. No encephalomalacia to suggest chronic  cortical infarction. No evidence for acute or chronic intracranial hemorrhage. No mass lesion, midline shift or mass effect. No hydrocephalus or extra-axial fluid collection. Pituitary gland suprasellar region within normal limits. Midline structures intact. Vascular: Major intracranial vascular flow voids are maintained. Skull and upper cervical spine: Craniocervical junction within normal limits. Bone marrow signal intensity normal. No scalp soft tissue abnormality. Sinuses/Orbits: Patient status post bilateral ocular lens replacement. Paranasal sinuses are largely clear. Small right mastoid effusion noted. Inner ear structures grossly normal. Other: None. IMPRESSION: 1. No acute intracranial abnormality. 2. Mild cerebral white matter disease, nonspecific, but most commonly related to chronic microvascular ischemic disease. Electronically Signed   By: Jeannine Boga M.D.   On: 06/29/2020 19:28   MR Cervical Spine Wo Contrast  Result Date: 06/29/2020 CLINICAL DATA:  Initial evaluation for severe neck pain for 1 week. EXAM: MRI CERVICAL SPINE WITHOUT CONTRAST TECHNIQUE: Multiplanar, multisequence MR imaging of the cervical spine was performed. No intravenous contrast was administered. COMPARISON:  Prior CT from 11/06/2012 FINDINGS: Alignment: Straightening with mild reversal of the normal cervical lordosis. Trace 2 mm retrolisthesis of C6 on C7, chronic and degenerative. Vertebrae: Vertebral body height maintained without evidence for acute or chronic fracture. Bone marrow signal intensity within normal limits. No discrete or worrisome osseous lesions. Mild discogenic reactive endplate change present about the C5-6 and C6-7 interspaces. No abnormal marrow edema. Cord: Normal signal and morphology. Posterior Fossa, vertebral arteries, paraspinal tissues: 5 mm benign appearing cystic lesion at the posterior aspect of the pituitary gland noted, likely a small pars intermedius cyst. Visualized brain and  posterior fossa otherwise unremarkable. Craniocervical junction within normal limits. Paraspinous and prevertebral soft tissues normal. Normal flow voids seen within the vertebral arteries bilaterally. Disc levels: C2-C3: Normal interspace. Mild facet hypertrophy. No canal or foraminal stenosis. C3-C4: Minimal disc bulge with uncovertebral hypertrophy. No spinal stenosis. Foramina remain patent. C4-C5: Diffuse disc bulge with bilateral uncovertebral hypertrophy. Broad posterior disc osteophyte flattens and partially effaces the ventral thecal sac, asymmetric to the right. Mild spinal stenosis without frank cord impingement. Mild right worse than left C5 foraminal stenosis. C5-C6: Advanced degenerative intervertebral disc space narrowing with diffuse disc osteophyte complex. Broad posterior component flattens and partially effaces the  ventral thecal sac. Resultant moderate spinal stenosis with mild cord flattening, but no cord signal changes. Moderate bilateral C6 foraminal narrowing. C6-C7: Advanced degenerative intervertebral disc space narrowing with diffuse disc osteophyte complex. Broad posterior component effaces the ventral thecal sac with resultant moderate spinal stenosis. Mild cord flattening without cord signal changes. Severe right worse than left C7 foraminal stenosis. C7-T1: Mild disc bulge with right-sided uncovertebral hypertrophy. Mild facet spurring. No spinal stenosis. Mild to moderate right C8 foraminal narrowing. Left neural foramina remains patent. Visualized upper thoracic spine demonstrates mild noncompressive disc bulging at T2-3 without significant stenosis. IMPRESSION: 1. No acute abnormality within the cervical spine. 2. Degenerative disc osteophyte at C5-6 and C6-7 with resultant moderate spinal stenosis, with moderate to severe bilateral C6 and C7 foraminal narrowing as above. 3. Right eccentric disc osteophyte at C4-5 with resultant mild canal and right worse than left C5 foraminal  stenosis. 4. Right-sided uncovertebral hypertrophy at C7-T1 with resultant mild to moderate right C8 foraminal narrowing. Electronically Signed   By: Jeannine Boga M.D.   On: 06/29/2020 22:25    Procedures Procedures   Medications Ordered in ED Medications - No data to display  ED Course  I have reviewed the triage vital signs and the nursing notes.  Pertinent labs & imaging results that were available during my care of the patient were reviewed by me and considered in my medical decision making (see chart for details).    MDM Rules/Calculators/A&P  Patient presenting here with complaints of left-sided facial droop, numbness as described in the HPI.  She was seen for similar complaints just over 24 hours ago.  Her MRI showed no brain abnormality, but some degenerative changes in her neck.  She was discharged with possible TIA versus cervical radiculopathy.  She returns this evening with what appears to be a Bell's palsy.  She has a facial droop on the left along with weakened eye closure.  Her prior scans were reviewed and patient also seen by neurology.  We are in agreement that this is most likely a Bell's palsy and she will be treated with Valtrex.  She is to follow-up with primary doctor and return as needed.  Final Clinical Impression(s) / ED Diagnoses Final diagnoses:  None    Rx / DC Orders ED Discharge Orders    None       Veryl Speak, MD 07/01/20 [phone]

## 2020-07-01 NOTE — ED Notes (Signed)
Patient is undress on monitor

## 2020-07-01 NOTE — ED Triage Notes (Signed)
Ambulatory to triage - came back for same symptoms - left facial asymmetry, numbness to same side, left sided weakness.  No slurring noted.    LKW 2300. Woke up at 0200 today.

## 2020-07-01 NOTE — Discharge Instructions (Signed)
Begin taking valacyclovir as prescribed.  Use Lacri-Lube to your left eye to prevent it from drying out.  Follow-up with primary doctor in the next week, and return to the ER if symptoms significantly worsen or change.

## 2020-07-01 NOTE — ED Provider Notes (Signed)
MSE was initiated and I personally evaluated the patient and placed orders (if any) at  3:22 AM on July 01, 2020.  Patient recently seen for facial droop, left sided weakness, discharged 4/4 with dx TIA woke at 2:00 am with recurrent symptoms. She states the facial droop is worse tonight than before.   Today's Vitals   07/01/20 0319 07/01/20 0320  BP:  (!) 154/98  Pulse:  (!) 105  Resp:  16  Temp:  98.6 F (37 C)  TempSrc:  Oral  SpO2:  99%  Weight:  86.2 kg  Height:  5\' 1"  (1.549 m)  PainSc: 0-No pain    Body mass index is 35.91 kg/m.  Left facial droop present. Left grip weak.   The patient appears stable so that the remainder of the MSE may be completed by another provider.   Charlann Lange, PA-C 07/01/20 1021    Veryl Speak, MD 07/01/20 [phone]

## 2020-07-01 NOTE — ED Notes (Signed)
ED Provider at bedside. 

## 2020-07-01 NOTE — Consult Note (Signed)
Neurology Consultation Reason for Consult: Facial droop and left arm/leg numbness Requesting Physician: Delo  CC: Left facial droop   History is obtained from: Patient and chart review   HPI: Jill [name] is a 55 y.o. female with a past medical history significant for poorly controlled diabetes c/b diabetic retinopathy, hypertension, hyperlipidemia, macular edema, glaucoma, depression.  She reports that she noticed some tingling in her hand on Monday evening (4/4).  She woke up feeling hypoglycemic overnight and felt like she could not like the sugar off of her lips very well.  In the morning on Tuesday she felt like she could not bite a biscuit very well.  She then began to noticed that she is having significant paresthesias in the left leg (laterally as well as the lateral foot), sections of her left arm including her left hand as well as her left face, which she describes as a "gauzy numbness."  She presented to the ED for evaluation of this on Tuesday at which time MRI brain and MRI cervical spine were completed on my curbside recommendations.  These were negative for any acute actionable pathology and she was discharged home.  However she had worsening facial droop today as well of the episode of dizziness and feeling like her left hip may give out on her.  She also had an episode of similar numbness starting in the right posterior of her head and wrapping around her left temple that she has difficulty putting a timeframe on.  She therefore presented again for evaluation.    On review of systems she additionally notes that she has had chronic poor sleep, reduced taste of salty things, an episode of nausea and emesis Tuesday evening after discharge from the ED, chronic anxiety related skin picking, concern for potential spider bite on her right arm that she does not think was unconscious for skin picking about 1 week prior to presentation, sinus infection about 1 week ago with left ear  pain (now resolved).   Regarding her prior TIA in 2019 she reports she had some right leg heaviness.  At this time she was diagnosed with a TIA secondary to small vessel risk factors.  ROS: All other review of systems was negative except as noted in the HPI.   Past Medical History:  Diagnosis Date  . ADHD   . Arthralgia   . Depression   . Diabetes mellitus without complication ([place])   . Diabetic retinopathy ([place])   . Glaucoma   . Hyperlipidemia   . Hypertension   . Macular edema   . Visual impairment    Past Surgical History:  Procedure Laterality Date  . CATARACT EXTRACTION     left eye  . CESAREAN SECTION    . left foot surgery     Current Outpatient Medications  Medication Instructions  . acetaminophen (TYLENOL) 650 mg, Oral, Every 6 hours PRN  . albuterol (PROVENTIL HFA;VENTOLIN HFA) 108 (90 Base) MCG/ACT inhaler 1-2 puffs, Inhalation, Every 6 hours PRN  . amphetamine-dextroamphetamine (ADDERALL) 20 MG tablet 20 mg, Oral, 2 times daily  . aspirin EC 81 mg, Oral, Daily  . buPROPion (WELLBUTRIN SR) 150 mg, Oral, 2 times daily  . cephALEXin (KEFLEX) 500 mg, Oral, 4 times daily  . dorzolamide-timolol (COSOPT) 22.3-6.8 MG/ML ophthalmic solution 1 drop, Both Eyes, 2 times daily  . DULoxetine (CYMBALTA) 60 mg, Oral, 2 times daily  . fexofenadine (ALLEGRA) 180 mg, Oral, Daily  . hydrOXYzine (ATARAX/VISTARIL) 25 mg, Oral, 3 times daily  .  insulin NPH Human (NOVOLIN N) 10 Units, Subcutaneous, 3 times daily before meals  . latanoprost (XALATAN) 0.005 % ophthalmic solution 1 drop, Both Eyes, Daily at bedtime  . lisinopril-hydrochlorothiazide (PRINZIDE,ZESTORETIC) 20-12.5 MG tablet 1 tablet, Oral, Daily  . lovastatin (MEVACOR) 20 mg, Oral, Daily at bedtime  . metFORMIN (GLUCOPHAGE-XR) 1,000 mg, Oral, 2 times daily  . polyvinyl alcohol (LIQUIFILM TEARS) 1.4 % ophthalmic solution 1 drop, Both Eyes, 2 times daily PRN  . rOPINIRole (REQUIP) 0.5 mg, Oral, Daily at bedtime  .  valACYclovir (VALTREX) 500 mg, Oral, 3 times daily     Family History  Problem Relation Age of Onset  . Stroke Father   . Stroke Paternal Grandfather     Social History:  reports that she has never smoked. She has never used smokeless tobacco. She reports previous alcohol use. She reports previous drug use.  Exam: Current vital signs: BP 136/83   Pulse 94   Temp 98.6 F (37 C) (Oral)   Resp 15   Ht _0  (1.549 m)   Wt 86.2 kg   SpO2 97%   BMI 35.91 kg/m  Vital signs in last 24 hours: Temp:  [98.6 F (37 C)] 98.6 F (37 C) (04/06 0320) Pulse Rate:  [94-105] 94 (04/06 0415) Resp:  [15-16] 15 (04/06 0415) BP: (136-154)/(83-98) 136/83 (04/06 0415) SpO2:  [97 %-99 %] 97 % (04/06 0415) Weight:  [86.2 kg] 86.2 kg (04/06 0320)   Physical Exam  Constitutional: Appears well-developed and well-nourished.  Psych: Affect appropriate to situation, occasionally tangential in her answers, but redirectable Eyes: No scleral injection HENT: No oropharyngeal obstruction.  MSK: no joint deformities.  Cardiovascular: Normal rate and regular rhythm.  Respiratory: Effort normal, non-labored breathing GI: Soft.  Obese. Non-tender  Skin:   Neuro: Mental Status: Patient is awake, alert, oriented to person, place, month, year, and situation. Patient is able to give a clear and coherent history. No signs of aphasia or neglect Cranial Nerves: II: Visual Fields are full. Pupils are equal, round, and reactive to light.  Left pupil is slightly larger than the right but both are briskly reactive, left is postsurgical (cataract) III,IV, VI: EOMI without ptosis or diploplia.  V: Facial sensation is symmetric to temperature and to pinprick though subjectively just feels slightly different in a way that is difficult for her to quantify. VII: Facial movement is notable for weak lip closure on the left, slightly weak eye closure, very subtly reduced brow furrowing.  She does not feel that saline is  very salty in any part of her tongue. VIII: hearing is intact to voice; on Weber testing sound is louder on the left X: Uvula elevates symmetrically XI: Shoulder shrug is symmetric. XII: tongue is midline without atrophy or fasciculations.  Motor: Tone is normal. Bulk is normal. 5/5 strength was present in all four extremities, with the exception of mild left wrist dorsiflexion weakness, left finger extensor weakness, and left hip flexor weakness Sensory: Sensation is symmetric to light touch and temperature in the arms and legs, other than a patch of skin with reduced on the right arm Deep Tendon Reflexes: 2+ and symmetric in the biceps and patellae.  Plantars: Toes are downgoing bilaterally.  Cerebellar: FNF and HKS are intact bilaterally  I have reviewed labs in epic and the results pertinent to this consultation are: Creatinine 1.41 (GFR 45), potassium mildly increased at 5.3, alk phos increased at 232 with normal T bili White blood cell count mild elevation at 10.9 (chronic),  thrombocytosis to 540  Glucose 340   I have personally reviewed the images obtained: MRI Brain w/o acute intracranial process 06/29/2020 MRI C-spine w/o moderate degenerative changes, spinal cord stenosis without clear cord signal change  Radiology impression C-spine: 1. No acute abnormality within the cervical spine. 2. Degenerative disc osteophyte at C5-6 and C6-7 with resultant moderate spinal stenosis, with moderate to severe bilateral C6 and C7 foraminal narrowing as above. 3. Right eccentric disc osteophyte at C4-5 with resultant mild canal and right worse than left C5 foraminal stenosis. 4. Right-sided uncovertebral hypertrophy at C7-T1 with resultant mild to moderate right C8 foraminal narrowing.  Impression: This is a 55 year old woman with a past medical history significant for diabetes, hypertension, hyperlipidemia presenting with facial droop and left-sided weakness/paresthesias.  Gradual  progression of symptoms as well as her examination are consistent with Bell's palsy of the left face  Suspect neuropathy and degenerative disc disease may be contributing to her mild weakness of the arm and leg as well as paresthesias.    Recommendations: -Valacyclovir 500 mg 3 times daily x7 days -Risks of prednisone are outweighed by the benefits given her diabetes, relatively mild symptoms and time from symptom onset -Eye ointment and eyedrops in case of worsening eyelid weakness to prevent corneal abrasions -Outpatient follow-up with neurology for consideration of EMG/nerve conduction study to clarify her left-sided paresthesias and mild weakness  Lesleigh Noe MD-PhD [facility] [phone] Available 7 PM to 7 AM, outside of these hours please call Neurologist on call as listed on Amion.

## 2020-07-01 NOTE — ED Notes (Signed)
Discharge instructions reviewed. Prescriptions and follow-up care reviewed. No questions or concerns made by pt. Pt was removed. Pt ambulatory with husband upon discharge.

## 2020-07-03 DIAGNOSIS — I10 Essential (primary) hypertension: Secondary | ICD-10-CM | POA: Diagnosis not present

## 2020-07-03 DIAGNOSIS — E10311 Type 1 diabetes mellitus with unspecified diabetic retinopathy with macular edema: Secondary | ICD-10-CM | POA: Diagnosis not present

## 2020-07-03 DIAGNOSIS — E782 Mixed hyperlipidemia: Secondary | ICD-10-CM | POA: Diagnosis not present

## 2020-07-03 DIAGNOSIS — E1140 Type 2 diabetes mellitus with diabetic neuropathy, unspecified: Secondary | ICD-10-CM | POA: Diagnosis not present

## 2020-07-03 DIAGNOSIS — Z79899 Other long term (current) drug therapy: Secondary | ICD-10-CM | POA: Diagnosis not present

## 2020-07-03 DIAGNOSIS — F339 Major depressive disorder, recurrent, unspecified: Secondary | ICD-10-CM | POA: Diagnosis not present

## 2020-07-03 DIAGNOSIS — W228XXA Striking against or struck by other objects, initial encounter: Secondary | ICD-10-CM | POA: Diagnosis not present

## 2020-07-03 DIAGNOSIS — Y999 Unspecified external cause status: Secondary | ICD-10-CM | POA: Diagnosis not present

## 2020-07-03 DIAGNOSIS — F988 Other specified behavioral and emotional disorders with onset usually occurring in childhood and adolescence: Secondary | ICD-10-CM | POA: Diagnosis not present

## 2020-07-03 DIAGNOSIS — G2581 Restless legs syndrome: Secondary | ICD-10-CM | POA: Diagnosis not present

## 2020-07-03 DIAGNOSIS — N1832 Chronic kidney disease, stage 3b: Secondary | ICD-10-CM | POA: Diagnosis not present

## 2020-07-03 DIAGNOSIS — E11319 Type 2 diabetes mellitus with unspecified diabetic retinopathy without macular edema: Secondary | ICD-10-CM | POA: Diagnosis not present

## 2020-07-03 DIAGNOSIS — H40053 Ocular hypertension, bilateral: Secondary | ICD-10-CM | POA: Diagnosis not present

## 2020-07-03 DIAGNOSIS — S0502XA Injury of conjunctiva and corneal abrasion without foreign body, left eye, initial encounter: Secondary | ICD-10-CM | POA: Diagnosis not present

## 2020-07-03 DIAGNOSIS — D693 Immune thrombocytopenic purpura: Secondary | ICD-10-CM | POA: Diagnosis not present

## 2020-07-03 DIAGNOSIS — Z961 Presence of intraocular lens: Secondary | ICD-10-CM | POA: Diagnosis not present

## 2020-07-03 DIAGNOSIS — F419 Anxiety disorder, unspecified: Secondary | ICD-10-CM | POA: Diagnosis not present

## 2020-07-06 DIAGNOSIS — S0502XD Injury of conjunctiva and corneal abrasion without foreign body, left eye, subsequent encounter: Secondary | ICD-10-CM | POA: Diagnosis not present

## 2020-07-06 DIAGNOSIS — H40053 Ocular hypertension, bilateral: Secondary | ICD-10-CM | POA: Diagnosis not present

## 2020-07-06 DIAGNOSIS — Z961 Presence of intraocular lens: Secondary | ICD-10-CM | POA: Diagnosis not present

## 2020-07-06 DIAGNOSIS — S0502XA Injury of conjunctiva and corneal abrasion without foreign body, left eye, initial encounter: Secondary | ICD-10-CM | POA: Diagnosis not present

## 2020-07-06 DIAGNOSIS — E113513 Type 2 diabetes mellitus with proliferative diabetic retinopathy with macular edema, bilateral: Secondary | ICD-10-CM | POA: Diagnosis not present

## 2020-07-06 DIAGNOSIS — Z794 Long term (current) use of insulin: Secondary | ICD-10-CM | POA: Diagnosis not present

## 2020-07-06 DIAGNOSIS — Z7984 Long term (current) use of oral hypoglycemic drugs: Secondary | ICD-10-CM | POA: Diagnosis not present

## 2020-07-24 DIAGNOSIS — Z79899 Other long term (current) drug therapy: Secondary | ICD-10-CM | POA: Diagnosis not present

## 2020-07-24 DIAGNOSIS — R229 Localized swelling, mass and lump, unspecified: Secondary | ICD-10-CM | POA: Diagnosis not present

## 2020-07-24 DIAGNOSIS — L0102 Bockhart's impetigo: Secondary | ICD-10-CM | POA: Diagnosis not present

## 2020-07-24 DIAGNOSIS — Z6833 Body mass index (BMI) 33.0-33.9, adult: Secondary | ICD-10-CM | POA: Diagnosis not present

## 2020-07-24 DIAGNOSIS — L739 Follicular disorder, unspecified: Secondary | ICD-10-CM | POA: Diagnosis not present

## 2020-09-29 DIAGNOSIS — Z20822 Contact with and (suspected) exposure to covid-19: Secondary | ICD-10-CM | POA: Diagnosis not present

## 2020-10-30 DIAGNOSIS — Z79899 Other long term (current) drug therapy: Secondary | ICD-10-CM | POA: Diagnosis not present

## 2020-10-30 DIAGNOSIS — F339 Major depressive disorder, recurrent, unspecified: Secondary | ICD-10-CM | POA: Diagnosis not present

## 2020-10-30 DIAGNOSIS — I10 Essential (primary) hypertension: Secondary | ICD-10-CM | POA: Diagnosis not present

## 2020-10-30 DIAGNOSIS — E782 Mixed hyperlipidemia: Secondary | ICD-10-CM | POA: Diagnosis not present

## 2020-10-30 DIAGNOSIS — G2581 Restless legs syndrome: Secondary | ICD-10-CM | POA: Diagnosis not present

## 2020-10-30 DIAGNOSIS — F419 Anxiety disorder, unspecified: Secondary | ICD-10-CM | POA: Diagnosis not present

## 2020-10-30 DIAGNOSIS — E039 Hypothyroidism, unspecified: Secondary | ICD-10-CM | POA: Diagnosis not present

## 2020-10-30 DIAGNOSIS — D693 Immune thrombocytopenic purpura: Secondary | ICD-10-CM | POA: Diagnosis not present

## 2020-10-30 DIAGNOSIS — E11319 Type 2 diabetes mellitus with unspecified diabetic retinopathy without macular edema: Secondary | ICD-10-CM | POA: Diagnosis not present

## 2020-10-30 DIAGNOSIS — E109 Type 1 diabetes mellitus without complications: Secondary | ICD-10-CM | POA: Diagnosis not present

## 2020-10-30 DIAGNOSIS — N1832 Chronic kidney disease, stage 3b: Secondary | ICD-10-CM | POA: Diagnosis not present

## 2020-10-30 DIAGNOSIS — E1140 Type 2 diabetes mellitus with diabetic neuropathy, unspecified: Secondary | ICD-10-CM | POA: Diagnosis not present

## 2020-10-30 DIAGNOSIS — F988 Other specified behavioral and emotional disorders with onset usually occurring in childhood and adolescence: Secondary | ICD-10-CM | POA: Diagnosis not present

## 2020-11-30 DIAGNOSIS — Z20822 Contact with and (suspected) exposure to covid-19: Secondary | ICD-10-CM | POA: Diagnosis not present

## 2020-12-25 DIAGNOSIS — Z6830 Body mass index (BMI) 30.0-30.9, adult: Secondary | ICD-10-CM | POA: Diagnosis not present

## 2020-12-25 DIAGNOSIS — F339 Major depressive disorder, recurrent, unspecified: Secondary | ICD-10-CM | POA: Diagnosis not present

## 2020-12-25 DIAGNOSIS — E109 Type 1 diabetes mellitus without complications: Secondary | ICD-10-CM | POA: Diagnosis not present

## 2021-01-06 DIAGNOSIS — E1043 Type 1 diabetes mellitus with diabetic autonomic (poly)neuropathy: Secondary | ICD-10-CM | POA: Diagnosis not present

## 2021-01-06 DIAGNOSIS — K3184 Gastroparesis: Secondary | ICD-10-CM | POA: Diagnosis not present

## 2021-02-03 DIAGNOSIS — Z23 Encounter for immunization: Secondary | ICD-10-CM | POA: Diagnosis not present

## 2021-02-03 DIAGNOSIS — E1143 Type 2 diabetes mellitus with diabetic autonomic (poly)neuropathy: Secondary | ICD-10-CM | POA: Diagnosis not present

## 2021-02-03 DIAGNOSIS — F988 Other specified behavioral and emotional disorders with onset usually occurring in childhood and adolescence: Secondary | ICD-10-CM | POA: Diagnosis not present

## 2021-02-03 DIAGNOSIS — E109 Type 1 diabetes mellitus without complications: Secondary | ICD-10-CM | POA: Diagnosis not present

## 2021-02-03 DIAGNOSIS — E782 Mixed hyperlipidemia: Secondary | ICD-10-CM | POA: Diagnosis not present

## 2021-02-03 DIAGNOSIS — F339 Major depressive disorder, recurrent, unspecified: Secondary | ICD-10-CM | POA: Diagnosis not present

## 2021-02-03 DIAGNOSIS — N1832 Chronic kidney disease, stage 3b: Secondary | ICD-10-CM | POA: Diagnosis not present

## 2021-02-03 DIAGNOSIS — N1831 Chronic kidney disease, stage 3a: Secondary | ICD-10-CM | POA: Diagnosis not present

## 2021-02-03 DIAGNOSIS — D75839 Thrombocytosis, unspecified: Secondary | ICD-10-CM | POA: Diagnosis not present

## 2021-02-03 DIAGNOSIS — Z6830 Body mass index (BMI) 30.0-30.9, adult: Secondary | ICD-10-CM | POA: Diagnosis not present

## 2021-02-03 DIAGNOSIS — I10 Essential (primary) hypertension: Secondary | ICD-10-CM | POA: Diagnosis not present

## 2021-02-03 DIAGNOSIS — Z79899 Other long term (current) drug therapy: Secondary | ICD-10-CM | POA: Diagnosis not present

## 2021-02-03 DIAGNOSIS — K3184 Gastroparesis: Secondary | ICD-10-CM | POA: Diagnosis not present

## 2021-02-26 DIAGNOSIS — R5383 Other fatigue: Secondary | ICD-10-CM | POA: Diagnosis not present

## 2021-02-26 DIAGNOSIS — R519 Headache, unspecified: Secondary | ICD-10-CM | POA: Diagnosis not present

## 2021-02-26 DIAGNOSIS — J019 Acute sinusitis, unspecified: Secondary | ICD-10-CM | POA: Diagnosis not present

## 2021-02-26 DIAGNOSIS — R0981 Nasal congestion: Secondary | ICD-10-CM | POA: Diagnosis not present

## 2021-02-27 DIAGNOSIS — M791 Myalgia, unspecified site: Secondary | ICD-10-CM | POA: Diagnosis not present

## 2021-02-27 DIAGNOSIS — R112 Nausea with vomiting, unspecified: Secondary | ICD-10-CM | POA: Diagnosis not present

## 2021-02-27 DIAGNOSIS — E119 Type 2 diabetes mellitus without complications: Secondary | ICD-10-CM | POA: Diagnosis not present

## 2021-02-27 DIAGNOSIS — K591 Functional diarrhea: Secondary | ICD-10-CM | POA: Diagnosis not present

## 2021-02-27 DIAGNOSIS — Z20828 Contact with and (suspected) exposure to other viral communicable diseases: Secondary | ICD-10-CM | POA: Diagnosis not present

## 2021-03-31 ENCOUNTER — Other Ambulatory Visit: Payer: Self-pay | Admitting: Hematology and Oncology

## 2021-04-01 DIAGNOSIS — L308 Other specified dermatitis: Secondary | ICD-10-CM | POA: Diagnosis not present

## 2021-04-01 DIAGNOSIS — D485 Neoplasm of uncertain behavior of skin: Secondary | ICD-10-CM | POA: Diagnosis not present

## 2021-04-01 DIAGNOSIS — L281 Prurigo nodularis: Secondary | ICD-10-CM | POA: Diagnosis not present

## 2021-04-01 DIAGNOSIS — L0100 Impetigo, unspecified: Secondary | ICD-10-CM | POA: Diagnosis not present

## 2021-04-09 DIAGNOSIS — D869 Sarcoidosis, unspecified: Secondary | ICD-10-CM | POA: Diagnosis not present

## 2021-04-09 DIAGNOSIS — Z0389 Encounter for observation for other suspected diseases and conditions ruled out: Secondary | ICD-10-CM | POA: Diagnosis not present

## 2021-04-14 DIAGNOSIS — Z803 Family history of malignant neoplasm of breast: Secondary | ICD-10-CM | POA: Diagnosis not present

## 2021-04-14 DIAGNOSIS — R0781 Pleurodynia: Secondary | ICD-10-CM | POA: Diagnosis not present

## 2021-04-14 DIAGNOSIS — N644 Mastodynia: Secondary | ICD-10-CM | POA: Diagnosis not present

## 2021-04-14 DIAGNOSIS — D863 Sarcoidosis of skin: Secondary | ICD-10-CM | POA: Diagnosis not present

## 2021-04-14 DIAGNOSIS — N6321 Unspecified lump in the left breast, upper outer quadrant: Secondary | ICD-10-CM | POA: Diagnosis not present

## 2021-04-14 DIAGNOSIS — Z6828 Body mass index (BMI) 28.0-28.9, adult: Secondary | ICD-10-CM | POA: Diagnosis not present

## 2021-04-19 ENCOUNTER — Other Ambulatory Visit: Payer: Self-pay | Admitting: Family Medicine

## 2021-04-19 DIAGNOSIS — N6320 Unspecified lump in the left breast, unspecified quadrant: Secondary | ICD-10-CM

## 2021-04-19 DIAGNOSIS — N644 Mastodynia: Secondary | ICD-10-CM

## 2021-05-12 ENCOUNTER — Ambulatory Visit
Admission: RE | Admit: 2021-05-12 | Discharge: 2021-05-12 | Disposition: A | Discharge status: Home / Self Care | Payer: Medicare Other | Source: Ambulatory Visit | Attending: Family Medicine | Admitting: Family Medicine

## 2021-05-12 DIAGNOSIS — N644 Mastodynia: Secondary | ICD-10-CM

## 2021-05-12 DIAGNOSIS — R922 Inconclusive mammogram: Secondary | ICD-10-CM | POA: Diagnosis not present

## 2021-05-12 DIAGNOSIS — N6320 Unspecified lump in the left breast, unspecified quadrant: Secondary | ICD-10-CM

## 2021-05-12 IMAGING — MG DIGITAL DIAGNOSTIC BILAT W/ TOMO W/ CAD
5 of 10 series · 5 of 30 positions shown · non-contrast
Comparison: Previous exam(s).

CLINICAL DATA: 55-year-old female presenting with a new lump in the
left breast. Patient also reports intermittent bilateral diffuse
predominantly lateral breast pain. Patient has a family history of
breast cancer in the her mother and sister.

EXAM:
DIGITAL DIAGNOSTIC BILATERAL MAMMOGRAM WITH TOMOSYNTHESIS AND CAD;
ULTRASOUND LEFT BREAST LIMITED
TECHNIQUE: Bilateral digital diagnostic mammography and breast tomosynthesis
was performed. The images were evaluated with computer-aided
detection.; Targeted ultrasound examination of the left breast was
performed.

[R MLO synth-2D]
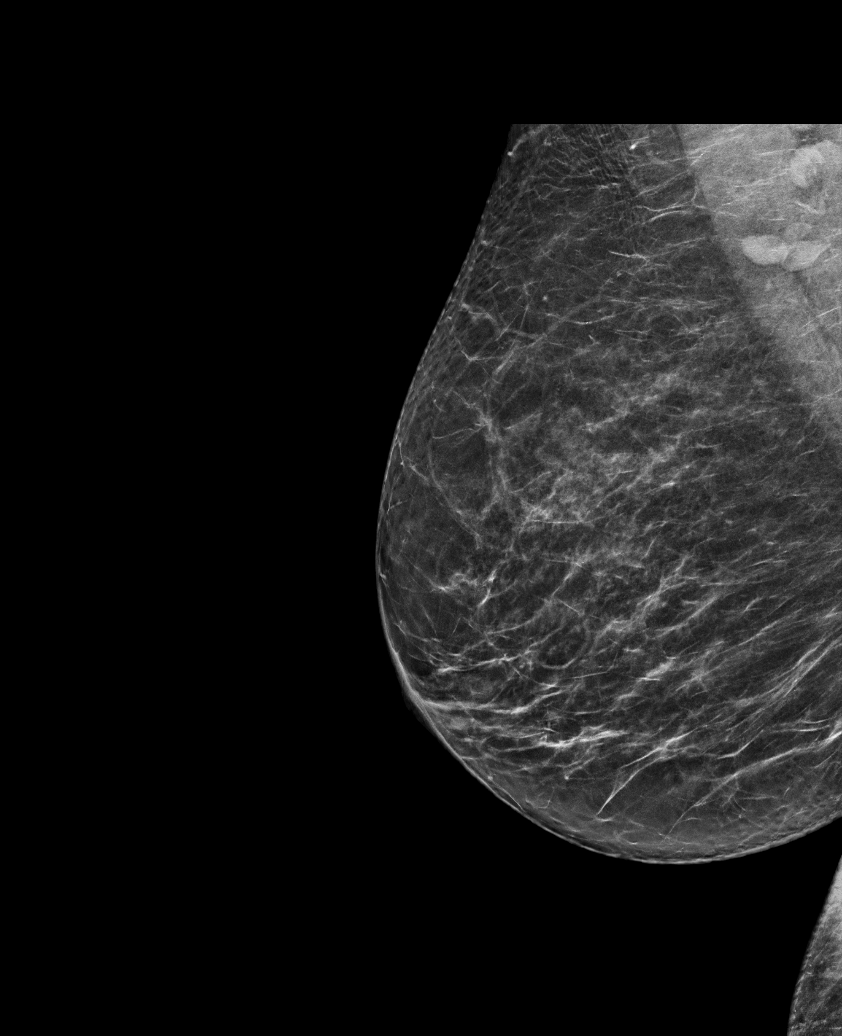

[L MLO synth-2D (1 of 2)]
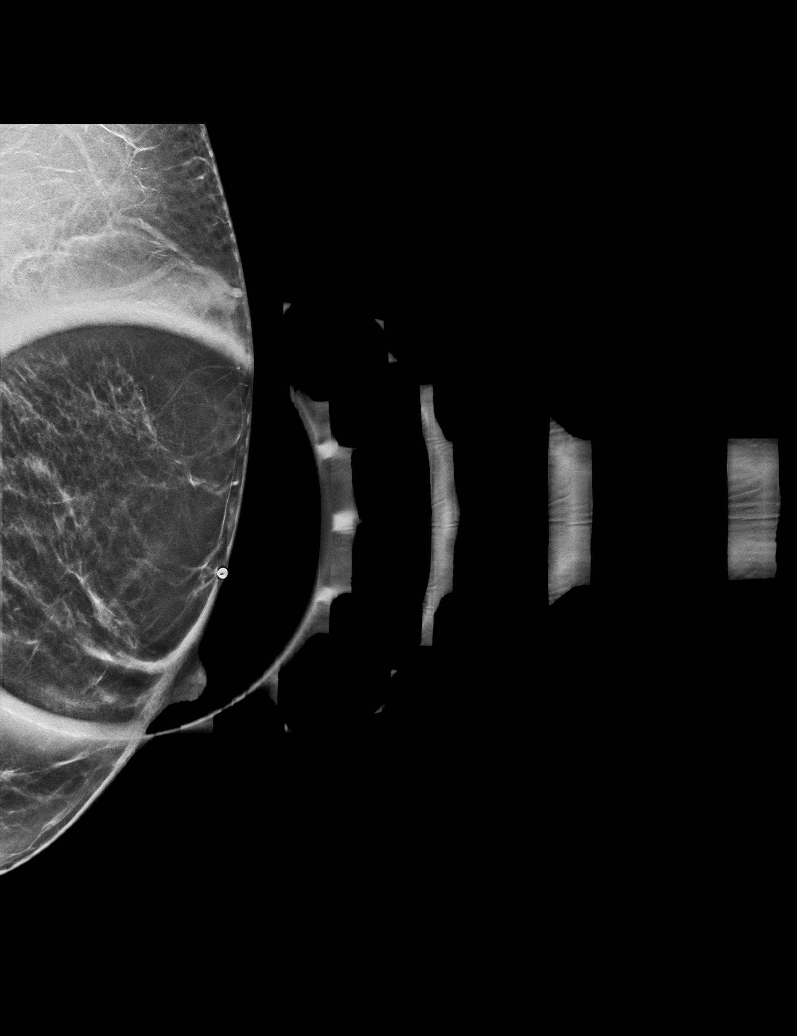

[L MLO synth-2D (2 of 2)]
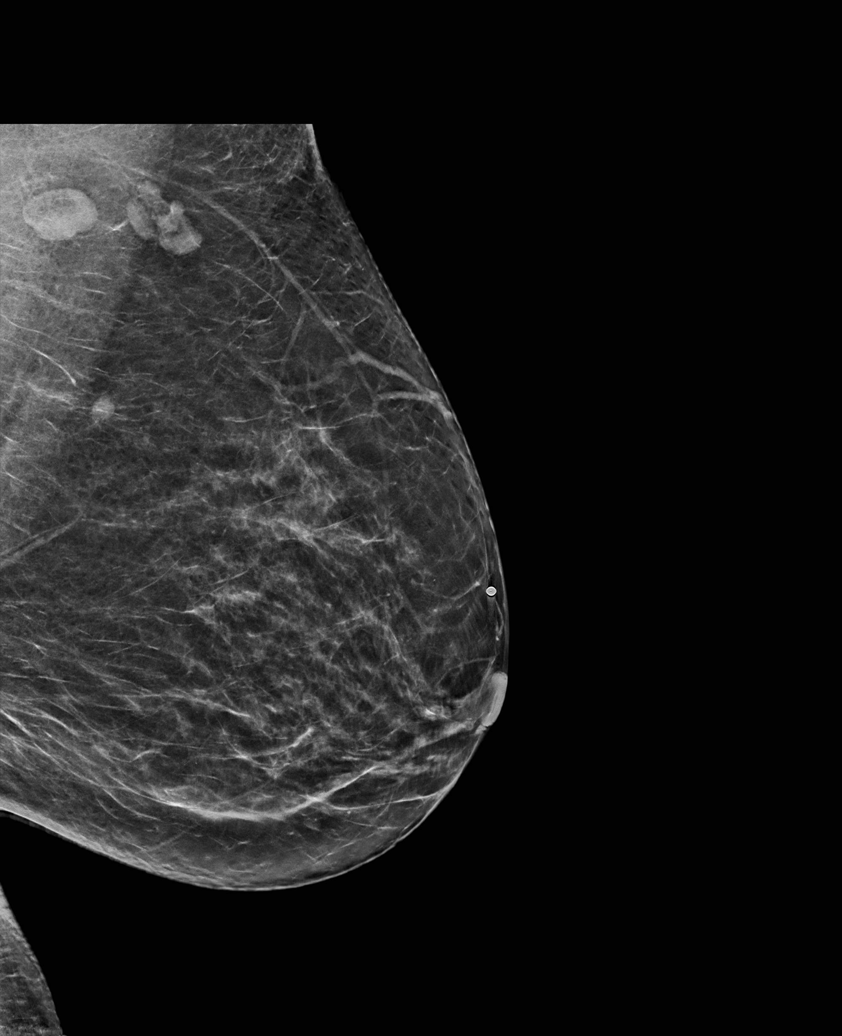

[R CC synth-2D]
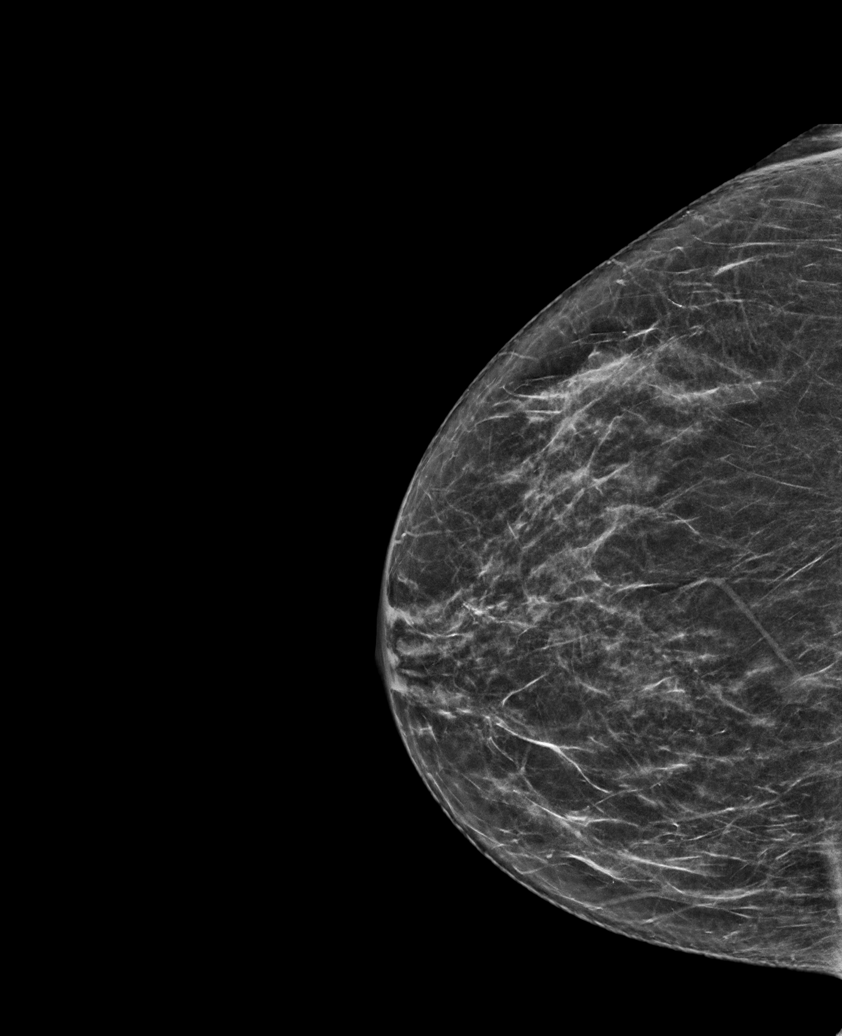

[L CC synth-2D]
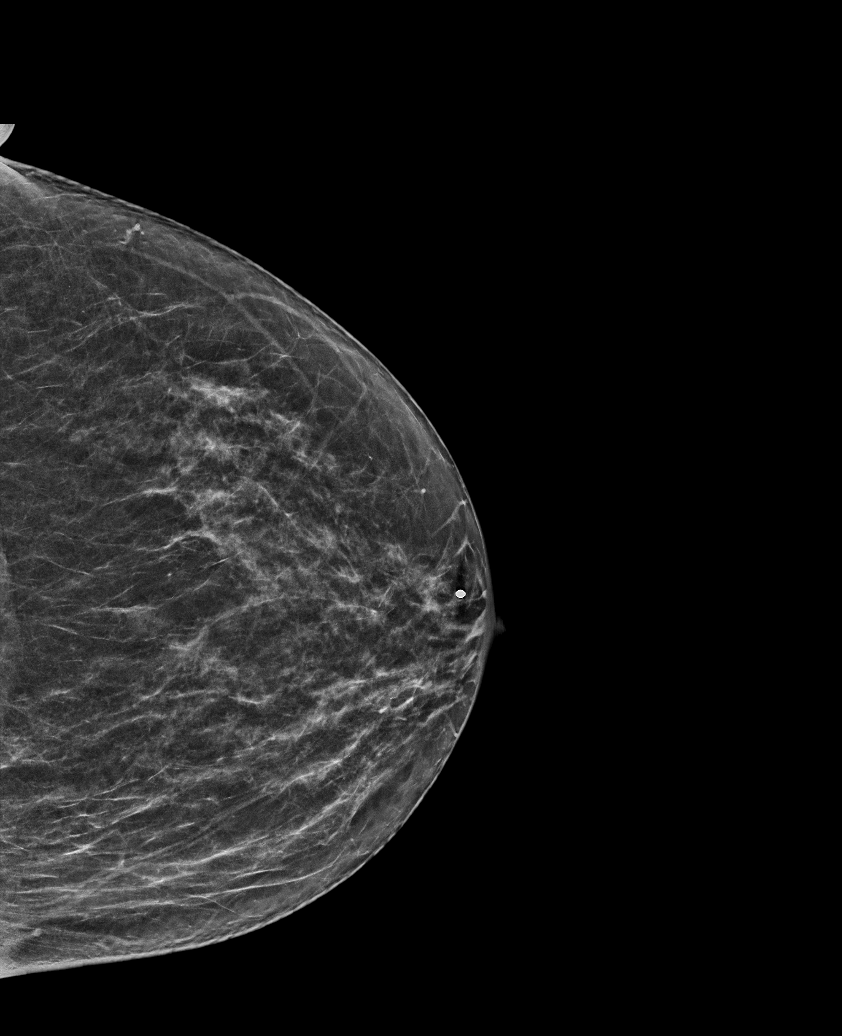

[5 of 30 positions shown; findings below may reference images not displayed]

ACR Breast Density Category b: There are scattered areas of
fibroglandular density.
FINDINGS: Mammogram:

Right breast: No suspicious mass, distortion, or microcalcifications
are identified to suggest presence of malignancy.

Left breast: A skin BB marks the palpable site of concern reported
by the patient in the slightly superior left breast. A spot
tangential view of this area was performed in addition to standard
views. There is no new abnormality at the palpable site or elsewhere
suggest the presence of malignancy.

On physical exam at the site of concern reported by the patient the
left breast I do not feel a discrete mass or focal area of
thickening.

Ultrasound:

Targeted ultrasound performed at the palpable site of concern in the
left breast at 12 o'clock retroareolar demonstrating no cystic or
solid mass.
IMPRESSION: 1. No mammographic or sonographic evidence of malignancy at the
palpable site of concern in the left breast.

2. No mammographic finding to explain the patient's diffuse
bilateral breast pain.

RECOMMENDATION:
1. Clinical follow-up as needed for the left breast pain. Breast
pain is a common condition, which will often resolve on its own
without intervention. It can be affected by hormonal changes,
medication side effect, weight changes and fit of the bra. Pain may
also be referred from other adjacent areas of the body. Breast pain
may be improved by wearing adequate well-fitting support,
over-the-counter topical and oral NSAID medication, low-fat diet,
and ice/heat as needed. Studies have shown an improvement in cyclic
pain with use of evening primrose oil and vitamin E.

2. Consider genetics assessment to determine the patient's lifetime
risk of breast cancer given her family history, if this has not
already been performed. Per American Cancer Society guidelines, if
the patient has a calculated lifetime risk of developing breast
cancer of greater than 20%, annual screening MRI of the breasts
would be recommended at the time of screening mammography.

3.  Screening mammogram in one year.(Code:[FK])

I have discussed the findings and recommendations with the patient.
If applicable, a reminder letter will be sent to the patient
regarding the next appointment.

BI-RADS CATEGORY  1: Negative.

## 2021-05-12 IMAGING — US US BREAST*L* LIMITED INC AXILLA
1 series · 2 of 2 positions shown · non-contrast
Comparison: Previous exam(s).

CLINICAL DATA: 55-year-old female presenting with a new lump in the
left breast. Patient also reports intermittent bilateral diffuse
predominantly lateral breast pain. Patient has a family history of
breast cancer in the her mother and sister.

EXAM:
DIGITAL DIAGNOSTIC BILATERAL MAMMOGRAM WITH TOMOSYNTHESIS AND CAD;
ULTRASOUND LEFT BREAST LIMITED
TECHNIQUE: Bilateral digital diagnostic mammography and breast tomosynthesis
was performed. The images were evaluated with computer-aided
detection.; Targeted ultrasound examination of the left breast was
performed.

[Series 1: us breast*left* limited inc axilla · 0.07mm/px · 2 of 2 slices shown]
[im 1/2]
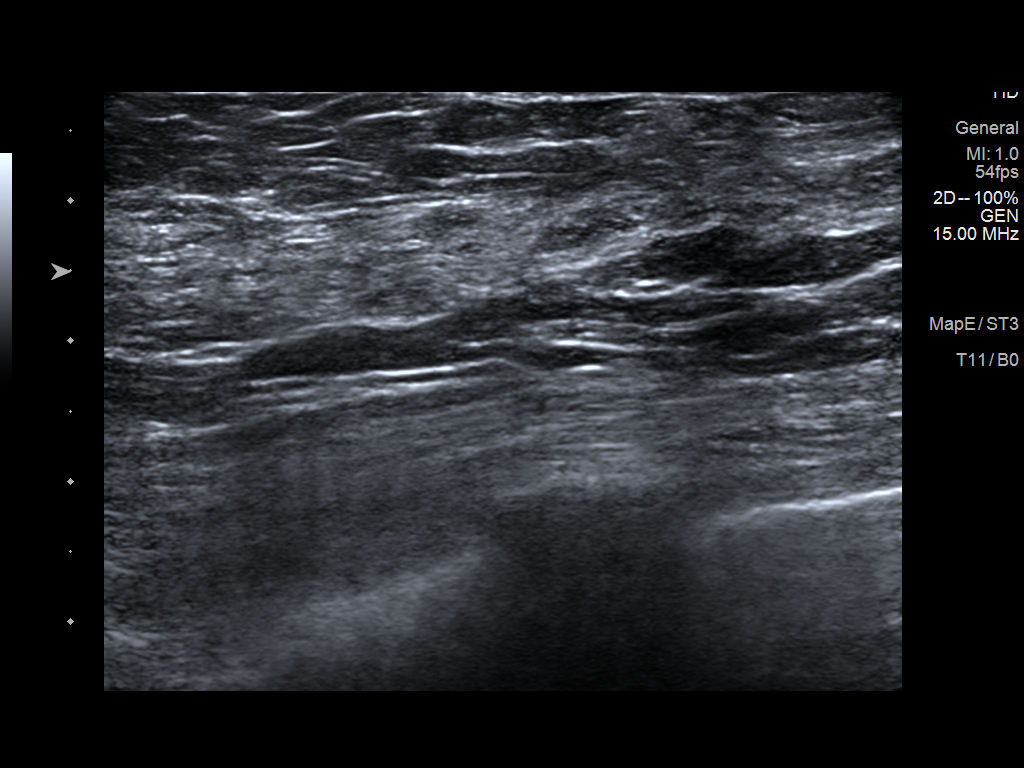
[im 2/2]
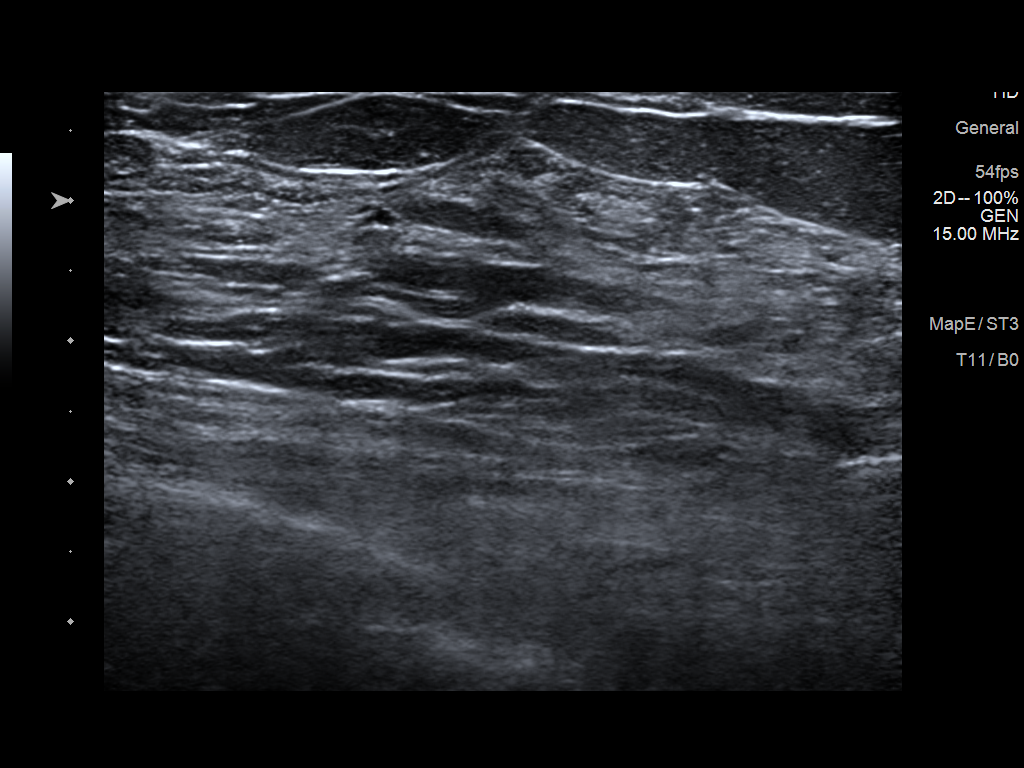

[2 of 2 positions shown; findings below may reference images not displayed]

ACR Breast Density Category b: There are scattered areas of
fibroglandular density.
FINDINGS: Mammogram:

Right breast: No suspicious mass, distortion, or microcalcifications
are identified to suggest presence of malignancy.

Left breast: A skin BB marks the palpable site of concern reported
by the patient in the slightly superior left breast. A spot
tangential view of this area was performed in addition to standard
views. There is no new abnormality at the palpable site or elsewhere
suggest the presence of malignancy.

On physical exam at the site of concern reported by the patient the
left breast I do not feel a discrete mass or focal area of
thickening.

Ultrasound:

Targeted ultrasound performed at the palpable site of concern in the
left breast at 12 o'clock retroareolar demonstrating no cystic or
solid mass.
IMPRESSION: 1. No mammographic or sonographic evidence of malignancy at the
palpable site of concern in the left breast.

2. No mammographic finding to explain the patient's diffuse
bilateral breast pain.

RECOMMENDATION:
1. Clinical follow-up as needed for the left breast pain. Breast
pain is a common condition, which will often resolve on its own
without intervention. It can be affected by hormonal changes,
medication side effect, weight changes and fit of the bra. Pain may
also be referred from other adjacent areas of the body. Breast pain
may be improved by wearing adequate well-fitting support,
over-the-counter topical and oral NSAID medication, low-fat diet,
and ice/heat as needed. Studies have shown an improvement in cyclic
pain with use of evening primrose oil and vitamin E.

2. Consider genetics assessment to determine the patient's lifetime
risk of breast cancer given her family history, if this has not
already been performed. Per American Cancer Society guidelines, if
the patient has a calculated lifetime risk of developing breast
cancer of greater than 20%, annual screening MRI of the breasts
would be recommended at the time of screening mammography.

3.  Screening mammogram in one year.(Code:[FK])

I have discussed the findings and recommendations with the patient.
If applicable, a reminder letter will be sent to the patient
regarding the next appointment.

BI-RADS CATEGORY  1: Negative.

## 2021-06-02 DIAGNOSIS — F339 Major depressive disorder, recurrent, unspecified: Secondary | ICD-10-CM | POA: Diagnosis not present

## 2021-06-02 DIAGNOSIS — E109 Type 1 diabetes mellitus without complications: Secondary | ICD-10-CM | POA: Diagnosis not present

## 2021-06-02 DIAGNOSIS — Z6828 Body mass index (BMI) 28.0-28.9, adult: Secondary | ICD-10-CM | POA: Diagnosis not present

## 2021-06-02 DIAGNOSIS — E782 Mixed hyperlipidemia: Secondary | ICD-10-CM | POA: Diagnosis not present

## 2021-06-02 DIAGNOSIS — N1831 Chronic kidney disease, stage 3a: Secondary | ICD-10-CM | POA: Diagnosis not present

## 2021-06-02 DIAGNOSIS — D75839 Thrombocytosis, unspecified: Secondary | ICD-10-CM | POA: Diagnosis not present

## 2021-06-02 DIAGNOSIS — F988 Other specified behavioral and emotional disorders with onset usually occurring in childhood and adolescence: Secondary | ICD-10-CM | POA: Diagnosis not present

## 2021-06-02 DIAGNOSIS — I10 Essential (primary) hypertension: Secondary | ICD-10-CM | POA: Diagnosis not present

## 2021-06-02 DIAGNOSIS — Z79899 Other long term (current) drug therapy: Secondary | ICD-10-CM | POA: Diagnosis not present

## 2021-06-02 DIAGNOSIS — F419 Anxiety disorder, unspecified: Secondary | ICD-10-CM | POA: Diagnosis not present

## 2021-06-07 ENCOUNTER — Telehealth: Payer: Self-pay | Admitting: Hematology and Oncology

## 2021-06-07 DIAGNOSIS — Z20828 Contact with and (suspected) exposure to other viral communicable diseases: Secondary | ICD-10-CM | POA: Diagnosis not present

## 2021-06-07 DIAGNOSIS — Z1152 Encounter for screening for COVID-19: Secondary | ICD-10-CM | POA: Diagnosis not present

## 2021-06-07 NOTE — Telephone Encounter (Signed)
Scheduled appt per 3/13 referral. Pt is aware of appt date and time. Pt is aware to arrive 15 mins prior to appt time and to bring and updated insurance card. Pt is aware of appt location.   ?

## 2021-06-08 DIAGNOSIS — Z20822 Contact with and (suspected) exposure to covid-19: Secondary | ICD-10-CM | POA: Diagnosis not present

## 2021-06-14 DIAGNOSIS — Z20822 Contact with and (suspected) exposure to covid-19: Secondary | ICD-10-CM | POA: Diagnosis not present

## 2021-06-23 NOTE — Progress Notes (Signed)
[place] ?CONSULT NOTE ? ?Patient Care Team: ?Philmore Pali, NP as PCP - General (Nurse Practitioner) ?Philmore Pali, NP (Nurse Practitioner) ? ?CHIEF COMPLAINTS/PURPOSE OF CONSULTATION:  Thrombocytosis and leukocytosis ? ?HISTORY OF PRESENTING ILLNESS:  ?Jill [name] 56 y.o. female is here because of recent diagnosis of Thrombocytosis and leukocytosis. She presents to the clinic today for consult and labs.  She was recently diagnosed with sarcoidosis with several subcutaneous nodules. ?Previously she had a work-up for thrombocytosis which included a bone marrow biopsy and a JAK2 mutation testing by Dr. Bobby Rumpf.  Both of which were negative and she was followed routinely.  Lately there was a dramatic increase in the white blood cell count up to 19,000 which is related to elevation of the neutrophils.  She has not had any fevers or chills or infections lately. ? ?I reviewed her records extensively and collaborated the history with the patient. ? ?MEDICAL HISTORY:  ?Past Medical History:  ?Diagnosis Date  ? ADHD   ? Arthralgia   ? Depression   ? Diabetes mellitus without complication ([place])   ? Diabetic retinopathy ([place])   ? Glaucoma   ? Hyperlipidemia   ? Hypertension   ? Macular edema   ? Visual impairment   ? ? ?SURGICAL HISTORY: ?Past Surgical History:  ?Procedure Laterality Date  ? CATARACT EXTRACTION    ? left eye  ? CESAREAN SECTION    ? left foot surgery    ? ? ?SOCIAL HISTORY: ?Social History  ? ?Socioeconomic History  ? Marital status: Married  ?  Spouse name: Not on file  ? Number of children: Not on file  ? Years of education: Not on file  ? Highest education level: Not on file  ?Occupational History  ? Not on file  ?Tobacco Use  ? Smoking status: Never  ? Smokeless tobacco: Never  ?Substance and Sexual Activity  ? Alcohol use: Not Currently  ? Drug use: Not Currently  ? Sexual activity: Not on file  ?Other Topics Concern  ? Not on file  ?Social History Narrative  ? Not on file   ? ?Social Determinants of Health  ? ?Financial Resource Strain: Not on file  ?Food Insecurity: Not on file  ?Transportation Needs: Not on file  ?Physical Activity: Not on file  ?Stress: Not on file  ?Social Connections: Not on file  ?Intimate Partner Violence: Not on file  ? ? ?FAMILY HISTORY: ?Family History  ?Problem Relation Age of Onset  ? Stroke Father   ? Stroke Paternal Grandfather   ? ? ?ALLERGIES:  is allergic to pb-hyoscy-atropine-scopolamine, canagliflozin, latex, phenobarbital-belladonna alk, and sulfamethoxazole. ? ?MEDICATIONS:  ?Current Outpatient Medications  ?Medication Sig Dispense Refill  ? metoCLOPramide (REGLAN) 10 MG tablet Take 1 tablet (10 mg total) by mouth daily.    ? Oxcarbazepine (TRILEPTAL) 300 MG tablet Take 1 tablet (300 mg total) by mouth daily.    ? acetaminophen (TYLENOL) 325 MG tablet Take 650 mg by mouth every 6 (six) hours as needed for mild pain or fever.     ? albuterol (PROVENTIL HFA;VENTOLIN HFA) 108 (90 Base) MCG/ACT inhaler Inhale 1-2 puffs into the lungs every 6 (six) hours as needed for wheezing or shortness of breath.     ? amphetamine-dextroamphetamine (ADDERALL) 20 MG tablet Take 20 mg by mouth 2 (two) times daily.     ? aspirin EC 81 MG tablet Take 81 mg by mouth daily.     ? buPROPion (WELLBUTRIN SR) 150 MG  12 hr tablet Take 150 mg by mouth 2 (two) times daily.  1  ? dorzolamide-timolol (COSOPT) 22.3-6.8 MG/ML ophthalmic solution Place 1 drop into both eyes 2 (two) times daily.     ? DULoxetine (CYMBALTA) 60 MG capsule Take 60 mg by mouth 2 (two) times daily.   1  ? fexofenadine (ALLEGRA) 180 MG tablet Take 180 mg by mouth daily.    ? hydrOXYzine (ATARAX/VISTARIL) 25 MG tablet Take 25 mg by mouth 3 (three) times daily.     ? insulin NPH Human (HUMULIN N,NOVOLIN N) 100 UNIT/ML injection Inject 10 Units into the skin 3 (three) times daily before meals.    ? latanoprost (XALATAN) 0.005 % ophthalmic solution Place 1 drop into both eyes at bedtime.   11  ?  lisinopril-hydrochlorothiazide (PRINZIDE,ZESTORETIC) 20-12.5 MG tablet Take 1 tablet by mouth daily.    ? lovastatin (MEVACOR) 20 MG tablet Take 20 mg by mouth at bedtime.     ? metFORMIN (GLUCOPHAGE-XR) 500 MG 24 hr tablet Take 1,000 mg by mouth 2 (two) times daily.     ? polyvinyl alcohol (LIQUIFILM TEARS) 1.4 % ophthalmic solution Place 1 drop into both eyes 2 (two) times daily as needed for dry eyes.     ? rOPINIRole (REQUIP) 0.5 MG tablet Take 0.5 mg by mouth at bedtime.    ? valACYclovir (VALTREX) 500 MG tablet Take 1 tablet (500 mg total) by mouth 3 (three) times daily. 30 tablet 0  ? ?No current facility-administered medications for this visit.  ? ? ?REVIEW OF SYSTEMS:   ?Constitutional: Denies fevers, chills or abnormal night sweats ?Subcutaneous nodules related to sarcoidosis ?All other systems were reviewed with the patient and are negative. ? ?PHYSICAL EXAMINATION: ?ECOG PERFORMANCE STATUS: 1 - Symptomatic but completely ambulatory ? ?Vitals:  ? 06/24/21 1053  ?BP: (!) 169/68  ?Pulse: (!) 109  ?Resp: 18  ?Temp: (!) 97.2 ?F (36.2 ?C)  ?SpO2: 100%  ? ?Filed Weights  ? 06/24/21 1053  ?Weight: 156 lb 9.6 oz (71 kg)  ? ?  ? ?LABORATORY DATA:  ?I have reviewed the data as listed ?Lab Results  ?Component Value Date  ? WBC 10.9 (H) 06/29/2020  ? HGB 11.9 (L) 06/29/2020  ? HCT 35.0 (L) 06/29/2020  ? MCV 91.0 06/29/2020  ? PLT 540 (H) 06/29/2020  ? ?Lab Results  ?Component Value Date  ? NA 136 06/29/2020  ? K 5.3 (H) 06/29/2020  ? CL 101 06/29/2020  ? CO2 26 06/29/2020  ? ? ?RADIOGRAPHIC STUDIES: ?I have personally reviewed the radiological reports and agreed with the findings in the report. ? ?ASSESSMENT AND PLAN:  ?Thrombocytosis ?Lab review  ?02/03/2021: Platelets 588, hemoglobin 12.4: WBC 11.2 ?06/29/2020: Platelets 540, hemoglobin 11.3, white count 10.9 ?10/30/2020: Platelets 508, hemoglobin 11, WBC 8.6 ?06/02/2021: WBC 19, platelets 582, ANC 14.4 ? ?Saw Dr. Bobby Rumpf at [facility] in July 2019 who  recommended aspirin, JAK2 mutation was ordered ?I discussed with the patient that it is most like related to underlying inflammation as result of sarcoidosis. ?We will do flow cytometry, MPN panel which is more extensive than the JAK2 test that she had previously and evaluate her peripheral smear.  We will also check CBC today. ? ?We will call her back in 3 weeks for discussion of the labs with a telephone visit. ?  ?All questions were answered. The patient knows to call the clinic with any problems, questions or concerns. ?  ? Harriette Ohara, MD ?06/24/21 ? I  Deritra Lannie Fields, am acting as a scribe for Dr. Lindi Adie  ? ?I Gardiner Coins am scribing for Dr. Lindi Adie ? ?I have reviewed the above documentation for accuracy and completeness, and I agree with the above. ?  ?

## 2021-06-24 ENCOUNTER — Inpatient Hospital Stay: Payer: Medicare Other

## 2021-06-24 ENCOUNTER — Inpatient Hospital Stay: Payer: Medicare Other | Attending: Hematology and Oncology | Admitting: Hematology and Oncology

## 2021-06-24 ENCOUNTER — Other Ambulatory Visit: Payer: Self-pay

## 2021-06-24 DIAGNOSIS — D869 Sarcoidosis, unspecified: Secondary | ICD-10-CM | POA: Insufficient documentation

## 2021-06-24 DIAGNOSIS — D75839 Thrombocytosis, unspecified: Secondary | ICD-10-CM | POA: Insufficient documentation

## 2021-06-24 DIAGNOSIS — D75838 Other thrombocytosis: Secondary | ICD-10-CM | POA: Diagnosis not present

## 2021-06-24 DIAGNOSIS — E785 Hyperlipidemia, unspecified: Secondary | ICD-10-CM | POA: Diagnosis not present

## 2021-06-24 DIAGNOSIS — Z7982 Long term (current) use of aspirin: Secondary | ICD-10-CM | POA: Diagnosis not present

## 2021-06-24 DIAGNOSIS — E11319 Type 2 diabetes mellitus with unspecified diabetic retinopathy without macular edema: Secondary | ICD-10-CM | POA: Insufficient documentation

## 2021-06-24 DIAGNOSIS — H409 Unspecified glaucoma: Secondary | ICD-10-CM | POA: Insufficient documentation

## 2021-06-24 DIAGNOSIS — Z794 Long term (current) use of insulin: Secondary | ICD-10-CM | POA: Diagnosis not present

## 2021-06-24 DIAGNOSIS — F909 Attention-deficit hyperactivity disorder, unspecified type: Secondary | ICD-10-CM | POA: Insufficient documentation

## 2021-06-24 DIAGNOSIS — I10 Essential (primary) hypertension: Secondary | ICD-10-CM | POA: Insufficient documentation

## 2021-06-24 DIAGNOSIS — F32A Depression, unspecified: Secondary | ICD-10-CM | POA: Diagnosis not present

## 2021-06-24 LAB — CBC WITH DIFFERENTIAL (CANCER CENTER ONLY)
Abs Immature Granulocytes: 0.05 10*3/uL (ref 0.00–0.07)
Basophils Absolute: 0.1 10*3/uL (ref 0.0–0.1)
Basophils Relative: 0 %
Eosinophils Absolute: 0.4 10*3/uL (ref 0.0–0.5)
Eosinophils Relative: 4 %
HCT: 36.7 % (ref 36.0–46.0)
Hemoglobin: 12.1 g/dL (ref 12.0–15.0)
Immature Granulocytes: 0 %
Lymphocytes Relative: 23 %
Lymphs Abs: 2.6 10*3/uL (ref 0.7–4.0)
MCH: 28.3 pg (ref 26.0–34.0)
MCHC: 33 g/dL (ref 30.0–36.0)
MCV: 85.9 fL (ref 80.0–100.0)
Monocytes Absolute: 0.7 10*3/uL (ref 0.1–1.0)
Monocytes Relative: 6 %
Neutro Abs: 7.4 10*3/uL (ref 1.7–7.7)
Neutrophils Relative %: 67 %
Platelet Count: 539 10*3/uL — ABNORMAL HIGH (ref 150–400)
RBC: 4.27 MIL/uL (ref 3.87–5.11)
RDW: 12.7 % (ref 11.5–15.5)
WBC Count: 11.2 10*3/uL — ABNORMAL HIGH (ref 4.0–10.5)
nRBC: 0 % (ref 0.0–0.2)

## 2021-06-24 MED ORDER — OXCARBAZEPINE 300 MG PO TABS: 300.0000 mg | ORAL_TABLET | Freq: Every day | ORAL | Status: AC

## 2021-06-24 MED ORDER — METOCLOPRAMIDE HCL 10 MG PO TABS: 10.0000 mg | ORAL_TABLET | Freq: Every day | ORAL | Status: DC

## 2021-06-24 NOTE — Assessment & Plan Note (Signed)
Lab review  ?02/03/2021: Platelets 588, hemoglobin 12.4: WBC 11.2 ?06/29/2020: Platelets 540, hemoglobin 11.3, white count 10.9 ?10/30/2020: Platelets 508, hemoglobin 11, WBC 8.6 ? ?Patient has a diagnosis of myeloproliferative disorder involvement in 2011 but did not require any treatment ?Saw Dr. Bobby Rumpf at [facility] in July 2019 who recommended aspirin, JAK2 mutation was ordered ? ?Differential diagnosis ?1. Primary thrombocytosis: Related to myeloproliferative disorders of the bone marrow especially essential thrombocytosis and CML. I would like to send for BCR-ABL as well as JAK-2 dictation testings. Patient understands that JAK2 mutation is only present in 50% of essential thrombocytosis so the test is advantageous only if it is positive. If it is negative, it does not rule out. ?2. Secondary/reactive thrombocytosis ?Different causes including infections, inflammation, iron deficiency.  ?I would like to send out for C-reactive protein, iron studies with ferritin to complete the workup. ? ?Treatment options: ?1. If it is primary essential thrombocytosis, treatment would depend on platelet count level as well as history of thrombosis. ?A. For low risk patients, (platelet counts less than 1000 and no history of blood clots) the treatment would be with aspirin therapy ?B. for high risk patients(platelet counts greater than 1000/history of blood clot) the treatment would be platelet lowering therapy with aspirin ?2. Treatment of secondary thrombocytosis would be to treat underlying cause. There would not be any risk of thrombosis with secondary thrombocytosis. ? ?Return to clinic in 3 weeks to discuss the results of these tests.  ?

## 2021-06-24 NOTE — Progress Notes (Signed)
Faxed records request to [facility], Sharyn Lull, for JAK2 ?

## 2021-06-25 ENCOUNTER — Telehealth: Payer: Self-pay | Admitting: Hematology and Oncology

## 2021-06-25 LAB — SURGICAL PATHOLOGY

## 2021-06-25 NOTE — Telephone Encounter (Signed)
Scheduled appointment per 3/30 los. Patient is aware of the upcoming appointment. ?

## 2021-06-28 LAB — FLOW CYTOMETRY

## 2021-06-29 LAB — JAK2 (INCLUDING V617F AND EXON 12), MPL,& CALR W/RFL MPN PANEL (NGS)

## 2021-07-01 DIAGNOSIS — Z20822 Contact with and (suspected) exposure to covid-19: Secondary | ICD-10-CM | POA: Diagnosis not present

## 2021-07-02 NOTE — Progress Notes (Signed)
HEMATOLOGY-ONCOLOGY TELEPHONE VISIT PROGRESS NOTE ? ?I connected with '@PTNAME'$ @ on 07/16/21 at 10:15 AM EDT by telephone and verified that I am speaking with the correct person using two identifiers.  ?I discussed the limitations, risks, security and privacy concerns of performing an evaluation and management service by telephone and the availability of in person appointments.  ?I also discussed with the patient that there may be a patient responsible charge related to this service. The patient expressed understanding and agreed to proceed.  ? ?History of Present Illness: Jill [name] 56 y.o. female is here because of recent diagnosis of Thrombocytosis and leukocytosis. She presents to the clinic today  ?Via telephone follow-up. ?  ? ?REVIEW OF SYSTEMS:   ?Constitutional: Denies fevers, chills or abnormal weight loss ?All other systems were reviewed with the patient and are negative. ?Observations/Objective:  ? ?  ?Assessment Plan:  ?Thrombocytosis ?Lab review  ?02/03/2021: Platelets 588, hemoglobin 12.4: WBC 11.2 ?06/29/2020: Platelets 540, hemoglobin 11.3, white count 10.9 ?10/30/2020: Platelets 508, hemoglobin 11, WBC 8.6 ?06/02/2021: WBC 19, platelets 582, ANC 14.4  ?06/24/2021: WBC 11.2, hemoglobin 12.1, platelets 539, JAK2 and MPN panel: Negative, flow cytometry: No lymphocyte abnormal subsets ? ?I discussed with the patient that it is most like related to underlying inflammation as result of sarcoidosis. ?Return to clinic in 6 months for labs and follow-up ? ? ? ?I discussed the assessment and treatment plan with the patient. The patient was provided an opportunity to ask questions and all were answered. The patient agreed with the plan and demonstrated an understanding of the instructions. The patient was advised to call back or seek an in-person evaluation if the symptoms worsen or if the condition fails to improve as anticipated.  ? ?I provided 12 minutes of non-face-to-face time during this encounter.  Harriette Ohara, MD   ?Earlie Server am scribing for Dr. Lindi Adie ? ?I have reviewed the above documentation for accuracy and completeness, and I agree with the above. ?  ?

## 2021-07-16 ENCOUNTER — Inpatient Hospital Stay: Payer: Medicare Other | Attending: Hematology and Oncology | Admitting: Hematology and Oncology

## 2021-07-16 DIAGNOSIS — D75839 Thrombocytosis, unspecified: Secondary | ICD-10-CM

## 2021-07-16 NOTE — Assessment & Plan Note (Signed)
Lab review  ?02/03/2021: Platelets 588, hemoglobin 12.4: WBC 11.2 ?06/29/2020: Platelets 540, hemoglobin 11.3, white count 10.9 ?10/30/2020: Platelets 508, hemoglobin 11, WBC 8.6 ?06/02/2021: WBC 19, platelets 582, ANC 14.4  ?06/24/2021: WBC 11.2, hemoglobin 12.1, platelets 539, JAK2 and MPN panel: Negative, flow cytometry: No lymphocyte abnormal subsets ? ?I discussed with the patient that it is most like related to underlying inflammation as result of sarcoidosis. ?Return to clinic in 6 months for labs and follow-up ?

## 2021-07-20 ENCOUNTER — Telehealth: Payer: Self-pay | Admitting: Hematology and Oncology

## 2021-07-20 NOTE — Telephone Encounter (Signed)
Scheduled appointment per 4/21 los. Patient is aware. ?

## 2021-07-26 DIAGNOSIS — Z20822 Contact with and (suspected) exposure to covid-19: Secondary | ICD-10-CM | POA: Diagnosis not present

## 2021-08-10 DIAGNOSIS — Z794 Long term (current) use of insulin: Secondary | ICD-10-CM | POA: Diagnosis not present

## 2021-08-10 DIAGNOSIS — Z961 Presence of intraocular lens: Secondary | ICD-10-CM | POA: Diagnosis not present

## 2021-08-10 DIAGNOSIS — E113513 Type 2 diabetes mellitus with proliferative diabetic retinopathy with macular edema, bilateral: Secondary | ICD-10-CM | POA: Diagnosis not present

## 2021-08-10 DIAGNOSIS — H40043 Steroid responder, bilateral: Secondary | ICD-10-CM | POA: Diagnosis not present

## 2021-08-10 DIAGNOSIS — Z7984 Long term (current) use of oral hypoglycemic drugs: Secondary | ICD-10-CM | POA: Diagnosis not present

## 2021-08-10 DIAGNOSIS — H40053 Ocular hypertension, bilateral: Secondary | ICD-10-CM | POA: Diagnosis not present

## 2021-08-12 DIAGNOSIS — R112 Nausea with vomiting, unspecified: Secondary | ICD-10-CM | POA: Diagnosis not present

## 2021-08-12 DIAGNOSIS — R198 Other specified symptoms and signs involving the digestive system and abdomen: Secondary | ICD-10-CM | POA: Diagnosis not present

## 2021-08-13 DIAGNOSIS — H9319 Tinnitus, unspecified ear: Secondary | ICD-10-CM | POA: Diagnosis not present

## 2021-08-13 DIAGNOSIS — H6123 Impacted cerumen, bilateral: Secondary | ICD-10-CM | POA: Diagnosis not present

## 2021-09-14 DIAGNOSIS — H40053 Ocular hypertension, bilateral: Secondary | ICD-10-CM | POA: Diagnosis not present

## 2021-09-14 DIAGNOSIS — Z961 Presence of intraocular lens: Secondary | ICD-10-CM | POA: Diagnosis not present

## 2021-09-14 DIAGNOSIS — Z7984 Long term (current) use of oral hypoglycemic drugs: Secondary | ICD-10-CM | POA: Diagnosis not present

## 2021-09-14 DIAGNOSIS — H26492 Other secondary cataract, left eye: Secondary | ICD-10-CM | POA: Diagnosis not present

## 2021-09-14 DIAGNOSIS — E113513 Type 2 diabetes mellitus with proliferative diabetic retinopathy with macular edema, bilateral: Secondary | ICD-10-CM | POA: Diagnosis not present

## 2021-09-14 DIAGNOSIS — Z794 Long term (current) use of insulin: Secondary | ICD-10-CM | POA: Diagnosis not present

## 2021-10-04 DIAGNOSIS — D75839 Thrombocytosis, unspecified: Secondary | ICD-10-CM | POA: Diagnosis not present

## 2021-10-04 DIAGNOSIS — F988 Other specified behavioral and emotional disorders with onset usually occurring in childhood and adolescence: Secondary | ICD-10-CM | POA: Diagnosis not present

## 2021-10-04 DIAGNOSIS — E1040 Type 1 diabetes mellitus with diabetic neuropathy, unspecified: Secondary | ICD-10-CM | POA: Diagnosis not present

## 2021-10-04 DIAGNOSIS — E782 Mixed hyperlipidemia: Secondary | ICD-10-CM | POA: Diagnosis not present

## 2021-10-22 DIAGNOSIS — Z794 Long term (current) use of insulin: Secondary | ICD-10-CM | POA: Diagnosis not present

## 2021-10-22 DIAGNOSIS — H26492 Other secondary cataract, left eye: Secondary | ICD-10-CM | POA: Diagnosis not present

## 2021-10-22 DIAGNOSIS — Z79899 Other long term (current) drug therapy: Secondary | ICD-10-CM | POA: Diagnosis not present

## 2021-10-22 DIAGNOSIS — Z961 Presence of intraocular lens: Secondary | ICD-10-CM | POA: Diagnosis not present

## 2021-10-22 DIAGNOSIS — E113513 Type 2 diabetes mellitus with proliferative diabetic retinopathy with macular edema, bilateral: Secondary | ICD-10-CM | POA: Diagnosis not present

## 2021-10-22 DIAGNOSIS — I10 Essential (primary) hypertension: Secondary | ICD-10-CM | POA: Diagnosis not present

## 2021-10-22 DIAGNOSIS — E1136 Type 2 diabetes mellitus with diabetic cataract: Secondary | ICD-10-CM | POA: Diagnosis not present

## 2021-10-22 DIAGNOSIS — Z7984 Long term (current) use of oral hypoglycemic drugs: Secondary | ICD-10-CM | POA: Diagnosis not present

## 2021-10-22 DIAGNOSIS — H40053 Ocular hypertension, bilateral: Secondary | ICD-10-CM | POA: Diagnosis not present

## 2021-10-22 DIAGNOSIS — H3589 Other specified retinal disorders: Secondary | ICD-10-CM | POA: Diagnosis not present

## 2022-01-03 DIAGNOSIS — J019 Acute sinusitis, unspecified: Secondary | ICD-10-CM | POA: Diagnosis not present

## 2022-01-03 DIAGNOSIS — R059 Cough, unspecified: Secondary | ICD-10-CM | POA: Diagnosis not present

## 2022-01-03 DIAGNOSIS — R0981 Nasal congestion: Secondary | ICD-10-CM | POA: Diagnosis not present

## 2022-01-05 DIAGNOSIS — Z79899 Other long term (current) drug therapy: Secondary | ICD-10-CM | POA: Diagnosis not present

## 2022-01-05 DIAGNOSIS — D75839 Thrombocytosis, unspecified: Secondary | ICD-10-CM | POA: Diagnosis not present

## 2022-01-05 DIAGNOSIS — F988 Other specified behavioral and emotional disorders with onset usually occurring in childhood and adolescence: Secondary | ICD-10-CM | POA: Diagnosis not present

## 2022-01-05 DIAGNOSIS — I10 Essential (primary) hypertension: Secondary | ICD-10-CM | POA: Diagnosis not present

## 2022-01-05 DIAGNOSIS — F419 Anxiety disorder, unspecified: Secondary | ICD-10-CM | POA: Diagnosis not present

## 2022-01-05 DIAGNOSIS — N1831 Chronic kidney disease, stage 3a: Secondary | ICD-10-CM | POA: Diagnosis not present

## 2022-01-05 DIAGNOSIS — F339 Major depressive disorder, recurrent, unspecified: Secondary | ICD-10-CM | POA: Diagnosis not present

## 2022-01-05 DIAGNOSIS — E1040 Type 1 diabetes mellitus with diabetic neuropathy, unspecified: Secondary | ICD-10-CM | POA: Diagnosis not present

## 2022-01-05 DIAGNOSIS — R6889 Other general symptoms and signs: Secondary | ICD-10-CM | POA: Diagnosis not present

## 2022-01-05 DIAGNOSIS — E782 Mixed hyperlipidemia: Secondary | ICD-10-CM | POA: Diagnosis not present

## 2022-01-13 NOTE — Progress Notes (Incomplete)
Patient Care Team: Philmore Pali, NP (Inactive) as PCP - General (Nurse Practitioner) Philmore Pali, NP (Inactive) (Nurse Practitioner)  DIAGNOSIS: No diagnosis found.  SUMMARY OF ONCOLOGIC HISTORY: Oncology History   No history exists.    CHIEF COMPLIANT:   INTERVAL HISTORY: Donna Snooks is a   ALLERGIES:  is allergic to pb-hyoscy-atropine-scopolamine, canagliflozin, latex, phenobarbital-belladonna alk, and sulfamethoxazole.  MEDICATIONS:  Current Outpatient Medications  Medication Sig Dispense Refill   acetaminophen (TYLENOL) 325 MG tablet Take 650 mg by mouth every 6 (six) hours as needed for mild pain or fever.      albuterol (PROVENTIL HFA;VENTOLIN HFA) 108 (90 Base) MCG/ACT inhaler Inhale 1-2 puffs into the lungs every 6 (six) hours as needed for wheezing or shortness of breath.      amphetamine-dextroamphetamine (ADDERALL) 20 MG tablet Take 20 mg by mouth 2 (two) times daily.      aspirin EC 81 MG tablet Take 81 mg by mouth daily.      buPROPion (WELLBUTRIN SR) 150 MG 12 hr tablet Take 150 mg by mouth 2 (two) times daily.  1   dorzolamide-timolol (COSOPT) 22.3-6.8 MG/ML ophthalmic solution Place 1 drop into both eyes 2 (two) times daily.      DULoxetine (CYMBALTA) 60 MG capsule Take 60 mg by mouth 2 (two) times daily.   1   fexofenadine (ALLEGRA) 180 MG tablet Take 180 mg by mouth daily.     hydrOXYzine (ATARAX/VISTARIL) 25 MG tablet Take 25 mg by mouth 3 (three) times daily.      insulin NPH Human (HUMULIN N,NOVOLIN N) 100 UNIT/ML injection Inject 10 Units into the skin 3 (three) times daily before meals.     latanoprost (XALATAN) 0.005 % ophthalmic solution Place 1 drop into both eyes at bedtime.   11   lisinopril-hydrochlorothiazide (PRINZIDE,ZESTORETIC) 20-12.5 MG tablet Take 1 tablet by mouth daily.     lovastatin (MEVACOR) 20 MG tablet Take 20 mg by mouth at bedtime.      metFORMIN (GLUCOPHAGE-XR) 500 MG 24 hr tablet Take 1,000 mg by mouth 2 (two) times  daily.      metoCLOPramide (REGLAN) 10 MG tablet Take 1 tablet (10 mg total) by mouth daily.     Oxcarbazepine (TRILEPTAL) 300 MG tablet Take 1 tablet (300 mg total) by mouth daily.     polyvinyl alcohol (LIQUIFILM TEARS) 1.4 % ophthalmic solution Place 1 drop into both eyes 2 (two) times daily as needed for dry eyes.      rOPINIRole (REQUIP) 0.5 MG tablet Take 0.5 mg by mouth at bedtime.     valACYclovir (VALTREX) 500 MG tablet Take 1 tablet (500 mg total) by mouth 3 (three) times daily. 30 tablet 0   No current facility-administered medications for this visit.    PHYSICAL EXAMINATION: ECOG PERFORMANCE STATUS: {CHL ONC ECOG PS:[id number]}  There were no vitals filed for this visit. There were no vitals filed for this visit.  BREAST:*** No palpable masses or nodules in either right or left breasts. No palpable axillary supraclavicular or infraclavicular adenopathy no breast tenderness or nipple discharge. (exam performed in the presence of a chaperone)  LABORATORY DATA:  I have reviewed the data as listed    Latest Ref Rng & Units 06/29/2020    2:45 PM 06/29/2020    2:00 PM 06/28/2018    9:45 PM  CMP  Glucose 70 - 99 mg/dL 340  341  83   BUN 6 - 20 mg/dL 22  19  27  Creatinine 0.44 - 1.00 mg/dL 1.30  1.41  1.54   Sodium 135 - 145 mmol/L 136  135  134   Potassium 3.5 - 5.1 mmol/L 5.3  5.3  5.1   Chloride 98 - 111 mmol/L 101  99  100   CO2 22 - 32 mmol/L  26  21   Calcium 8.9 - 10.3 mg/dL  9.7  9.7   Total Protein 6.5 - 8.1 g/dL  7.0  7.7   Total Bilirubin 0.3 - 1.2 mg/dL  0.5  0.6   Alkaline Phos 38 - 126 U/L  252  206   AST 15 - 41 U/L  33  36   ALT 0 - 44 U/L  59  53     Lab Results  Component Value Date   WBC 11.2 (H) 06/24/2021   HGB 12.1 06/24/2021   HCT 36.7 06/24/2021   MCV 85.9 06/24/2021   PLT 539 (H) 06/24/2021   NEUTROABS 7.4 06/24/2021    ASSESSMENT & PLAN:  No problem-specific Assessment & Plan notes found for this encounter.    No orders of the defined  types were placed in this encounter.  The patient has a good understanding of the overall plan. she agrees with it. she will call with any problems that may develop before the next visit here. Total time spent: 30 mins including face to face time and time spent for planning, charting and co-ordination of care   Suzzette Righter, [place] 01/13/22    I Gardiner Coins am scribing for Dr. Lindi Adie  ***

## 2022-01-17 ENCOUNTER — Inpatient Hospital Stay: Payer: Medicare Other | Admitting: Hematology and Oncology

## 2022-01-17 NOTE — Assessment & Plan Note (Deleted)
Lab review  02/03/2021: Platelets 588, hemoglobin 12.4: WBC 11.2 06/29/2020: Platelets 540, hemoglobin 11.3, white count 10.9 10/30/2020: Platelets 508, hemoglobin 11, WBC 8.6 06/02/2021: WBC 19, platelets 582, ANC 14.4  06/24/2021: WBC 11.2, hemoglobin 12.1, platelets 539, JAK2 and MPN panel: Negative, flow cytometry: No lymphocyte abnormal subsets 01/17/2022:  I discussed with the patient that it is most like related to underlying inflammation as result of sarcoidosis. Return to clinic in 6 months for labs and follow-up

## 2022-01-18 DIAGNOSIS — E11649 Type 2 diabetes mellitus with hypoglycemia without coma: Secondary | ICD-10-CM | POA: Diagnosis not present

## 2022-01-18 DIAGNOSIS — I10 Essential (primary) hypertension: Secondary | ICD-10-CM | POA: Diagnosis not present

## 2022-01-18 DIAGNOSIS — E119 Type 2 diabetes mellitus without complications: Secondary | ICD-10-CM | POA: Diagnosis not present

## 2022-01-18 DIAGNOSIS — E162 Hypoglycemia, unspecified: Secondary | ICD-10-CM | POA: Diagnosis not present

## 2022-01-18 DIAGNOSIS — Z794 Long term (current) use of insulin: Secondary | ICD-10-CM | POA: Diagnosis not present

## 2022-01-18 DIAGNOSIS — E7800 Pure hypercholesterolemia, unspecified: Secondary | ICD-10-CM | POA: Diagnosis not present

## 2022-02-01 ENCOUNTER — Inpatient Hospital Stay: Payer: Medicare Other

## 2022-02-01 ENCOUNTER — Inpatient Hospital Stay: Payer: Medicare Other | Attending: Hematology and Oncology | Admitting: Hematology and Oncology

## 2022-02-01 ENCOUNTER — Other Ambulatory Visit: Payer: Self-pay

## 2022-02-01 ENCOUNTER — Ambulatory Visit: Payer: Medicare Other

## 2022-02-01 ENCOUNTER — Other Ambulatory Visit: Payer: Self-pay | Admitting: *Deleted

## 2022-02-01 VITALS — BP 170/69 | HR 96 | Temp 97.5°F | Resp 18 | Ht 61.0 in | Wt 148.3 lb

## 2022-02-01 DIAGNOSIS — D75839 Thrombocytosis, unspecified: Secondary | ICD-10-CM

## 2022-02-01 DIAGNOSIS — D72829 Elevated white blood cell count, unspecified: Secondary | ICD-10-CM | POA: Diagnosis not present

## 2022-02-01 LAB — CBC WITH DIFFERENTIAL (CANCER CENTER ONLY)
Abs Immature Granulocytes: 0.01 10*3/uL (ref 0.00–0.07)
Basophils Absolute: 0 10*3/uL (ref 0.0–0.1)
Basophils Relative: 0 %
Eosinophils Absolute: 0.3 10*3/uL (ref 0.0–0.5)
Eosinophils Relative: 4 %
HCT: 32.2 % — ABNORMAL LOW (ref 36.0–46.0)
Hemoglobin: 10.5 g/dL — ABNORMAL LOW (ref 12.0–15.0)
Immature Granulocytes: 0 %
Lymphocytes Relative: 27 %
Lymphs Abs: 2.1 10*3/uL (ref 0.7–4.0)
MCH: 28.7 pg (ref 26.0–34.0)
MCHC: 32.6 g/dL (ref 30.0–36.0)
MCV: 88 fL (ref 80.0–100.0)
Monocytes Absolute: 0.5 10*3/uL (ref 0.1–1.0)
Monocytes Relative: 7 %
Neutro Abs: 4.9 10*3/uL (ref 1.7–7.7)
Neutrophils Relative %: 62 %
Platelet Count: 456 10*3/uL — ABNORMAL HIGH (ref 150–400)
RBC: 3.66 MIL/uL — ABNORMAL LOW (ref 3.87–5.11)
RDW: 13.2 % (ref 11.5–15.5)
WBC Count: 7.9 10*3/uL (ref 4.0–10.5)
nRBC: 0 % (ref 0.0–0.2)

## 2022-02-01 NOTE — Assessment & Plan Note (Signed)
Lab review  02/03/2021: Platelets 588, hemoglobin 12.4: WBC 11.2 06/29/2020: Platelets 540, hemoglobin 11.3, white count 10.9 10/30/2020: Platelets 508, hemoglobin 11, WBC 8.6 06/02/2021: WBC 19, platelets 582, ANC 14.4  06/24/2021: WBC 11.2, hemoglobin 12.1, platelets 539, JAK2 and MPN panel: Negative, flow cytometry: No lymphocyte abnormal subsets 02/01/2022:   I discussed with the patient that it is most like related to underlying inflammation as result of sarcoidosis. Return to clinic in 6 months for labs and follow-up

## 2022-02-01 NOTE — Progress Notes (Signed)
Patient Care Team: Philmore Pali, NP (Inactive) as PCP - General (Nurse Practitioner) Philmore Pali, NP (Inactive) (Nurse Practitioner)  DIAGNOSIS:  Encounter Diagnosis  Name Primary?   Thrombocytosis Yes      CHIEF COMPLIANT: Thrombocytosis and leukocytosis    INTERVAL HISTORY: Jill [name] is a 56 y.o. female is here because of recent diagnosis of Thrombocytosis and leukocytosis. She presents to the clinic today for a follow-up.  She reports no new problems or concerns.  Today her platelet count is 539.  This is relatively stable from a year ago.   ALLERGIES:  is allergic to pb-hyoscy-atropine-scopolamine, canagliflozin, latex, phenobarbital-belladonna alk, and sulfamethoxazole.  MEDICATIONS:  Current Outpatient Medications  Medication Sig Dispense Refill   acetaminophen (TYLENOL) 325 MG tablet Take 650 mg by mouth every 6 (six) hours as needed for mild pain or fever.      albuterol (PROVENTIL HFA;VENTOLIN HFA) 108 (90 Base) MCG/ACT inhaler Inhale 1-2 puffs into the lungs every 6 (six) hours as needed for wheezing or shortness of breath.      amphetamine-dextroamphetamine (ADDERALL) 20 MG tablet Take 20 mg by mouth 2 (two) times daily.      aspirin EC 81 MG tablet Take 81 mg by mouth daily.      buPROPion (WELLBUTRIN SR) 150 MG 12 hr tablet Take 150 mg by mouth 2 (two) times daily.  1   dorzolamide-timolol (COSOPT) 22.3-6.8 MG/ML ophthalmic solution Place 1 drop into both eyes 2 (two) times daily.      DULoxetine (CYMBALTA) 60 MG capsule Take 60 mg by mouth 2 (two) times daily.   1   fexofenadine (ALLEGRA) 180 MG tablet Take 180 mg by mouth daily.     hydrOXYzine (ATARAX/VISTARIL) 25 MG tablet Take 25 mg by mouth 3 (three) times daily.      insulin NPH Human (HUMULIN N,NOVOLIN N) 100 UNIT/ML injection Inject 10 Units into the skin 3 (three) times daily before meals.     latanoprost (XALATAN) 0.005 % ophthalmic solution Place 1 drop into both eyes at bedtime.   11    lisinopril-hydrochlorothiazide (PRINZIDE,ZESTORETIC) 20-12.5 MG tablet Take 1 tablet by mouth daily.     lovastatin (MEVACOR) 20 MG tablet Take 20 mg by mouth at bedtime.      metFORMIN (GLUCOPHAGE-XR) 500 MG 24 hr tablet Take 1,000 mg by mouth 2 (two) times daily.      metoCLOPramide (REGLAN) 10 MG tablet Take 1 tablet (10 mg total) by mouth daily.     Oxcarbazepine (TRILEPTAL) 300 MG tablet Take 1 tablet (300 mg total) by mouth daily.     polyvinyl alcohol (LIQUIFILM TEARS) 1.4 % ophthalmic solution Place 1 drop into both eyes 2 (two) times daily as needed for dry eyes.      rOPINIRole (REQUIP) 0.5 MG tablet Take 0.5 mg by mouth at bedtime.     valACYclovir (VALTREX) 500 MG tablet Take 1 tablet (500 mg total) by mouth 3 (three) times daily. 30 tablet 0   No current facility-administered medications for this visit.    PHYSICAL EXAMINATION: ECOG PERFORMANCE STATUS: 0 - Asymptomatic  Vitals:   02/01/22 1128  BP: (!) 170/69  Pulse: 96  Resp: 18  Temp: (!) 97.5 F (36.4 C)  SpO2: 100%   Filed Weights   02/01/22 1128  Weight: 148 lb 4.8 oz (67.3 kg)      LABORATORY DATA:  I have reviewed the data as listed    Latest Ref Rng & Units 06/29/2020  2:45 PM 06/29/2020    2:00 PM 06/28/2018    9:45 PM  CMP  Glucose 70 - 99 mg/dL 340  341  83   BUN 6 - 20 mg/dL _0 Creatinine 0.44 - 1.00 mg/dL 1.30  1.41  1.54   Sodium 135 - 145 mmol/L 136  135  134   Potassium 3.5 - 5.1 mmol/L 5.3  5.3  5.1   Chloride 98 - 111 mmol/L 101  99  100   CO2 22 - 32 mmol/L  26  21   Calcium 8.9 - 10.3 mg/dL  9.7  9.7   Total Protein 6.5 - 8.1 g/dL  7.0  7.7   Total Bilirubin 0.3 - 1.2 mg/dL  0.5  0.6   Alkaline Phos 38 - 126 U/L  252  206   AST 15 - 41 U/L  33  36   ALT 0 - 44 U/L  59  53     Lab Results  Component Value Date   WBC 7.9 02/01/2022   HGB 10.5 (L) 02/01/2022   HCT 32.2 (L) 02/01/2022   MCV 88.0 02/01/2022   PLT 456 (H) 02/01/2022   NEUTROABS 4.9 02/01/2022     ASSESSMENT & PLAN:  Thrombocytosis Lab review  02/03/2021: Platelets 588, hemoglobin 12.4: WBC 11.2 06/29/2020: Platelets 540, hemoglobin 11.3, white count 10.9 10/30/2020: Platelets 508, hemoglobin 11, WBC 8.6 06/02/2021: WBC 19, platelets 582, ANC 14.4  06/24/2021: WBC 11.2, hemoglobin 12.1, platelets 539, JAK2 and MPN panel: Negative, flow cytometry: No lymphocyte abnormal subsets 02/01/2022: WBC 11.2, hemoglobin 12.1, platelets 539   I discussed with the patient that it is most like related to underlying inflammation as result of sarcoidosis. I called the patient a couple of times but it went to the voicemail but there was no way we could leave a message because of full mailbox.  Return to clinic in 1 year for labs and follow-up   Orders Placed This Encounter  Procedures   CBC with Differential ([facility] Only)    Standing Status:   Future    Number of Occurrences:   1    Standing Expiration Date:   02/02/2023   The patient has a good understanding of the overall plan. she agrees with it. she will call with any problems that may develop before the next visit here. Total time spent: 30 mins including face to face time and time spent for planning, charting and co-ordination of care   Harriette Ohara, MD 02/01/22    I Gardiner Coins am scribing for Dr. Lindi Adie  I have reviewed the above documentation for accuracy and completeness, and I agree with the above.

## 2022-02-02 ENCOUNTER — Encounter: Payer: Self-pay | Admitting: Hematology and Oncology

## 2022-02-02 ENCOUNTER — Telehealth: Payer: Self-pay | Admitting: Hematology and Oncology

## 2022-02-02 NOTE — Telephone Encounter (Signed)
Scheduled appointment per 11/7 los. Unable to leave a voicemail due to patients mailbox being full.

## 2022-02-04 ENCOUNTER — Encounter: Payer: Self-pay | Admitting: Internal Medicine

## 2022-02-04 ENCOUNTER — Encounter: Payer: Self-pay | Admitting: Hematology and Oncology

## 2022-02-04 ENCOUNTER — Ambulatory Visit (INDEPENDENT_AMBULATORY_CARE_PROVIDER_SITE_OTHER): Payer: Medicare Other | Admitting: Internal Medicine

## 2022-02-04 VITALS — BP 134/80 | HR 100 | Ht 61.0 in | Wt 145.0 lb

## 2022-02-04 DIAGNOSIS — E1065 Type 1 diabetes mellitus with hyperglycemia: Secondary | ICD-10-CM

## 2022-02-04 DIAGNOSIS — E785 Hyperlipidemia, unspecified: Secondary | ICD-10-CM

## 2022-02-04 DIAGNOSIS — E103593 Type 1 diabetes mellitus with proliferative diabetic retinopathy without macular edema, bilateral: Secondary | ICD-10-CM | POA: Insufficient documentation

## 2022-02-04 DIAGNOSIS — E1042 Type 1 diabetes mellitus with diabetic polyneuropathy: Secondary | ICD-10-CM | POA: Insufficient documentation

## 2022-02-04 MED ORDER — INSULIN PEN NEEDLE 32G X 4 MM MISC: 1.0000 | Freq: Four times a day (QID) | 3 refills | Status: DC

## 2022-02-04 MED ORDER — NOVOLIN N FLEXPEN 100 UNIT/ML ~~LOC~~ SUPN: 14.0000 [IU] | PEN_INJECTOR | Freq: Every day | SUBCUTANEOUS | 3 refills | Status: DC

## 2022-02-04 MED ORDER — METFORMIN HCL ER 500 MG PO TB24: 500.0000 mg | ORAL_TABLET | Freq: Two times a day (BID) | ORAL | 3 refills | Status: DC

## 2022-02-04 MED ORDER — NOVOLIN R FLEXPEN 100 UNIT/ML IJ SOPN: PEN_INJECTOR | INTRAMUSCULAR | 3 refills | Status: DC

## 2022-02-04 NOTE — Patient Instructions (Addendum)
STOP Novolin Mix  Decrease Metformin 500 mg , 1 tablet twice daily  Start Novolin -N (NPH) 14 units at BEDTIME Start Novolin-R ( regular) 6 units before each meal ( preferable 20 minutes before the meal) Novolon-R  correctional insulin: ADD extra units on insulin to your meal-time Novolin-R  dose if your blood sugars are higher than 180. Use the scale below to help guide you BEFORE each meal   Blood sugar before meal Number of units to inject  Less than 180 0 unit  180 -  230 1 units  231 -  280 2 units  281 -  330 3 units  331 -  380 4 units  381 -  430 5 units  431 -  480 6 units  481 -  530 7 units    Please check out Dexcom ( replace freestyle libre), Omnipod pump and tandem pump    HOW TO TREAT LOW BLOOD SUGARS (Blood sugar LESS THAN 70 MG/DL) Please follow the RULE OF 15 for the treatment of hypoglycemia treatment (when your (blood sugars are less than 70 mg/dL)   STEP 1: Take 15 grams of carbohydrates when your blood sugar is low, which includes:  3-4 GLUCOSE TABS  OR 3-4 OZ OF JUICE OR REGULAR SODA OR ONE TUBE OF GLUCOSE GEL    STEP 2: RECHECK blood sugar in 15 MINUTES STEP 3: If your blood sugar is still low at the 15 minute recheck --> then, go back to STEP 1 and treat AGAIN with another 15 grams of carbohydrates.

## 2022-02-04 NOTE — Progress Notes (Signed)
Name: Jill [name]  MRN/ DOB: [id number], May 09, 1965   Age/ Sex: 56 y.o., female    PCP: Philmore Pali, NP (Inactive)   Reason for Endocrinology Evaluation: Type 2 Diabetes Mellitus     Date of Initial Endocrinology Visit: 02/04/2022     PATIENT IDENTIFIER: Jill [name] is a 56 y.o. female with a past medical history of HTN, DM and dyslipidemia. The patient presented for initial endocrinology clinic visit on 02/04/2022 for consultative assistance with her diabetes management.    HPI: Jill [name] was    Diagnosed with DM at age 39 Prior Medications tried/Intolerance: Semlyn cost-prohibited  Currently checking blood sugars multiple  x / day,  freestyle libre Hypoglycemia episodes : yes               Symptoms: yes                 Frequency: variable   Hemoglobin A1c has ranged from 7.3% in 2008, peaking at 13.3% in 2023. Patient required assistance for hypoglycemia: no Patient has required hospitalization within the last 1 year from hyper or hypoglycemia: no  In terms of diet, the patient eats 3 meals a day   She is on Medicare alone and insulin analogues are cost prohibitive  Has occasional vomiting that she attributes to Metformin   HOME DIABETES REGIMEN: Metformin 500 mg XR 2 tabs BID  Novolin 70/30  15 units TID    Statin: yes ACE-I/ARB: yes    CONTINUOUS GLUCOSE MONITORING RECORD INTERPRETATION    Dates of Recording: 10/28-11/12/2021  Sensor description:freestyle libre   Results statistics:   CGM use % of time 38  Average and SD 270/28.1  Time in range  12      %  % Time Above 180 31  % Time above 250 57  % Time Below target 0   Glycemic patterns summary: high during the day and night   Hyperglycemic episodes  all day and night   Hypoglycemic episodes occurred n/a  Overnight periods: high     DIABETIC COMPLICATIONS: Microvascular complications:  DR with macular edema stable ( legally blind) , neuropathy Denies:  CKD Last eye exam: Completed 09/2021  Macrovascular complications:   Denies: CAD, PVD, CVA   PAST HISTORY: Past Medical History:  Past Medical History:  Diagnosis Date   ADHD    Arthralgia    Depression    Diabetes mellitus without complication ([place])    Diabetic retinopathy ([place])    Glaucoma    Hyperlipidemia    Hypertension    Macular edema    Visual impairment    Past Surgical History:  Past Surgical History:  Procedure Laterality Date   CATARACT EXTRACTION     left eye   CESAREAN SECTION     left foot surgery      Social History:  reports that she has never smoked. She has never used smokeless tobacco. She reports that she does not currently use alcohol. She reports that she does not currently use drugs. Family History:  Family History  Problem Relation Age of Onset   Stroke Father    Stroke Paternal Grandfather      HOME MEDICATIONS: Allergies as of 02/04/2022       Reactions   Pb-hyoscy-atropine-scopolamine    Canagliflozin Rash   Latex Rash   Blistery rash   Phenobarbital-belladonna Alk Rash   Sulfamethoxazole Itching        Medication List  Accurate as of February 04, 2022  8:32 AM. If you have any questions, ask your nurse or doctor.          STOP taking these medications    acetaminophen 325 MG tablet Commonly known as: TYLENOL Stopped by: Dorita Sciara, MD   dorzolamide-timolol 2-0.5 % ophthalmic solution Commonly known as: COSOPT Stopped by: Dorita Sciara, MD   insulin NPH Human 100 UNIT/ML injection Commonly known as: NOVOLIN N Stopped by: Dorita Sciara, MD   latanoprost 0.005 % ophthalmic solution Commonly known as: XALATAN Stopped by: Dorita Sciara, MD   lovastatin 20 MG tablet Commonly known as: MEVACOR Stopped by: Dorita Sciara, MD   rOPINIRole 0.5 MG tablet Commonly known as: REQUIP Stopped by: Dorita Sciara, MD       TAKE these medications    albuterol 108  (90 Base) MCG/ACT inhaler Commonly known as: VENTOLIN HFA Inhale 1-2 puffs into the lungs every 6 (six) hours as needed for wheezing or shortness of breath.   amphetamine-dextroamphetamine 20 MG tablet Commonly known as: ADDERALL Take 20 mg by mouth 2 (two) times daily.   aspirin EC 81 MG tablet Take 81 mg by mouth daily.   buPROPion 150 MG 12 hr tablet Commonly known as: WELLBUTRIN SR Take 150 mg by mouth 2 (two) times daily.   DULoxetine 60 MG capsule Commonly known as: CYMBALTA Take 60 mg by mouth 2 (two) times daily.   fexofenadine 180 MG tablet Commonly known as: ALLEGRA Take 180 mg by mouth daily.   hydrOXYzine 25 MG tablet Commonly known as: ATARAX Take 25 mg by mouth 3 (three) times daily.   lisinopril-hydrochlorothiazide 20-25 MG tablet Commonly known as: ZESTORETIC Take 2 tablets by mouth at bedtime. What changed: Another medication with the same name was removed. Continue taking this medication, and follow the directions you see here. Changed by: Dorita Sciara, MD   metFORMIN 500 MG 24 hr tablet Commonly known as: GLUCOPHAGE-XR Take 1,000 mg by mouth 2 (two) times daily.   metoCLOPramide 10 MG tablet Commonly known as: REGLAN Take 1 tablet (10 mg total) by mouth daily.   Oxcarbazepine 300 MG tablet Commonly known as: Trileptal Take 1 tablet (300 mg total) by mouth daily.   polyvinyl alcohol 1.4 % ophthalmic solution Commonly known as: LIQUIFILM TEARS Place 1 drop into both eyes 2 (two) times daily as needed for dry eyes.   rosuvastatin 20 MG tablet Commonly known as: CRESTOR Take 20 mg by mouth daily.   valACYclovir 500 MG tablet Commonly known as: VALTREX Take 1 tablet (500 mg total) by mouth 3 (three) times daily.         ALLERGIES: Allergies  Allergen Reactions   Pb-Hyoscy-Atropine-Scopolamine    Canagliflozin Rash   Latex Rash    Blistery rash   Phenobarbital-Belladonna Alk Rash   Sulfamethoxazole Itching     REVIEW OF  SYSTEMS: A comprehensive ROS was conducted with the patient and is negative except as per HPI and below:  Review of Systems  Gastrointestinal:  Positive for vomiting. Negative for abdominal pain and nausea.       Occasional vomiting   Neurological:  Positive for tingling.      OBJECTIVE:   VITAL SIGNS: BP 134/80 (BP Location: Left Arm, Patient Position: Sitting, Cuff Size: Small)   Pulse 100   Ht _0  (1.549 m)   Wt 145 lb (65.8 kg)   SpO2 96%   BMI 27.40 kg/m  PHYSICAL EXAM:  General: Pt appears well and is in NAD  Neck: General: Supple without adenopathy or carotid bruits. Thyroid: Thyroid size normal.  No goiter or nodules appreciated.   Lungs: Clear with good BS bilat with no rales, rhonchi, or wheezes  Heart: RRR   Abdomen:  soft, nontender  Extremities:  Lower extremities - No pretibial edema. No lesions.  Neuro: MS is good with appropriate affect, pt is alert and Ox3     DATA REVIEWED:  Lab Results  Component Value Date   HGBA1C (H) 10/11/2006    7.3 (NOTE)   The ADA recommends the following therapeutic goals for glycemic   control related to Hgb A1C measurement:   Goal of Therapy:   < 7.0% Hgb A1C   Action Suggested:  > 8.0% Hgb A1C   Ref:  Diabetes Care, 22, Suppl. 1, 1999   01/05/2022 BUN 18 Cr 0.910 GFR 75 LDL 104 A1c 13.3%    ASSESSMENT / PLAN / RECOMMENDATIONS:   1) Type 1 Diabetes Mellitus, Poorly controlled, With Retinopathic and neuropathic  complications - Most recent A1c of 13.3 %. Goal A1c < 7.0 %.    -Phenotypically she looks like type I, there has been a question whether she has type II -Discussed pharmacokinetics of basal/bolus insulin and the importance of taking prandial insulin with meals.  We also discussed avoiding sugar-sweetened beverages and snacks, when possible.  -We discussed the safety profile and switching to insulin analogs but they are costly for the patient and will stay with NPH and regular despite the high risk of  hypoglycemia -She does not always take her regular insulin after meals, as she is not always sure how much to eat.  I did emphasize the importance that it is preferred to take regular insulin 20-30 minutes before the meal but if she is going to use it halfway or after the meal is to make sure she checks her glucose before the meal and uses the correction scale for Premeal glucose reading rather than postmeal glucose reading - Emphasized the importance of taking Novolin and at bedtime rather than dinnertime -We will reduce metformin by 50% due to reported nausea  MEDICATIONS: Stop Novolin mix Decrease metformin 500 mg twice daily Start Novolin-and 14 units at bedtime Start Novolin-R 6 units 3 times daily before every meal Start correction factor: Novolin-R (BG -130/50)  EDUCATION / INSTRUCTIONS: BG monitoring instructions: Patient is instructed to check her blood sugars 3 times a day, before meals . Call [facility] if: BG persistently < 70  I reviewed the Rule of 15 for the treatment of hypoglycemia in detail with the patient. Literature supplied.   2) Diabetic complications:  Eye: Does  have known diabetic retinopathy.  Neuro/ Feet: Does  have known diabetic peripheral neuropathy. Renal: Patient does not have known baseline CKD. She is  on an ACEI/ARB at present.   3) Dyslipidemia :   - LDL above goal  - Discussed low fat diet   Pt on rosuvastatin 20 mg daily   Follow-up in 4 months   Signed electronically by: Mack Guise, MD  [facility]  [facility] [address] [place], [place] [address] Phone: [phone] FAX: [phone]   CC: Philmore Pali, NP (Inactive) [place] [address] Phone: [phone]  Fax: [phone]    Return to Endocrinology clinic as below: Future Appointments  Date Time Provider [place]  02/02/2023 11:00 AM [facility]  None   02/02/2023 11:30 AM Nicholas Lose, MD [facility] None

## 2022-02-15 DIAGNOSIS — N390 Urinary tract infection, site not specified: Secondary | ICD-10-CM | POA: Diagnosis not present

## 2022-03-10 DIAGNOSIS — Z8673 Personal history of transient ischemic attack (TIA), and cerebral infarction without residual deficits: Secondary | ICD-10-CM | POA: Diagnosis not present

## 2022-03-10 DIAGNOSIS — A09 Infectious gastroenteritis and colitis, unspecified: Secondary | ICD-10-CM | POA: Diagnosis not present

## 2022-03-10 DIAGNOSIS — N289 Disorder of kidney and ureter, unspecified: Secondary | ICD-10-CM | POA: Diagnosis not present

## 2022-03-10 DIAGNOSIS — Z792 Long term (current) use of antibiotics: Secondary | ICD-10-CM | POA: Diagnosis not present

## 2022-03-10 DIAGNOSIS — Z23 Encounter for immunization: Secondary | ICD-10-CM | POA: Diagnosis not present

## 2022-03-10 DIAGNOSIS — N179 Acute kidney failure, unspecified: Secondary | ICD-10-CM | POA: Diagnosis not present

## 2022-03-10 DIAGNOSIS — Z7984 Long term (current) use of oral hypoglycemic drugs: Secondary | ICD-10-CM | POA: Diagnosis not present

## 2022-03-10 DIAGNOSIS — E1043 Type 1 diabetes mellitus with diabetic autonomic (poly)neuropathy: Secondary | ICD-10-CM | POA: Diagnosis not present

## 2022-03-10 DIAGNOSIS — D75839 Thrombocytosis, unspecified: Secondary | ICD-10-CM | POA: Diagnosis not present

## 2022-03-10 DIAGNOSIS — E7800 Pure hypercholesterolemia, unspecified: Secondary | ICD-10-CM | POA: Diagnosis not present

## 2022-03-10 DIAGNOSIS — Z882 Allergy status to sulfonamides status: Secondary | ICD-10-CM | POA: Diagnosis not present

## 2022-03-10 DIAGNOSIS — M1990 Unspecified osteoarthritis, unspecified site: Secondary | ICD-10-CM | POA: Diagnosis not present

## 2022-03-10 DIAGNOSIS — F32A Depression, unspecified: Secondary | ICD-10-CM | POA: Diagnosis not present

## 2022-03-10 DIAGNOSIS — I10 Essential (primary) hypertension: Secondary | ICD-10-CM | POA: Diagnosis not present

## 2022-03-10 DIAGNOSIS — F419 Anxiety disorder, unspecified: Secondary | ICD-10-CM | POA: Diagnosis not present

## 2022-03-10 DIAGNOSIS — E1110 Type 2 diabetes mellitus with ketoacidosis without coma: Secondary | ICD-10-CM | POA: Diagnosis not present

## 2022-03-10 DIAGNOSIS — N200 Calculus of kidney: Secondary | ICD-10-CM | POA: Diagnosis not present

## 2022-03-10 DIAGNOSIS — Z79899 Other long term (current) drug therapy: Secondary | ICD-10-CM | POA: Diagnosis not present

## 2022-03-10 DIAGNOSIS — R197 Diarrhea, unspecified: Secondary | ICD-10-CM | POA: Diagnosis not present

## 2022-03-10 DIAGNOSIS — D649 Anemia, unspecified: Secondary | ICD-10-CM | POA: Diagnosis not present

## 2022-03-17 DIAGNOSIS — B3731 Acute candidiasis of vulva and vagina: Secondary | ICD-10-CM | POA: Diagnosis not present

## 2022-03-17 DIAGNOSIS — D649 Anemia, unspecified: Secondary | ICD-10-CM | POA: Diagnosis not present

## 2022-03-17 DIAGNOSIS — E1040 Type 1 diabetes mellitus with diabetic neuropathy, unspecified: Secondary | ICD-10-CM | POA: Diagnosis not present

## 2022-03-23 ENCOUNTER — Encounter: Payer: Self-pay | Admitting: Hematology and Oncology

## 2022-04-02 DIAGNOSIS — R5381 Other malaise: Secondary | ICD-10-CM | POA: Diagnosis not present

## 2022-04-02 DIAGNOSIS — J00 Acute nasopharyngitis [common cold]: Secondary | ICD-10-CM | POA: Diagnosis not present

## 2022-04-06 DIAGNOSIS — J069 Acute upper respiratory infection, unspecified: Secondary | ICD-10-CM | POA: Diagnosis not present

## 2022-04-06 DIAGNOSIS — J029 Acute pharyngitis, unspecified: Secondary | ICD-10-CM | POA: Diagnosis not present

## 2022-04-11 ENCOUNTER — Encounter: Payer: Self-pay | Admitting: Internal Medicine

## 2022-04-11 ENCOUNTER — Ambulatory Visit (INDEPENDENT_AMBULATORY_CARE_PROVIDER_SITE_OTHER): Payer: Medicare Other | Admitting: Internal Medicine

## 2022-04-11 VITALS — BP 124/82 | HR 85 | Ht 61.0 in | Wt 142.0 lb

## 2022-04-11 DIAGNOSIS — E103593 Type 1 diabetes mellitus with proliferative diabetic retinopathy without macular edema, bilateral: Secondary | ICD-10-CM | POA: Diagnosis not present

## 2022-04-11 DIAGNOSIS — E1042 Type 1 diabetes mellitus with diabetic polyneuropathy: Secondary | ICD-10-CM

## 2022-04-11 DIAGNOSIS — E1065 Type 1 diabetes mellitus with hyperglycemia: Secondary | ICD-10-CM

## 2022-04-11 LAB — POCT GLYCOSYLATED HEMOGLOBIN (HGB A1C): Hemoglobin A1C: 9.1 % — AB (ref 4.0–5.6)

## 2022-04-11 MED ORDER — INSULIN PEN NEEDLE 32G X 4 MM MISC: 1.0000 | Freq: Four times a day (QID) | 3 refills | Status: DC

## 2022-04-11 MED ORDER — NOVOLIN R FLEXPEN 100 UNIT/ML IJ SOPN: PEN_INJECTOR | INTRAMUSCULAR | 3 refills | Status: DC

## 2022-04-11 MED ORDER — NOVOLIN N FLEXPEN 100 UNIT/ML ~~LOC~~ SUPN: 16.0000 [IU] | PEN_INJECTOR | Freq: Every day | SUBCUTANEOUS | 3 refills | Status: DC

## 2022-04-11 NOTE — Patient Instructions (Addendum)
  Continue Metformin 500 mg , 1 tablet twice daily  Increase Novolin -N (NPH) 16 units at BEDTIME Increase Novolin-R ( regular) 8 units before each meal ( preferable 20 minutes before the meal) Novolon-R  correctional insulin: ADD extra units on insulin to your meal-time Novolin-R  dose if your blood sugars are higher than 180. Use the scale below to help guide you BEFORE each meal   Blood sugar before meal Number of units to inject  Less than 180 0 unit  180 -  230 1 units  231 -  280 2 units  281 -  330 3 units  331 -  380 4 units  381 -  430 5 units  431 -  480 6 units  481 -  530 7 units      HOW TO TREAT LOW BLOOD SUGARS (Blood sugar LESS THAN 70 MG/DL) Please follow the RULE OF 15 for the treatment of hypoglycemia treatment (when your (blood sugars are less than 70 mg/dL)   STEP 1: Take 15 grams of carbohydrates when your blood sugar is low, which includes:  3-4 GLUCOSE TABS  OR 3-4 OZ OF JUICE OR REGULAR SODA OR ONE TUBE OF GLUCOSE GEL    STEP 2: RECHECK blood sugar in 15 MINUTES STEP 3: If your blood sugar is still low at the 15 minute recheck --> then, go back to STEP 1 and treat AGAIN with another 15 grams of carbohydrates.

## 2022-04-11 NOTE — Progress Notes (Addendum)
Name: Jill [name]  MRN/ DOB: [id number], 1965-06-13   Age/ Sex: 57 y.o., female    PCP: Philmore Pali, NP (Inactive)   Reason for Endocrinology Evaluation: Type 1 Diabetes Mellitus     Date of Initial Endocrinology Visit: 02/04/2022    PATIENT IDENTIFIER: Jill [name] is a 57 y.o. female with a past medical history of HTN, DM and dyslipidemia. The patient presented for initial endocrinology clinic visit on 02/04/2022 for consultative assistance with her diabetes management.    HPI: Jill [name] was    Diagnosed with DM at age 68 Prior Medications tried/Intolerance: Semlyn cost-prohibited  Hemoglobin A1c has ranged from 7.3% in 2008, peaking at 13.3% in 2023.  On her initial visit to our clinic she had an A1c of 13.3%, she was unclear whether she was diagnosed with type I versus type 2 diabetes, but phenotypically she was more towards type I.  She was on metformin, and insulin mix.  We decreased metformin by 50% due to vomiting that she attributed to metformin, we started her on NPH and regular insulin as well as correction scale, due to insurance issues, insulin analogs were cost prohibitive, hence we kept her on NPH and regular  SUBJECTIVE:   During the last visit (02/04/2022): A1c 13.3%  Today (04/11/22): Ms. [name] is here for follow-up on diabetes management.  She checks her  blood sugars multiple  times daily. The patient has  had hypoglycemic episodes since the last clinic visit.   Since her last visit here, the patient has been treated for colitis , presenting with diarrhea and vomiting.  Symptoms have resolved Denies fever recently     [place]: Metformin 500 mg XR 1 tabs BID  Novolin-N 14 units nightly Novolin-R 7 units 3 times daily before every meal Correction scale: Regular (BG -130/50)    Statin: yes ACE-I/ARB: yes    CONTINUOUS GLUCOSE MONITORING RECORD INTERPRETATION    Dates of Recording:  1/2-1/15/2024  Sensor description:freestyle libre   Results statistics:   CGM use % of time 96  Average and SD 230/34.2  Time in range  25   %  % Time Above 180 35  % Time above 250 39  % Time Below target 1   Glycemic patterns summary: high during the day and night   Hyperglycemic episodes  all day and night   Hypoglycemic episodes occurred after a bolus  Overnight periods: high      DIABETIC COMPLICATIONS: Microvascular complications:  DR with macular edema stable ( legally blind) , neuropathy Denies: CKD Last eye exam: Completed 09/2021  Macrovascular complications:   Denies: CAD, PVD, CVA   PAST HISTORY: Past Medical History:  Past Medical History:  Diagnosis Date   ADHD    Arthralgia    Depression    Diabetes mellitus without complication ([place])    Diabetic retinopathy ([place])    Glaucoma    Hyperlipidemia    Hypertension    Macular edema    Visual impairment    Past Surgical History:  Past Surgical History:  Procedure Laterality Date   CATARACT EXTRACTION     left eye   CESAREAN SECTION     left foot surgery      Social History:  reports that she has never smoked. She has never used smokeless tobacco. She reports that she does not currently use alcohol. She reports that she does not currently use drugs. Family History:  Family History  Problem Relation Age of Onset  Stroke Father    Stroke Paternal Grandfather      HOME MEDICATIONS: Allergies as of 04/11/2022       Reactions   Pb-hyoscy-atropine-scopolamine    Canagliflozin Rash   Latex Rash   Blistery rash   Phenobarbital-belladonna Alk Rash   Sulfamethoxazole Itching        Medication List        Accurate as of April 11, 2022 10:34 AM. If you have any questions, ask your nurse or doctor.          albuterol 108 (90 Base) MCG/ACT inhaler Commonly known as: VENTOLIN HFA Inhale 1-2 puffs into the lungs every 6 (six) hours as needed for wheezing or shortness of breath.    amphetamine-dextroamphetamine 20 MG tablet Commonly known as: ADDERALL Take 20 mg by mouth 2 (two) times daily.   aspirin EC 81 MG tablet Take 81 mg by mouth daily.   buPROPion 150 MG 12 hr tablet Commonly known as: WELLBUTRIN SR Take 150 mg by mouth 2 (two) times daily.   DULoxetine 60 MG capsule Commonly known as: CYMBALTA Take 60 mg by mouth 2 (two) times daily.   fexofenadine 180 MG tablet Commonly known as: ALLEGRA Take 180 mg by mouth daily.   hydrOXYzine 25 MG tablet Commonly known as: ATARAX Take 25 mg by mouth 3 (three) times daily.   Insulin Pen Needle 32G X 4 MM Misc 1 Device by Does not apply route in the morning, at noon, in the evening, and at bedtime.   lisinopril-hydrochlorothiazide 20-25 MG tablet Commonly known as: ZESTORETIC Take 2 tablets by mouth at bedtime.   metFORMIN 500 MG 24 hr tablet Commonly known as: GLUCOPHAGE-XR Take 1 tablet (500 mg total) by mouth 2 (two) times daily with a meal.   metoCLOPramide 10 MG tablet Commonly known as: REGLAN Take 1 tablet (10 mg total) by mouth daily.   NovoLIN N FlexPen 100 UNIT/ML Kiwkpen Generic drug: Insulin NPH (Human) (Isophane) Inject 16 Units into the skin at bedtime. What changed: how much to take Changed by: Dorita Sciara, MD   NovoLIN R FlexPen ReliOn 100 UNIT/ML KwikPen Generic drug: Insulin Regular Human Max daily 30 units   Oxcarbazepine 300 MG tablet Commonly known as: Trileptal Take 1 tablet (300 mg total) by mouth daily.   penicillin v potassium 500 MG tablet Commonly known as: VEETID Take 500 mg by mouth 2 (two) times daily.   polyvinyl alcohol 1.4 % ophthalmic solution Commonly known as: LIQUIFILM TEARS Place 1 drop into both eyes 2 (two) times daily as needed for dry eyes.   rosuvastatin 20 MG tablet Commonly known as: CRESTOR Take 20 mg by mouth daily.   valACYclovir 500 MG tablet Commonly known as: VALTREX Take 1 tablet (500 mg total) by mouth 3 (three) times  daily.         ALLERGIES: Allergies  Allergen Reactions   Pb-Hyoscy-Atropine-Scopolamine    Canagliflozin Rash   Latex Rash    Blistery rash   Phenobarbital-Belladonna Alk Rash   Sulfamethoxazole Itching     REVIEW OF SYSTEMS: A comprehensive ROS was conducted with the patient and is negative except as per HPI   OBJECTIVE:   VITAL SIGNS: BP 124/82 (BP Location: Right Arm, Patient Position: Sitting, Cuff Size: Small)   Pulse 85   Ht '5\' 1"'$  (1.549 m)   Wt 142 lb (64.4 kg)   SpO2 98%   BMI 26.83 kg/m    PHYSICAL EXAM:  General: Pt appears well and  is in NAD  Neck: General: Supple without adenopathy or carotid bruits. Thyroid: Thyroid size normal.  No goiter or nodules appreciated.   Lungs: Clear with good BS bilat with no rales, rhonchi, or wheezes  Heart: RRR   Abdomen:  soft, nontender  Extremities:  Lower extremities - No pretibial edema. No lesions.  Neuro: MS is good with appropriate affect, pt is alert and Ox3     DATA REVIEWED:  Lab Results  Component Value Date   HGBA1C 9.1 (A) 04/11/2022   HGBA1C (H) 10/11/2006    7.3 (NOTE)   The ADA recommends the following therapeutic goals for glycemic   control related to Hgb A1C measurement:   Goal of Therapy:   < 7.0% Hgb A1C   Action Suggested:  > 8.0% Hgb A1C   Ref:  Diabetes Care, 22, Suppl. 1, 1999   01/05/2022 BUN 18 Cr 0.910 GFR 75 LDL 104 A1c 13.3%    ASSESSMENT / PLAN / RECOMMENDATIONS:   1) Type 1 Diabetes Mellitus, Poorly controlled, With Retinopathic and neuropathic  complications - Most recent A1c of 9.1 %. Goal A1c < 7.0 %.    -I have praised the patient on improved glycemic control from 13.3% to 9.1% -In reviewing her CGM download her time in range have increased from 12% to 25%, and her BG is above 250, have decreased from 57% to 39% -The patient has been noted with insulin stacking, hence hypoglycemia during the day.  I did advise the patient to separate regular insulin by 4 hours to  prevent stacking, she was again advised to take it 20 minutes before each meal -I will increase her NPH and regular insulin as below -She initially received the insulin pens, but switch to vials because the pharmacy was out of pens -We discussed insulin technology, but she does NOT like Dexcom, hence we opted to remain on multiple daily injections because she likes to use the freestyle libre  MEDICATIONS:  Continue metformin 500 mg twice daily Increase Novolin-N 16 units at bedtime Increase Novolin-R 8 units 3 times daily before every meal Continue correction factor: Novolin-R (BG -130/50)  EDUCATION / INSTRUCTIONS: BG monitoring instructions: Patient is instructed to check her blood sugars 3 times a day, before meals . Call [facility] if: BG persistently < 70  I reviewed the Rule of 15 for the treatment of hypoglycemia in detail with the patient. Literature supplied.   2) Diabetic complications:  Eye: Does  have known diabetic retinopathy.  Neuro/ Feet: Does  have known diabetic peripheral neuropathy. Renal: Patient does not have known baseline CKD. She is  on an ACEI/ARB at present.   3) Dyslipidemia :   - LDL above goal  - Discussed low fat diet   Pt on rosuvastatin 20 mg daily   Follow-up in 4 months   Signed electronically by: Mack Guise, MD  [facility]  [facility] [address] [place], [place] [address] Phone: [phone] FAX: [phone]   CC: Philmore Pali, NP (Inactive) [place] [address] Phone: [phone]  Fax: [phone]    Return to Endocrinology clinic as below: Future Appointments  Date Time Provider [place]  06/08/2022 12:10 PM Delquan Poucher, Melanie Crazier, MD [facility] None  02/02/2023 11:00 AM [facility] None  02/02/2023 11:30 AM Nicholas Lose, MD [facility] None

## 2022-04-20 ENCOUNTER — Ambulatory Visit: Payer: Medicare Other | Admitting: Internal Medicine

## 2022-05-09 DIAGNOSIS — F419 Anxiety disorder, unspecified: Secondary | ICD-10-CM | POA: Diagnosis not present

## 2022-05-09 DIAGNOSIS — E1040 Type 1 diabetes mellitus with diabetic neuropathy, unspecified: Secondary | ICD-10-CM | POA: Diagnosis not present

## 2022-05-09 DIAGNOSIS — D75839 Thrombocytosis, unspecified: Secondary | ICD-10-CM | POA: Diagnosis not present

## 2022-05-09 DIAGNOSIS — E1143 Type 2 diabetes mellitus with diabetic autonomic (poly)neuropathy: Secondary | ICD-10-CM | POA: Diagnosis not present

## 2022-05-09 DIAGNOSIS — F339 Major depressive disorder, recurrent, unspecified: Secondary | ICD-10-CM | POA: Diagnosis not present

## 2022-05-09 DIAGNOSIS — I10 Essential (primary) hypertension: Secondary | ICD-10-CM | POA: Diagnosis not present

## 2022-05-09 DIAGNOSIS — R000 Tachycardia, unspecified: Secondary | ICD-10-CM | POA: Diagnosis not present

## 2022-05-09 DIAGNOSIS — F988 Other specified behavioral and emotional disorders with onset usually occurring in childhood and adolescence: Secondary | ICD-10-CM | POA: Diagnosis not present

## 2022-05-09 DIAGNOSIS — Z79899 Other long term (current) drug therapy: Secondary | ICD-10-CM | POA: Diagnosis not present

## 2022-05-09 DIAGNOSIS — E782 Mixed hyperlipidemia: Secondary | ICD-10-CM | POA: Diagnosis not present

## 2022-05-09 DIAGNOSIS — K3184 Gastroparesis: Secondary | ICD-10-CM | POA: Diagnosis not present

## 2022-05-09 DIAGNOSIS — N1831 Chronic kidney disease, stage 3a: Secondary | ICD-10-CM | POA: Diagnosis not present

## 2022-05-29 DIAGNOSIS — R1032 Left lower quadrant pain: Secondary | ICD-10-CM | POA: Diagnosis not present

## 2022-05-29 DIAGNOSIS — R109 Unspecified abdominal pain: Secondary | ICD-10-CM | POA: Diagnosis not present

## 2022-05-29 DIAGNOSIS — N3000 Acute cystitis without hematuria: Secondary | ICD-10-CM | POA: Diagnosis not present

## 2022-05-29 DIAGNOSIS — R079 Chest pain, unspecified: Secondary | ICD-10-CM | POA: Diagnosis not present

## 2022-05-29 DIAGNOSIS — M549 Dorsalgia, unspecified: Secondary | ICD-10-CM | POA: Diagnosis not present

## 2022-05-29 DIAGNOSIS — I447 Left bundle-branch block, unspecified: Secondary | ICD-10-CM | POA: Diagnosis not present

## 2022-05-29 DIAGNOSIS — R7989 Other specified abnormal findings of blood chemistry: Secondary | ICD-10-CM | POA: Diagnosis not present

## 2022-05-29 DIAGNOSIS — N3001 Acute cystitis with hematuria: Secondary | ICD-10-CM | POA: Diagnosis not present

## 2022-05-31 DIAGNOSIS — E162 Hypoglycemia, unspecified: Secondary | ICD-10-CM | POA: Diagnosis not present

## 2022-05-31 DIAGNOSIS — Z1152 Encounter for screening for COVID-19: Secondary | ICD-10-CM | POA: Diagnosis not present

## 2022-05-31 DIAGNOSIS — I10 Essential (primary) hypertension: Secondary | ICD-10-CM | POA: Diagnosis not present

## 2022-05-31 DIAGNOSIS — T50905A Adverse effect of unspecified drugs, medicaments and biological substances, initial encounter: Secondary | ICD-10-CM | POA: Diagnosis not present

## 2022-05-31 DIAGNOSIS — E1165 Type 2 diabetes mellitus with hyperglycemia: Secondary | ICD-10-CM | POA: Diagnosis not present

## 2022-05-31 DIAGNOSIS — R112 Nausea with vomiting, unspecified: Secondary | ICD-10-CM | POA: Diagnosis not present

## 2022-05-31 DIAGNOSIS — E861 Hypovolemia: Secondary | ICD-10-CM | POA: Diagnosis not present

## 2022-05-31 DIAGNOSIS — R109 Unspecified abdominal pain: Secondary | ICD-10-CM | POA: Diagnosis not present

## 2022-06-08 ENCOUNTER — Ambulatory Visit (INDEPENDENT_AMBULATORY_CARE_PROVIDER_SITE_OTHER): Payer: Medicare Other | Admitting: Internal Medicine

## 2022-06-08 ENCOUNTER — Encounter: Payer: Self-pay | Admitting: Internal Medicine

## 2022-06-08 VITALS — BP 136/84 | HR 88 | Ht 61.0 in | Wt 145.0 lb

## 2022-06-08 DIAGNOSIS — E785 Hyperlipidemia, unspecified: Secondary | ICD-10-CM | POA: Diagnosis not present

## 2022-06-08 DIAGNOSIS — E103593 Type 1 diabetes mellitus with proliferative diabetic retinopathy without macular edema, bilateral: Secondary | ICD-10-CM

## 2022-06-08 DIAGNOSIS — E1065 Type 1 diabetes mellitus with hyperglycemia: Secondary | ICD-10-CM

## 2022-06-08 DIAGNOSIS — E1042 Type 1 diabetes mellitus with diabetic polyneuropathy: Secondary | ICD-10-CM

## 2022-06-08 LAB — POCT GLYCOSYLATED HEMOGLOBIN (HGB A1C): Hemoglobin A1C: 9.1 % — AB (ref 4.0–5.6)

## 2022-06-08 MED ORDER — METFORMIN HCL ER 500 MG PO TB24: 500.0000 mg | ORAL_TABLET | Freq: Two times a day (BID) | ORAL | 3 refills | Status: DC

## 2022-06-08 MED ORDER — NOVOLIN N FLEXPEN 100 UNIT/ML ~~LOC~~ SUPN: PEN_INJECTOR | SUBCUTANEOUS | 3 refills | Status: DC

## 2022-06-08 NOTE — Progress Notes (Signed)
Name: Jasnoor Kin  MRN/ DOB: [id number], 02/07/1966   Age/ Sex: 57 y.o., female    PCP: Philmore Pali, NP (Inactive)   Reason for Endocrinology Evaluation: Type 1 Diabetes Mellitus     Date of Initial Endocrinology Visit: 02/04/2022    PATIENT IDENTIFIER: Jill [name] is a 57 y.o. female with a past medical history of HTN,sarcoidosis, DM and dyslipidemia. The patient presented for initial endocrinology clinic visit on 02/04/2022 for consultative assistance with her diabetes management.    HPI: Ms. Goard was    Diagnosed with DM at age 66 Prior Medications tried/Intolerance: Semlyn cost-prohibited  Hemoglobin A1c has ranged from 7.3% in 2008, peaking at 13.3% in 2023.  On her initial visit to our clinic she had an A1c of 13.3%, she was unclear whether she was diagnosed with type I versus type 2 diabetes, but phenotypically she was more towards type I.  She was on metformin, and insulin mix.  We decreased metformin by 50% due to vomiting that she attributed to metformin, we started her on NPH and regular insulin as well as correction scale, due to insurance issues, insulin analogs were cost prohibitive, hence we kept her on NPH and regular  SUBJECTIVE:   During the last visit (04/11/2022): A1c 9.1%  Today (06/08/22): Ms. Koss is here for follow-up on diabetes management.  She checks her  blood sugars multiple  times daily. The patient has  had hypoglycemic episodes since the last clinic visit.  Patient presented to the ED with abdominal pain, CT was noted for left ovarian venous insufficiency concerning for pelvic congestion, a referral to GYN was done 05/29/2022  Abdominal cramps resolved, continues with nausea that she attributes to Abx    HOME DIABETES REGIMEN: Metformin 500 mg XR 1 tabs BID  Novolin-N 16 units nightly Novolin-R 8 units 3 times daily before every meal Correction scale: Regular (BG -130/50)    Statin: yes ACE-I/ARB:  yes    CONTINUOUS GLUCOSE MONITORING RECORD INTERPRETATION    Dates of Recording: 2/29-3/13/2024  Sensor description:freestyle libre   Results statistics:   CGM use % of time 97  Average and SD 266 /32.7  Time in range 20  %  % Time Above 180 20  % Time above 250 60  % Time Below target 0   Glycemic patterns summary: high during the day and night   Hyperglycemic episodes  all day and night   Hypoglycemic episodes occurred rare after supper   Overnight periods: variable     DIABETIC COMPLICATIONS: Microvascular complications:  DR with macular edema stable ( legally blind) , neuropathy Denies: CKD Last eye exam: Completed 09/2021  Macrovascular complications:   Denies: CAD, PVD, CVA   PAST HISTORY: Past Medical History:  Past Medical History:  Diagnosis Date   ADHD    Arthralgia    Depression    Diabetes mellitus without complication ([place])    Diabetic retinopathy ([place])    Glaucoma    Hyperlipidemia    Hypertension    Macular edema    Visual impairment    Past Surgical History:  Past Surgical History:  Procedure Laterality Date   CATARACT EXTRACTION     left eye   CESAREAN SECTION     left foot surgery      Social History:  reports that she has never smoked. She has never used smokeless tobacco. She reports that she does not currently use alcohol. She reports that she does not currently use drugs. Family  History:  Family History  Problem Relation Age of Onset   Stroke Father    Stroke Paternal Grandfather      HOME MEDICATIONS: Allergies as of 06/08/2022       Reactions   Pb-hyoscy-atropine-scopolamine    Canagliflozin Rash   Latex Rash   Blistery rash   Phenobarbital-belladonna Alk Rash   Sulfamethoxazole Itching        Medication List        Accurate as of June 08, 2022 12:20 PM. If you have any questions, ask your nurse or doctor.          STOP taking these medications    penicillin v potassium 500 MG tablet Commonly  known as: VEETID Stopped by: Dorita Sciara, MD       TAKE these medications    albuterol 108 (90 Base) MCG/ACT inhaler Commonly known as: VENTOLIN HFA Inhale 1-2 puffs into the lungs every 6 (six) hours as needed for wheezing or shortness of breath.   amphetamine-dextroamphetamine 20 MG tablet Commonly known as: ADDERALL Take 20 mg by mouth 2 (two) times daily.   aspirin EC 81 MG tablet Take 81 mg by mouth daily.   buPROPion 150 MG 12 hr tablet Commonly known as: WELLBUTRIN SR Take 150 mg by mouth 2 (two) times daily.   DULoxetine 60 MG capsule Commonly known as: CYMBALTA Take 60 mg by mouth 2 (two) times daily.   fexofenadine 180 MG tablet Commonly known as: ALLEGRA Take 180 mg by mouth daily.   hydrOXYzine 25 MG tablet Commonly known as: ATARAX Take 25 mg by mouth 3 (three) times daily.   Insulin Pen Needle 32G X 4 MM Misc 1 Device by Does not apply route in the morning, at noon, in the evening, and at bedtime.   lisinopril-hydrochlorothiazide 20-25 MG tablet Commonly known as: ZESTORETIC Take 2 tablets by mouth at bedtime.   metFORMIN 500 MG 24 hr tablet Commonly known as: GLUCOPHAGE-XR Take 1 tablet (500 mg total) by mouth 2 (two) times daily with a meal.   metoCLOPramide 10 MG tablet Commonly known as: REGLAN Take 1 tablet (10 mg total) by mouth daily.   NovoLIN N FlexPen 100 UNIT/ML Kiwkpen Generic drug: Insulin NPH (Human) (Isophane) Inject 16 Units into the skin at bedtime.   NovoLIN R FlexPen ReliOn 100 UNIT/ML KwikPen Generic drug: Insulin Regular Human Max daily 30 units   Oxcarbazepine 300 MG tablet Commonly known as: Trileptal Take 1 tablet (300 mg total) by mouth daily.   polyvinyl alcohol 1.4 % ophthalmic solution Commonly known as: LIQUIFILM TEARS Place 1 drop into both eyes 2 (two) times daily as needed for dry eyes.   rosuvastatin 20 MG tablet Commonly known as: CRESTOR Take 20 mg by mouth daily.   valACYclovir 500 MG  tablet Commonly known as: VALTREX Take 1 tablet (500 mg total) by mouth 3 (three) times daily.         ALLERGIES: Allergies  Allergen Reactions   Pb-Hyoscy-Atropine-Scopolamine    Canagliflozin Rash   Latex Rash    Blistery rash   Phenobarbital-Belladonna Alk Rash   Sulfamethoxazole Itching     REVIEW OF SYSTEMS: A comprehensive ROS was conducted with the patient and is negative except as per HPI   OBJECTIVE:   VITAL SIGNS: BP 136/84 (BP Location: Left Arm, Patient Position: Sitting, Cuff Size: Large)   Pulse 88   Ht '5\' 1"'$  (1.549 m)   Wt 145 lb (65.8 kg)   SpO2 97%  BMI 27.40 kg/m    PHYSICAL EXAM:  General: Pt appears well and is in NAD  Neck: General: Supple without adenopathy or carotid bruits. Thyroid: Thyroid size normal.  No goiter or nodules appreciated.   Lungs: Clear with good BS bilat with no rales, rhonchi, or wheezes  Heart: RRR   Abdomen:  soft, nontender  Extremities:  Lower extremities - No pretibial edema. No lesions.  Neuro: MS is good with appropriate affect, pt is alert and Ox3   DM Foot Exam 06/08/2022  The skin of the feet is intact without sores or ulcerations. The pedal pulses are undetectable  The sensation is intact to a screening 5.07, 10 gram monofilament bilaterally   DATA REVIEWED:  Lab Results  Component Value Date   HGBA1C 9.1 (A) 04/11/2022   HGBA1C (H) 10/11/2006    7.3 (NOTE)   The ADA recommends the following therapeutic goals for glycemic   control related to Hgb A1C measurement:   Goal of Therapy:   < 7.0% Hgb A1C   Action Suggested:  > 8.0% Hgb A1C   Ref:  Diabetes Care, 22, Suppl. 1, 1999    05/09/2022 BUN 25 Cr. 0.96 GFR 70 HDL 61 LDL 81     01/05/2022 BUN 18 Cr 0.910 GFR 75 LDL 104 A1c 13.3%    ASSESSMENT / PLAN / RECOMMENDATIONS:   1) Type 1 Diabetes Mellitus, Poorly controlled, With Retinopathic and neuropathic  complications - Most recent A1c of 9.1 %. Goal A1c < 7.0 %.     -Her A1c remains  elevated  -She does not like the Dexcom, I would like to remain on the freestyle libre -In reviewing CGM download, it appears that the patient is with inconsistent intake of insulin, when asked the patient if she is consistent with her insulin intake she did confirm compliance but upon questioning when was the last time she took NPH or what was the dose the patient could not recall -She also tells me that she took NPH yesterday in the morning ?,  Of note, the patient's prescription is to take NPH at night.  Unfortunately it appears that she uses her insulin haphazardly which causes glycemic excursions -I will give the patient an NPH dose to take in the morning and at bedtime -I have again encouraged her to continue to use regular insulin before each meal -I have attempted to switch her to pens but she remained on the vials -I will also reduce her prandial insulin as below  MEDICATIONS:  Continue metformin 500 mg twice daily Change Novolin-N 10 units every morning and 16 units at bedtime Decrease Novolin-R to 6 units 3 times daily before every meal Continue correction factor: Novolin-R (BG -130/50)  EDUCATION / INSTRUCTIONS: BG monitoring instructions: Patient is instructed to check her blood sugars 3 times a day, before meals . Call [facility] if: BG persistently < 70  I reviewed the Rule of 15 for the treatment of hypoglycemia in detail with the patient. Literature supplied.   2) Diabetic complications:  Eye: Does  have known diabetic retinopathy.  Neuro/ Feet: Does  have known diabetic peripheral neuropathy. Renal: Patient does not have known baseline CKD. She is  on an ACEI/ARB at present.   3) Dyslipidemia :   - LDL acceptable  - Discussed low fat diet   Pt on rosuvastatin 20 mg daily   Follow-up in 6 months   Signed electronically by: Mack Guise, MD  [facility]  [facility] [address] [place], [place] [address] Phone: [phone] FAX: [phone]   CC: Philmore Pali, NP (Inactive) [place] [address] Phone: [phone]  Fax: [phone]    Return to Endocrinology clinic as below: Future Appointments  Date Time Provider [place]  02/02/2023 11:00 AM [facility] None  02/02/2023 11:30 AM Nicholas Lose, MD [facility] None

## 2022-06-08 NOTE — Patient Instructions (Signed)
  Continue Metformin 500 mg , 1 tablet twice daily  Change  Novolin -N (NPH) 10 units in the morning and 16 units at BEDTIME Decrease  Novolin-R ( regular) 6 units before each meal Novolon-R  correctional insulin: ADD extra units on insulin to your meal-time Novolin-R  dose if your blood sugars are higher than 180. Use the scale below to help guide you BEFORE each meal   Blood sugar before meal Number of units to inject  Less than 180 0 unit  180 -  230 1 units  231 -  280 2 units  281 -  330 3 units  331 -  380 4 units  381 -  430 5 units  431 -  480 6 units  481 -  530 7 units      HOW TO TREAT LOW BLOOD SUGARS (Blood sugar LESS THAN 70 MG/DL) Please follow the RULE OF 15 for the treatment of hypoglycemia treatment (when your (blood sugars are less than 70 mg/dL)   STEP 1: Take 15 grams of carbohydrates when your blood sugar is low, which includes:  3-4 GLUCOSE TABS  OR 3-4 OZ OF JUICE OR REGULAR SODA OR ONE TUBE OF GLUCOSE GEL    STEP 2: RECHECK blood sugar in 15 MINUTES STEP 3: If your blood sugar is still low at the 15 minute recheck --> then, go back to STEP 1 and treat AGAIN with another 15 grams of carbohydrates.

## 2022-06-09 DIAGNOSIS — Z79899 Other long term (current) drug therapy: Secondary | ICD-10-CM | POA: Diagnosis not present

## 2022-07-07 DIAGNOSIS — N952 Postmenopausal atrophic vaginitis: Secondary | ICD-10-CM | POA: Diagnosis not present

## 2022-07-07 DIAGNOSIS — N941 Unspecified dyspareunia: Secondary | ICD-10-CM | POA: Diagnosis not present

## 2022-07-07 DIAGNOSIS — Z124 Encounter for screening for malignant neoplasm of cervix: Secondary | ICD-10-CM | POA: Diagnosis not present

## 2022-07-07 DIAGNOSIS — R739 Hyperglycemia, unspecified: Secondary | ICD-10-CM | POA: Diagnosis not present

## 2022-07-07 DIAGNOSIS — M545 Low back pain, unspecified: Secondary | ICD-10-CM | POA: Diagnosis not present

## 2022-07-07 DIAGNOSIS — Z803 Family history of malignant neoplasm of breast: Secondary | ICD-10-CM | POA: Diagnosis not present

## 2022-07-07 DIAGNOSIS — R300 Dysuria: Secondary | ICD-10-CM | POA: Diagnosis not present

## 2022-07-07 DIAGNOSIS — R809 Proteinuria, unspecified: Secondary | ICD-10-CM | POA: Diagnosis not present

## 2022-07-07 DIAGNOSIS — I862 Pelvic varices: Secondary | ICD-10-CM | POA: Diagnosis not present

## 2022-08-03 DIAGNOSIS — S61304A Unspecified open wound of right ring finger with damage to nail, initial encounter: Secondary | ICD-10-CM | POA: Diagnosis not present

## 2022-08-08 DIAGNOSIS — S61304D Unspecified open wound of right ring finger with damage to nail, subsequent encounter: Secondary | ICD-10-CM | POA: Diagnosis not present

## 2022-09-13 DIAGNOSIS — E11311 Type 2 diabetes mellitus with unspecified diabetic retinopathy with macular edema: Secondary | ICD-10-CM | POA: Diagnosis not present

## 2022-09-13 DIAGNOSIS — E103513 Type 1 diabetes mellitus with proliferative diabetic retinopathy with macular edema, bilateral: Secondary | ICD-10-CM | POA: Diagnosis not present

## 2022-09-13 DIAGNOSIS — E083513 Diabetes mellitus due to underlying condition with proliferative diabetic retinopathy with macular edema, bilateral: Secondary | ICD-10-CM | POA: Diagnosis not present

## 2022-10-06 DIAGNOSIS — L299 Pruritus, unspecified: Secondary | ICD-10-CM | POA: Diagnosis not present

## 2022-10-06 DIAGNOSIS — L501 Idiopathic urticaria: Secondary | ICD-10-CM | POA: Diagnosis not present

## 2022-10-27 DIAGNOSIS — H9203 Otalgia, bilateral: Secondary | ICD-10-CM | POA: Diagnosis not present

## 2022-10-27 DIAGNOSIS — R051 Acute cough: Secondary | ICD-10-CM | POA: Diagnosis not present

## 2022-10-27 DIAGNOSIS — R0982 Postnasal drip: Secondary | ICD-10-CM | POA: Diagnosis not present

## 2022-10-27 DIAGNOSIS — J0100 Acute maxillary sinusitis, unspecified: Secondary | ICD-10-CM | POA: Diagnosis not present

## 2022-10-27 DIAGNOSIS — R0981 Nasal congestion: Secondary | ICD-10-CM | POA: Diagnosis not present

## 2022-10-27 DIAGNOSIS — R5381 Other malaise: Secondary | ICD-10-CM | POA: Diagnosis not present

## 2022-10-28 DIAGNOSIS — N183 Chronic kidney disease, stage 3 unspecified: Secondary | ICD-10-CM | POA: Diagnosis not present

## 2022-10-28 DIAGNOSIS — E083513 Diabetes mellitus due to underlying condition with proliferative diabetic retinopathy with macular edema, bilateral: Secondary | ICD-10-CM | POA: Diagnosis not present

## 2022-10-28 DIAGNOSIS — Z961 Presence of intraocular lens: Secondary | ICD-10-CM | POA: Diagnosis not present

## 2022-10-28 DIAGNOSIS — Z7984 Long term (current) use of oral hypoglycemic drugs: Secondary | ICD-10-CM | POA: Diagnosis not present

## 2022-10-28 DIAGNOSIS — Z794 Long term (current) use of insulin: Secondary | ICD-10-CM | POA: Diagnosis not present

## 2022-10-28 DIAGNOSIS — E1122 Type 2 diabetes mellitus with diabetic chronic kidney disease: Secondary | ICD-10-CM | POA: Diagnosis not present

## 2022-10-28 DIAGNOSIS — E113513 Type 2 diabetes mellitus with proliferative diabetic retinopathy with macular edema, bilateral: Secondary | ICD-10-CM | POA: Diagnosis not present

## 2022-10-28 DIAGNOSIS — E11311 Type 2 diabetes mellitus with unspecified diabetic retinopathy with macular edema: Secondary | ICD-10-CM | POA: Diagnosis not present

## 2022-11-07 DIAGNOSIS — E1040 Type 1 diabetes mellitus with diabetic neuropathy, unspecified: Secondary | ICD-10-CM | POA: Diagnosis not present

## 2022-11-07 DIAGNOSIS — F419 Anxiety disorder, unspecified: Secondary | ICD-10-CM | POA: Diagnosis not present

## 2022-11-07 DIAGNOSIS — E782 Mixed hyperlipidemia: Secondary | ICD-10-CM | POA: Diagnosis not present

## 2022-11-07 DIAGNOSIS — E1143 Type 2 diabetes mellitus with diabetic autonomic (poly)neuropathy: Secondary | ICD-10-CM | POA: Diagnosis not present

## 2022-11-07 DIAGNOSIS — F988 Other specified behavioral and emotional disorders with onset usually occurring in childhood and adolescence: Secondary | ICD-10-CM | POA: Diagnosis not present

## 2022-11-07 DIAGNOSIS — K3184 Gastroparesis: Secondary | ICD-10-CM | POA: Diagnosis not present

## 2022-11-07 DIAGNOSIS — D75839 Thrombocytosis, unspecified: Secondary | ICD-10-CM | POA: Diagnosis not present

## 2022-11-07 DIAGNOSIS — F339 Major depressive disorder, recurrent, unspecified: Secondary | ICD-10-CM | POA: Diagnosis not present

## 2022-11-07 DIAGNOSIS — Z79899 Other long term (current) drug therapy: Secondary | ICD-10-CM | POA: Diagnosis not present

## 2022-11-07 DIAGNOSIS — I10 Essential (primary) hypertension: Secondary | ICD-10-CM | POA: Diagnosis not present

## 2022-11-07 DIAGNOSIS — N182 Chronic kidney disease, stage 2 (mild): Secondary | ICD-10-CM | POA: Diagnosis not present

## 2022-11-16 DIAGNOSIS — Z1331 Encounter for screening for depression: Secondary | ICD-10-CM | POA: Diagnosis not present

## 2022-11-16 DIAGNOSIS — Z139 Encounter for screening, unspecified: Secondary | ICD-10-CM | POA: Diagnosis not present

## 2022-11-16 DIAGNOSIS — Z9181 History of falling: Secondary | ICD-10-CM | POA: Diagnosis not present

## 2022-11-16 DIAGNOSIS — Z1231 Encounter for screening mammogram for malignant neoplasm of breast: Secondary | ICD-10-CM | POA: Diagnosis not present

## 2022-11-16 DIAGNOSIS — Z0000 Encounter for general adult medical examination without abnormal findings: Secondary | ICD-10-CM | POA: Diagnosis not present

## 2022-11-21 ENCOUNTER — Other Ambulatory Visit: Payer: Self-pay | Admitting: Family Medicine

## 2022-11-21 DIAGNOSIS — Z1231 Encounter for screening mammogram for malignant neoplasm of breast: Secondary | ICD-10-CM

## 2022-11-29 DIAGNOSIS — E11311 Type 2 diabetes mellitus with unspecified diabetic retinopathy with macular edema: Secondary | ICD-10-CM | POA: Diagnosis not present

## 2022-12-08 DIAGNOSIS — M545 Low back pain, unspecified: Secondary | ICD-10-CM | POA: Diagnosis not present

## 2022-12-08 DIAGNOSIS — E109 Type 1 diabetes mellitus without complications: Secondary | ICD-10-CM | POA: Diagnosis not present

## 2022-12-10 DIAGNOSIS — I10 Essential (primary) hypertension: Secondary | ICD-10-CM | POA: Diagnosis present

## 2022-12-10 DIAGNOSIS — K760 Fatty (change of) liver, not elsewhere classified: Secondary | ICD-10-CM | POA: Diagnosis not present

## 2022-12-10 DIAGNOSIS — I672 Cerebral atherosclerosis: Secondary | ICD-10-CM | POA: Diagnosis not present

## 2022-12-10 DIAGNOSIS — Z79899 Other long term (current) drug therapy: Secondary | ICD-10-CM | POA: Diagnosis not present

## 2022-12-10 DIAGNOSIS — K353 Acute appendicitis with localized peritonitis, without perforation or gangrene: Secondary | ICD-10-CM | POA: Diagnosis not present

## 2022-12-10 DIAGNOSIS — Z794 Long term (current) use of insulin: Secondary | ICD-10-CM | POA: Diagnosis not present

## 2022-12-10 DIAGNOSIS — Z882 Allergy status to sulfonamides status: Secondary | ICD-10-CM | POA: Diagnosis not present

## 2022-12-10 DIAGNOSIS — E785 Hyperlipidemia, unspecified: Secondary | ICD-10-CM | POA: Diagnosis present

## 2022-12-10 DIAGNOSIS — Z888 Allergy status to other drugs, medicaments and biological substances status: Secondary | ICD-10-CM | POA: Diagnosis not present

## 2022-12-10 DIAGNOSIS — Z8673 Personal history of transient ischemic attack (TIA), and cerebral infarction without residual deficits: Secondary | ICD-10-CM | POA: Diagnosis not present

## 2022-12-10 DIAGNOSIS — F32A Depression, unspecified: Secondary | ICD-10-CM | POA: Diagnosis present

## 2022-12-10 DIAGNOSIS — R410 Disorientation, unspecified: Secondary | ICD-10-CM | POA: Diagnosis not present

## 2022-12-10 DIAGNOSIS — R109 Unspecified abdominal pain: Secondary | ICD-10-CM | POA: Diagnosis not present

## 2022-12-10 DIAGNOSIS — F329 Major depressive disorder, single episode, unspecified: Secondary | ICD-10-CM | POA: Diagnosis present

## 2022-12-10 DIAGNOSIS — Z7982 Long term (current) use of aspirin: Secondary | ICD-10-CM | POA: Diagnosis not present

## 2022-12-10 DIAGNOSIS — D7389 Other diseases of spleen: Secondary | ICD-10-CM | POA: Diagnosis not present

## 2022-12-10 DIAGNOSIS — R739 Hyperglycemia, unspecified: Secondary | ICD-10-CM | POA: Diagnosis not present

## 2022-12-10 DIAGNOSIS — F419 Anxiety disorder, unspecified: Secondary | ICD-10-CM | POA: Diagnosis present

## 2022-12-10 DIAGNOSIS — E119 Type 2 diabetes mellitus without complications: Secondary | ICD-10-CM | POA: Diagnosis present

## 2022-12-10 DIAGNOSIS — K3580 Unspecified acute appendicitis: Secondary | ICD-10-CM | POA: Diagnosis not present

## 2022-12-12 ENCOUNTER — Ambulatory Visit: Payer: Medicare Other | Admitting: Internal Medicine

## 2022-12-12 NOTE — Progress Notes (Deleted)
Name: Jill [name]  MRN/ DOB: [id number], 02-Sep-1965   Age/ Sex: 57 y.o., female    PCP: Ronal Fear, NP (Inactive)   Reason for Endocrinology Evaluation: Type 1 Diabetes Mellitus     Date of Initial Endocrinology Visit: 02/04/2022    PATIENT IDENTIFIER: Jill [name] is a 57 y.o. female with a past medical history of HTN,sarcoidosis, DM and dyslipidemia. The patient presented for initial endocrinology clinic visit on 02/04/2022 for consultative assistance with her diabetes management.    HPI: Jill [name] was    Diagnosed with DM at age 57 Prior Medications tried/Intolerance: Semlyn cost-prohibited  Hemoglobin A1c has ranged from 7.3% in 2008, peaking at 13.3% in 2023.  On her initial visit to our clinic she had an A1c of 13.3%, she was unclear whether she was diagnosed with type I versus type 2 diabetes, but phenotypically she was more towards type I.  She was on metformin, and insulin mix.  We decreased metformin by 50% due to vomiting that she attributed to metformin, we started her on NPH and regular insulin as well as correction scale, due to insurance issues, insulin analogs were cost prohibitive, hence we kept her on NPH and regular  SUBJECTIVE:   During the last visit (06/08/2022): A1c 9.1%  Today (12/12/22): Jill [name] is here for follow-up on diabetes management.  She checks her  blood sugars multiple  times daily. The patient has  had hypoglycemic episodes since the last clinic visit.   She continues to follow-up with ophthalmology, receiving right eye retinal injections She follows with GYN for atrophic vaginitis  Patient presented to the ED with abdominal pain, CT was noted for left ovarian venous insufficiency concerning for pelvic congestion, a referral to GYN was done 05/29/2022  Abdominal cramps resolved, continues with nausea that she attributes to Abx    HOME DIABETES REGIMEN: Metformin 500 mg XR 1 tabs BID  Novolin-N 10 units  QAM and 16 units QHS Novolin-R 6 units 3 times daily before every meal Correction scale: Regular (BG -130/50)    Statin: yes ACE-I/ARB: yes    CONTINUOUS GLUCOSE MONITORING RECORD INTERPRETATION    Dates of Recording: 2/29-3/13/2024  Sensor description:freestyle libre   Results statistics:   CGM use % of time 97  Average and SD 266 /32.7  Time in range 20  %  % Time Above 180 20  % Time above 250 60  % Time Below target 0   Glycemic patterns summary: high during the day and night   Hyperglycemic episodes  all day and night   Hypoglycemic episodes occurred rare after supper   Overnight periods: variable     DIABETIC COMPLICATIONS: Microvascular complications:  DR with macular edema stable ( legally blind) , neuropathy Denies: CKD Last eye exam: Completed 11/29/2022  Macrovascular complications:   Denies: CAD, PVD, CVA   PAST HISTORY: Past Medical History:  Past Medical History:  Diagnosis Date   ADHD    Arthralgia    Depression    Diabetes mellitus without complication (HCC)    Diabetic retinopathy (HCC)    Glaucoma    Hyperlipidemia    Hypertension    Macular edema    Visual impairment    Past Surgical History:  Past Surgical History:  Procedure Laterality Date   CATARACT EXTRACTION     left eye   CESAREAN SECTION     left foot surgery      Social History:  reports that she has never smoked. She  has never used smokeless tobacco. She reports that she does not currently use alcohol. She reports that she does not currently use drugs. Family History:  Family History  Problem Relation Age of Onset   Stroke Father    Stroke Paternal Grandfather      HOME MEDICATIONS: Allergies as of 12/12/2022       Reactions   Pb-hyoscy-atropine-scopolamine    Canagliflozin Rash   Latex Rash   Blistery rash   Phenobarbital-belladonna Alk Rash   Sulfamethoxazole Itching        Medication List        Accurate as of December 12, 2022  7:07 AM. If  you have any questions, ask your nurse or doctor.          albuterol 108 (90 Base) MCG/ACT inhaler Commonly known as: VENTOLIN HFA Inhale 1-2 puffs into the lungs every 6 (six) hours as needed for wheezing or shortness of breath.   amphetamine-dextroamphetamine 20 MG tablet Commonly known as: ADDERALL Take 20 mg by mouth 2 (two) times daily.   aspirin EC 81 MG tablet Take 81 mg by mouth daily.   buPROPion 150 MG 12 hr tablet Commonly known as: WELLBUTRIN SR Take 150 mg by mouth 2 (two) times daily.   DULoxetine 60 MG capsule Commonly known as: CYMBALTA Take 60 mg by mouth 2 (two) times daily.   fexofenadine 180 MG tablet Commonly known as: ALLEGRA Take 180 mg by mouth daily.   hydrOXYzine 25 MG tablet Commonly known as: ATARAX Take 25 mg by mouth 3 (three) times daily.   Insulin Pen Needle 32G X 4 MM Misc 1 Device by Does not apply route in the morning, at noon, in the evening, and at bedtime.   lisinopril-hydrochlorothiazide 20-25 MG tablet Commonly known as: ZESTORETIC Take 2 tablets by mouth at bedtime.   metFORMIN 500 MG 24 hr tablet Commonly known as: GLUCOPHAGE-XR Take 1 tablet (500 mg total) by mouth 2 (two) times daily with a meal.   metoCLOPramide 10 MG tablet Commonly known as: REGLAN Take 1 tablet (10 mg total) by mouth daily.   NovoLIN N FlexPen 100 UNIT/ML FlexPen Generic drug: Insulin NPH (Human) (Isophane) Inject 10 Units into the skin daily before breakfast AND 16 Units at bedtime.   NovoLIN R FlexPen ReliOn 100 UNIT/ML FlexPen Generic drug: Insulin Regular Human Max daily 30 units   Oxcarbazepine 300 MG tablet Commonly known as: Trileptal Take 1 tablet (300 mg total) by mouth daily.   polyvinyl alcohol 1.4 % ophthalmic solution Commonly known as: LIQUIFILM TEARS Place 1 drop into both eyes 2 (two) times daily as needed for dry eyes.   rosuvastatin 20 MG tablet Commonly known as: CRESTOR Take 20 mg by mouth daily.   valACYclovir 500  MG tablet Commonly known as: VALTREX Take 1 tablet (500 mg total) by mouth 3 (three) times daily.         ALLERGIES: Allergies  Allergen Reactions   Pb-Hyoscy-Atropine-Scopolamine    Canagliflozin Rash   Latex Rash    Blistery rash   Phenobarbital-Belladonna Alk Rash   Sulfamethoxazole Itching     REVIEW OF SYSTEMS: A comprehensive ROS was conducted with the patient and is negative except as per HPI   OBJECTIVE:   VITAL SIGNS: There were no vitals taken for this visit.   PHYSICAL EXAM:  General: Pt appears well and is in NAD  Neck: General: Supple without adenopathy or carotid bruits. Thyroid: Thyroid size normal.  No goiter or nodules appreciated.  Lungs: Clear with good BS bilat with no rales, rhonchi, or wheezes  Heart: RRR   Abdomen:  soft, nontender  Extremities:  Lower extremities - No pretibial edema. No lesions.  Neuro: MS is good with appropriate affect, pt is alert and Ox3   DM Foot Exam 06/08/2022  The skin of the feet is intact without sores or ulcerations. The pedal pulses are undetectable  The sensation is intact to a screening 5.07, 10 gram monofilament bilaterally   DATA REVIEWED:  Lab Results  Component Value Date   HGBA1C 9.1 (A) 06/08/2022   HGBA1C 9.1 (A) 04/11/2022   HGBA1C (H) 10/11/2006    7.3 (NOTE)   The ADA recommends the following therapeutic goals for glycemic   control related to Hgb A1C measurement:   Goal of Therapy:   < 7.0% Hgb A1C   Action Suggested:  > 8.0% Hgb A1C   Ref:  Diabetes Care, 22, Suppl. 1, 1999    05/09/2022 BUN 25 Cr. 0.96 GFR 70 HDL 61 LDL 81     01/05/2022 BUN 18 Cr 0.910 GFR 75 LDL 104 A1c 13.3%    ASSESSMENT / PLAN / RECOMMENDATIONS:   1) Type 1 Diabetes Mellitus, Poorly controlled, With Retinopathic and neuropathic  complications - Most recent A1c of 9.1 %. Goal A1c < 7.0 %.     -Her A1c remains elevated  -She does not like the Dexcom, I would like to remain on the freestyle libre -In  reviewing CGM download, it appears that the patient is with inconsistent intake of insulin, when asked the patient if she is consistent with her insulin intake she did confirm compliance but upon questioning when was the last time she took NPH or what was the dose the patient could not recall -She also tells me that she took NPH yesterday in the morning ?,  Of note, the patient's prescription is to take NPH at night.  Unfortunately it appears that she uses her insulin haphazardly which causes glycemic excursions -I will give the patient an NPH dose to take in the morning and at bedtime -I have again encouraged her to continue to use regular insulin before each meal -I have attempted to switch her to pens but she remained on the vials -I will also reduce her prandial insulin as below  MEDICATIONS:  Continue metformin 500 mg twice daily Change Novolin-N 10 units every morning and 16 units at bedtime Decrease Novolin-R to 6 units 3 times daily before every meal Continue correction factor: Novolin-R (BG -130/50)  EDUCATION / INSTRUCTIONS: BG monitoring instructions: Patient is instructed to check her blood sugars 3 times a day, before meals . Call [facility] if: BG persistently < 70  I reviewed the Rule of 15 for the treatment of hypoglycemia in detail with the patient. Literature supplied.   2) Diabetic complications:  Eye: Does  have known diabetic retinopathy.  Neuro/ Feet: Does  have known diabetic peripheral neuropathy. Renal: Patient does not have known baseline CKD. She is  on an ACEI/ARB at present.   3) Dyslipidemia :   - LDL acceptable  - Discussed low fat diet   Pt on rosuvastatin 20 mg daily   Follow-up in 6 months   Signed electronically by: Lyndle Herrlich, MD  [facility] Endocrinology  [facility] [address] [place] [place], [place] [address] Phone: [phone] FAX: [phone]   CC: Ronal Fear, NP (Inactive) [address] [place] [address] Phone: [phone]  Fax: [phone]    Return to Endocrinology clinic as below: Future Appointments  Date Time Provider Department Center  12/12/2022 11:10 AM Sayvion Vigen, Konrad Dolores, MD [facility] None  02/02/2023 11:00 AM [facility] None  02/02/2023 11:30 AM Serena Croissant, MD [facility] None

## 2022-12-14 DIAGNOSIS — M545 Low back pain, unspecified: Secondary | ICD-10-CM | POA: Diagnosis not present

## 2022-12-14 DIAGNOSIS — R1084 Generalized abdominal pain: Secondary | ICD-10-CM | POA: Diagnosis not present

## 2022-12-14 DIAGNOSIS — M549 Dorsalgia, unspecified: Secondary | ICD-10-CM | POA: Diagnosis not present

## 2022-12-14 DIAGNOSIS — R109 Unspecified abdominal pain: Secondary | ICD-10-CM | POA: Diagnosis not present

## 2022-12-14 DIAGNOSIS — M4316 Spondylolisthesis, lumbar region: Secondary | ICD-10-CM | POA: Diagnosis not present

## 2022-12-14 DIAGNOSIS — M5136 Other intervertebral disc degeneration, lumbar region: Secondary | ICD-10-CM | POA: Diagnosis not present

## 2022-12-14 DIAGNOSIS — M48061 Spinal stenosis, lumbar region without neurogenic claudication: Secondary | ICD-10-CM | POA: Diagnosis not present

## 2022-12-14 DIAGNOSIS — N289 Disorder of kidney and ureter, unspecified: Secondary | ICD-10-CM | POA: Diagnosis not present

## 2022-12-14 DIAGNOSIS — G8929 Other chronic pain: Secondary | ICD-10-CM | POA: Diagnosis not present

## 2022-12-14 DIAGNOSIS — Z9049 Acquired absence of other specified parts of digestive tract: Secondary | ICD-10-CM | POA: Diagnosis not present

## 2022-12-14 DIAGNOSIS — M5459 Other low back pain: Secondary | ICD-10-CM | POA: Diagnosis not present

## 2022-12-17 DIAGNOSIS — W19XXXA Unspecified fall, initial encounter: Secondary | ICD-10-CM | POA: Diagnosis not present

## 2022-12-17 DIAGNOSIS — S0990XA Unspecified injury of head, initial encounter: Secondary | ICD-10-CM | POA: Diagnosis not present

## 2022-12-27 DIAGNOSIS — K353 Acute appendicitis with localized peritonitis, without perforation or gangrene: Secondary | ICD-10-CM | POA: Diagnosis not present

## 2022-12-27 DIAGNOSIS — Z09 Encounter for follow-up examination after completed treatment for conditions other than malignant neoplasm: Secondary | ICD-10-CM | POA: Diagnosis not present

## 2023-01-03 DIAGNOSIS — E11311 Type 2 diabetes mellitus with unspecified diabetic retinopathy with macular edema: Secondary | ICD-10-CM | POA: Diagnosis not present

## 2023-01-03 DIAGNOSIS — E083513 Diabetes mellitus due to underlying condition with proliferative diabetic retinopathy with macular edema, bilateral: Secondary | ICD-10-CM | POA: Diagnosis not present

## 2023-01-03 DIAGNOSIS — Z961 Presence of intraocular lens: Secondary | ICD-10-CM | POA: Diagnosis not present

## 2023-01-03 DIAGNOSIS — E103513 Type 1 diabetes mellitus with proliferative diabetic retinopathy with macular edema, bilateral: Secondary | ICD-10-CM | POA: Diagnosis not present

## 2023-01-05 DIAGNOSIS — M5431 Sciatica, right side: Secondary | ICD-10-CM | POA: Diagnosis not present

## 2023-01-05 DIAGNOSIS — M4316 Spondylolisthesis, lumbar region: Secondary | ICD-10-CM | POA: Diagnosis not present

## 2023-01-05 DIAGNOSIS — M5432 Sciatica, left side: Secondary | ICD-10-CM | POA: Diagnosis not present

## 2023-01-05 DIAGNOSIS — M549 Dorsalgia, unspecified: Secondary | ICD-10-CM | POA: Diagnosis not present

## 2023-01-14 DIAGNOSIS — Z7984 Long term (current) use of oral hypoglycemic drugs: Secondary | ICD-10-CM | POA: Diagnosis not present

## 2023-01-14 DIAGNOSIS — A419 Sepsis, unspecified organism: Secondary | ICD-10-CM | POA: Diagnosis not present

## 2023-01-14 DIAGNOSIS — R000 Tachycardia, unspecified: Secondary | ICD-10-CM | POA: Diagnosis not present

## 2023-01-14 DIAGNOSIS — Z79899 Other long term (current) drug therapy: Secondary | ICD-10-CM | POA: Diagnosis not present

## 2023-01-14 DIAGNOSIS — I10 Essential (primary) hypertension: Secondary | ICD-10-CM | POA: Diagnosis not present

## 2023-01-14 DIAGNOSIS — I63522 Cerebral infarction due to unspecified occlusion or stenosis of left anterior cerebral artery: Secondary | ICD-10-CM | POA: Diagnosis not present

## 2023-01-14 DIAGNOSIS — E1110 Type 2 diabetes mellitus with ketoacidosis without coma: Secondary | ICD-10-CM | POA: Diagnosis not present

## 2023-01-14 DIAGNOSIS — I6782 Cerebral ischemia: Secondary | ICD-10-CM | POA: Diagnosis not present

## 2023-01-14 DIAGNOSIS — N390 Urinary tract infection, site not specified: Secondary | ICD-10-CM | POA: Diagnosis not present

## 2023-01-14 DIAGNOSIS — Z794 Long term (current) use of insulin: Secondary | ICD-10-CM | POA: Diagnosis not present

## 2023-01-14 DIAGNOSIS — Z7982 Long term (current) use of aspirin: Secondary | ICD-10-CM | POA: Diagnosis not present

## 2023-01-14 DIAGNOSIS — R9431 Abnormal electrocardiogram [ECG] [EKG]: Secondary | ICD-10-CM | POA: Diagnosis not present

## 2023-01-14 DIAGNOSIS — E109 Type 1 diabetes mellitus without complications: Secondary | ICD-10-CM | POA: Diagnosis not present

## 2023-01-14 DIAGNOSIS — R531 Weakness: Secondary | ICD-10-CM | POA: Diagnosis not present

## 2023-01-18 ENCOUNTER — Other Ambulatory Visit: Payer: Self-pay | Admitting: Orthopedic Surgery

## 2023-01-18 DIAGNOSIS — M5432 Sciatica, left side: Secondary | ICD-10-CM

## 2023-01-18 DIAGNOSIS — M4316 Spondylolisthesis, lumbar region: Secondary | ICD-10-CM

## 2023-01-18 DIAGNOSIS — M5431 Sciatica, right side: Secondary | ICD-10-CM

## 2023-01-19 ENCOUNTER — Telehealth: Payer: Self-pay

## 2023-01-19 NOTE — Telephone Encounter (Signed)
Transition Care Management Unsuccessful Follow-up Telephone Call  Date of discharge and from where:  [facility] 9/21  Attempts:  1st Attempt  Reason for unsuccessful TCM follow-up call:  No answer/busy   Lenard Forth [place]  [facility], [facility] Guide, Phone: [phone] Website: [email or web address]

## 2023-01-20 ENCOUNTER — Telehealth: Payer: Self-pay

## 2023-01-20 NOTE — Telephone Encounter (Signed)
Transition Care Management Unsuccessful Follow-up Telephone Call  Date of discharge and from where:  [facility] 9/21  Attempts:  2nd Attempt  Reason for unsuccessful TCM follow-up call:  No answer/busy   Lenard Forth [place]  [facility], [facility] Guide, Phone: [phone] Website: [email or web address]

## 2023-02-02 ENCOUNTER — Inpatient Hospital Stay: Payer: Medicare Other | Admitting: Hematology and Oncology

## 2023-02-02 ENCOUNTER — Inpatient Hospital Stay: Payer: Medicare Other

## 2023-02-02 ENCOUNTER — Other Ambulatory Visit: Payer: Self-pay | Admitting: Hematology and Oncology

## 2023-02-02 DIAGNOSIS — D75839 Thrombocytosis, unspecified: Secondary | ICD-10-CM

## 2023-02-02 NOTE — Assessment & Plan Note (Deleted)
Lab review  02/03/2021: Platelets 588, hemoglobin 12.4: WBC 11.2 06/29/2020: Platelets 540, hemoglobin 11.3, white count 10.9 10/30/2020: Platelets 508, hemoglobin 11, WBC 8.6 06/02/2021: WBC 19, platelets 582, ANC 14.4  06/24/2021: WBC 11.2, hemoglobin 12.1, platelets 539, JAK2 and MPN panel: Negative, flow cytometry: No lymphocyte abnormal subsets 02/01/2022: WBC 11.2, hemoglobin 12.1, platelets 539   I discussed with the patient that it is most like related to underlying inflammation as result of sarcoidosis. Return to clinic in 1 year for follow-up with labs

## 2023-02-10 ENCOUNTER — Ambulatory Visit
Admission: RE | Admit: 2023-02-10 | Discharge: 2023-02-10 | Disposition: A | Discharge status: Home / Self Care | Payer: Medicare Other | Source: Ambulatory Visit | Attending: Orthopedic Surgery | Admitting: Orthopedic Surgery

## 2023-02-10 DIAGNOSIS — M47816 Spondylosis without myelopathy or radiculopathy, lumbar region: Secondary | ICD-10-CM | POA: Diagnosis not present

## 2023-02-10 DIAGNOSIS — M4316 Spondylolisthesis, lumbar region: Secondary | ICD-10-CM | POA: Diagnosis not present

## 2023-02-10 DIAGNOSIS — M48061 Spinal stenosis, lumbar region without neurogenic claudication: Secondary | ICD-10-CM | POA: Diagnosis not present

## 2023-02-10 DIAGNOSIS — M5432 Sciatica, left side: Secondary | ICD-10-CM

## 2023-02-10 DIAGNOSIS — M5431 Sciatica, right side: Secondary | ICD-10-CM

## 2023-02-15 DIAGNOSIS — F909 Attention-deficit hyperactivity disorder, unspecified type: Secondary | ICD-10-CM | POA: Diagnosis not present

## 2023-02-15 DIAGNOSIS — F419 Anxiety disorder, unspecified: Secondary | ICD-10-CM | POA: Diagnosis not present

## 2023-02-15 DIAGNOSIS — F329 Major depressive disorder, single episode, unspecified: Secondary | ICD-10-CM | POA: Diagnosis not present

## 2023-02-27 DIAGNOSIS — M19011 Primary osteoarthritis, right shoulder: Secondary | ICD-10-CM | POA: Diagnosis not present

## 2023-02-28 DIAGNOSIS — M4316 Spondylolisthesis, lumbar region: Secondary | ICD-10-CM | POA: Diagnosis not present

## 2023-02-28 DIAGNOSIS — M5432 Sciatica, left side: Secondary | ICD-10-CM | POA: Diagnosis not present

## 2023-02-28 DIAGNOSIS — M48062 Spinal stenosis, lumbar region with neurogenic claudication: Secondary | ICD-10-CM | POA: Diagnosis not present

## 2023-02-28 DIAGNOSIS — M5431 Sciatica, right side: Secondary | ICD-10-CM | POA: Diagnosis not present

## 2023-03-03 DIAGNOSIS — M48062 Spinal stenosis, lumbar region with neurogenic claudication: Secondary | ICD-10-CM | POA: Diagnosis not present

## 2023-03-03 DIAGNOSIS — M4316 Spondylolisthesis, lumbar region: Secondary | ICD-10-CM | POA: Diagnosis not present

## 2023-03-03 DIAGNOSIS — M5432 Sciatica, left side: Secondary | ICD-10-CM | POA: Diagnosis not present

## 2023-03-03 DIAGNOSIS — M5431 Sciatica, right side: Secondary | ICD-10-CM | POA: Diagnosis not present

## 2023-03-03 DIAGNOSIS — M791 Myalgia, unspecified site: Secondary | ICD-10-CM | POA: Diagnosis not present

## 2023-03-07 DIAGNOSIS — F325 Major depressive disorder, single episode, in full remission: Secondary | ICD-10-CM | POA: Diagnosis not present

## 2023-03-07 DIAGNOSIS — F909 Attention-deficit hyperactivity disorder, unspecified type: Secondary | ICD-10-CM | POA: Diagnosis not present

## 2023-03-07 DIAGNOSIS — F419 Anxiety disorder, unspecified: Secondary | ICD-10-CM | POA: Diagnosis not present

## 2023-03-08 ENCOUNTER — Inpatient Hospital Stay: Payer: Medicare Other | Attending: Hematology and Oncology | Admitting: Hematology and Oncology

## 2023-03-08 ENCOUNTER — Inpatient Hospital Stay: Payer: Medicare Other

## 2023-03-08 VITALS — BP 143/74 | HR 102 | Temp 98.5°F | Resp 18 | Ht 61.0 in | Wt 143.9 lb

## 2023-03-08 DIAGNOSIS — Z79899 Other long term (current) drug therapy: Secondary | ICD-10-CM | POA: Diagnosis not present

## 2023-03-08 DIAGNOSIS — D75839 Thrombocytosis, unspecified: Secondary | ICD-10-CM | POA: Diagnosis not present

## 2023-03-08 DIAGNOSIS — D649 Anemia, unspecified: Secondary | ICD-10-CM | POA: Insufficient documentation

## 2023-03-08 DIAGNOSIS — N644 Mastodynia: Secondary | ICD-10-CM

## 2023-03-08 DIAGNOSIS — Z7982 Long term (current) use of aspirin: Secondary | ICD-10-CM | POA: Diagnosis not present

## 2023-03-08 LAB — CBC WITH DIFFERENTIAL (CANCER CENTER ONLY)
Abs Immature Granulocytes: 0.04 10*3/uL (ref 0.00–0.07)
Basophils Absolute: 0 10*3/uL (ref 0.0–0.1)
Basophils Relative: 0 %
Eosinophils Absolute: 0.4 10*3/uL (ref 0.0–0.5)
Eosinophils Relative: 4 %
HCT: 34 % — ABNORMAL LOW (ref 36.0–46.0)
Hemoglobin: 11 g/dL — ABNORMAL LOW (ref 12.0–15.0)
Immature Granulocytes: 0 %
Lymphocytes Relative: 27 %
Lymphs Abs: 2.5 10*3/uL (ref 0.7–4.0)
MCH: 29.5 pg (ref 26.0–34.0)
MCHC: 32.4 g/dL (ref 30.0–36.0)
MCV: 91.2 fL (ref 80.0–100.0)
Monocytes Absolute: 0.7 10*3/uL (ref 0.1–1.0)
Monocytes Relative: 8 %
Neutro Abs: 5.5 10*3/uL (ref 1.7–7.7)
Neutrophils Relative %: 61 %
Platelet Count: 503 10*3/uL — ABNORMAL HIGH (ref 150–400)
RBC: 3.73 MIL/uL — ABNORMAL LOW (ref 3.87–5.11)
RDW: 12.7 % (ref 11.5–15.5)
WBC Count: 9.2 10*3/uL (ref 4.0–10.5)
nRBC: 0 % (ref 0.0–0.2)

## 2023-03-08 NOTE — Progress Notes (Signed)
Patient Care Team: Ronal Fear, NP (Inactive) as PCP - General (Nurse Practitioner) Ronal Fear, NP (Inactive) (Nurse Practitioner)  DIAGNOSIS:  Encounter Diagnoses  Name Primary?   Thrombocytosis Yes   Breast pain     CHIEF COMPLIANT: F/u of thrombocytopenia and anemia  HISTORY OF PRESENT ILLNESS:  History of Present Illness   Jill [name], a patient with a history of fluctuating white blood cell and platelet counts, presents for a routine check-up. She has been monitoring her blood sugar levels, which have been satisfactory. Over the past year and a half, her platelet count has varied from 539 to 456, and currently stands at 503, which is within the acceptable range. Her white blood cell count has normalized, and her hemoglobin levels, which have fluctuated between 10.5 and 12, are currently at 11, indicating mild anemia.  Recently, Jill [name] noticed a lump near her collarbone, which has caused her some concern. However, she reports that it is not painful. In addition, she has been experiencing breast pain for a couple of years. The pain varies from a burning sensation to an achy feeling, as if bruised. She has not been diagnosed with fibromyalgia.  Jill [name] also mentions a history of menopause, which she has been through for about five years. She has not had any recent mammograms but is planning to schedule one due to her ongoing breast pain.         ALLERGIES:  is allergic to pb-hyoscy-atropine-scopolamine, canagliflozin, latex, phenobarbital-belladonna alk, and sulfamethoxazole.  MEDICATIONS:  Current Outpatient Medications  Medication Sig Dispense Refill   albuterol (PROVENTIL HFA;VENTOLIN HFA) 108 (90 Base) MCG/ACT inhaler Inhale 1-2 puffs into the lungs every 6 (six) hours as needed for wheezing or shortness of breath.      amphetamine-dextroamphetamine (ADDERALL) 20 MG tablet Take 20 mg by mouth 2 (two) times daily.      aspirin EC 81 MG tablet Take 81 mg by mouth daily.       buPROPion (WELLBUTRIN SR) 150 MG 12 hr tablet Take 150 mg by mouth 2 (two) times daily.  1   DULoxetine (CYMBALTA) 60 MG capsule Take 60 mg by mouth 2 (two) times daily.   1   fexofenadine (ALLEGRA) 180 MG tablet Take 180 mg by mouth daily.     hydrOXYzine (ATARAX/VISTARIL) 25 MG tablet Take 25 mg by mouth 3 (three) times daily.      Insulin NPH, Human,, Isophane, (NOVOLIN N FLEXPEN) 100 UNIT/ML Kiwkpen Inject 10 Units into the skin daily before breakfast AND 16 Units at bedtime. 30 mL 3   Insulin Pen Needle 32G X 4 MM MISC 1 Device by Does not apply route in the morning, at noon, in the evening, and at bedtime. 400 each 3   Insulin Regular Human (NOVOLIN R FLEXPEN RELION) 100 UNIT/ML KwikPen Max daily 30 units 30 mL 3   lisinopril-hydrochlorothiazide (ZESTORETIC) 20-25 MG tablet Take 2 tablets by mouth at bedtime.     metFORMIN (GLUCOPHAGE-XR) 500 MG 24 hr tablet Take 1 tablet (500 mg total) by mouth 2 (two) times daily with a meal. 180 tablet 3   metoCLOPramide (REGLAN) 10 MG tablet Take 1 tablet (10 mg total) by mouth daily.     Oxcarbazepine (TRILEPTAL) 300 MG tablet Take 1 tablet (300 mg total) by mouth daily.     polyvinyl alcohol (LIQUIFILM TEARS) 1.4 % ophthalmic solution Place 1 drop into both eyes 2 (two) times daily as needed for dry eyes.      rosuvastatin (CRESTOR)  20 MG tablet Take 20 mg by mouth daily.     valACYclovir (VALTREX) 500 MG tablet Take 1 tablet (500 mg total) by mouth 3 (three) times daily. 30 tablet 0   No current facility-administered medications for this visit.    PHYSICAL EXAMINATION: ECOG PERFORMANCE STATUS: 1 - Symptomatic but completely ambulatory  Vitals:   03/08/23 1144  BP: (!) 143/74  Pulse: (!) 102  Resp: 18  Temp: 98.5 F (36.9 C)  SpO2: 99%   Filed Weights   03/08/23 1144  Weight: 143 lb 14.4 oz (65.3 kg)    Physical Exam   MUSCULOSKELETAL: Bony prominence on collarbone, non-tender, normal anatomical variation.      (exam performed in the  presence of a chaperone)  LABORATORY DATA:  I have reviewed the data as listed    Latest Ref Rng & Units 06/29/2020    2:45 PM 06/29/2020    2:00 PM 06/28/2018    9:45 PM  CMP  Glucose 70 - 99 mg/dL 528  413  83   BUN 6 - 20 mg/dL 22  19  27    Creatinine 0.44 - 1.00 mg/dL 2.44  0.10  2.72   Sodium 135 - 145 mmol/L 136  135  134   Potassium 3.5 - 5.1 mmol/L 5.3  5.3  5.1   Chloride 98 - 111 mmol/L 101  99  100   CO2 22 - 32 mmol/L  26  21   Calcium 8.9 - 10.3 mg/dL  9.7  9.7   Total Protein 6.5 - 8.1 g/dL  7.0  7.7   Total Bilirubin 0.3 - 1.2 mg/dL  0.5  0.6   Alkaline Phos 38 - 126 U/L  252  206   AST 15 - 41 U/L  33  36   ALT 0 - 44 U/L  59  53     Lab Results  Component Value Date   WBC 9.2 03/08/2023   HGB 11.0 (L) 03/08/2023   HCT 34.0 (L) 03/08/2023   MCV 91.2 03/08/2023   PLT 503 (H) 03/08/2023   NEUTROABS 5.5 03/08/2023    ASSESSMENT & PLAN:  Thrombocytosis Lab review  02/03/2021: Platelets 588, hemoglobin 12.4: WBC 11.2 06/29/2020: Platelets 540, hemoglobin 11.3, white count 10.9 10/30/2020: Platelets 508, hemoglobin 11, WBC 8.6 06/02/2021: WBC 19, platelets 582, ANC 14.4  06/24/2021: WBC 11.2, hemoglobin 12.1, platelets 539, JAK2 and MPN panel: Negative, flow cytometry: No lymphocyte abnormal subsets 02/01/2022: WBC 11.2, hemoglobin 12.1, platelets 539   I discussed with the patient that it is most like related to underlying inflammation as result of sarcoidosis. I called the patient a couple of times but it went to the voicemail but there was no way we could leave a message because of full mailbox.   Return to clinic in 1 year for labs and follow-up ------------------------------------- Assessment and Plan    Thrombocytosis Platelet count elevated but stable at 503 (previously 539 and 456). No need for bone marrow biopsy or treatment at this level. -Continue monitoring, recheck in one year.  Mild Anemia Hemoglobin fluctuating between 10.5 and 12, currently at 11.  No need for intervention. -Continue monitoring.  Breast Pain Bilateral breast pain described as burning and achy. No prior diagnosis of fibromyalgia. Patient is post-menopausal. -Order diagnostic mammogram. Patient to schedule appointment at her convenience.  Prominent Collarbone Patient noticed a lump on collarbone. Assessed as normal anatomical variation. -Reassured patient, no further action needed.  Orders Placed This Encounter  Procedures   MM DIAG BREAST TOMO BILATERAL    MCR Pf; 05/12/2021@bcg     Standing Status:   Future    Standing Expiration Date:   03/07/2024    Order Specific Question:   Reason for Exam (SYMPTOM  OR DIAGNOSIS REQUIRED)    Answer:   bilateral breast pains    Order Specific Question:   Is the patient pregnant?    Answer:   No    Order Specific Question:   Preferred imaging location?    Answer:   [facility]    Order Specific Question:   Release to patient    Answer:   Immediate   CBC with Differential (Cancer Center Only)    Standing Status:   Future    Standing Expiration Date:   03/07/2024   The patient has a good understanding of the overall plan. she agrees with it. she will call with any problems that may develop before the next visit here. Total time spent: 30 mins including face to face time and time spent for planning, charting and co-ordination of care   Tamsen Meek, MD 03/08/23

## 2023-03-08 NOTE — Assessment & Plan Note (Signed)
Lab review  02/03/2021: Platelets 588, hemoglobin 12.4: WBC 11.2 06/29/2020: Platelets 540, hemoglobin 11.3, white count 10.9 10/30/2020: Platelets 508, hemoglobin 11, WBC 8.6 06/02/2021: WBC 19, platelets 582, ANC 14.4  06/24/2021: WBC 11.2, hemoglobin 12.1, platelets 539, JAK2 and MPN panel: Negative, flow cytometry: No lymphocyte abnormal subsets 02/01/2022: WBC 11.2, hemoglobin 12.1, platelets 539   I discussed with the patient that it is most like related to underlying inflammation as result of sarcoidosis. I called the patient a couple of times but it went to the voicemail but there was no way we could leave a message because of full mailbox.   Return to clinic in 1 year for labs and follow-up

## 2023-04-26 ENCOUNTER — Other Ambulatory Visit: Payer: Self-pay | Admitting: *Deleted

## 2023-04-26 DIAGNOSIS — N644 Mastodynia: Secondary | ICD-10-CM

## 2023-04-26 NOTE — Progress Notes (Signed)
Received call from pt with complaint of bilateral axilla tenderness x several weeks.  Pt denies any recent injury, trauma, redness or swelling at this time.  Pt due for mammogram.  RN reviewed with MD and verbal orders received to obtain diagnostic mammogram as well as bilateral breast US including axilla.  Orders placed, pt state she will contact the breast center for appt.  Pt also states she will reach out to our office once appt is scheduled for f/u with NP to address bilateral axilla discomfort.

## 2023-06-08 DIAGNOSIS — M4316 Spondylolisthesis, lumbar region: Secondary | ICD-10-CM | POA: Diagnosis not present

## 2023-06-08 DIAGNOSIS — M48062 Spinal stenosis, lumbar region with neurogenic claudication: Secondary | ICD-10-CM | POA: Diagnosis not present

## 2023-06-08 DIAGNOSIS — M5416 Radiculopathy, lumbar region: Secondary | ICD-10-CM | POA: Diagnosis not present

## 2023-06-11 DIAGNOSIS — Z79899 Other long term (current) drug therapy: Secondary | ICD-10-CM | POA: Diagnosis not present

## 2023-06-11 DIAGNOSIS — I10 Essential (primary) hypertension: Secondary | ICD-10-CM | POA: Diagnosis not present

## 2023-06-11 DIAGNOSIS — E119 Type 2 diabetes mellitus without complications: Secondary | ICD-10-CM | POA: Diagnosis not present

## 2023-06-11 DIAGNOSIS — R202 Paresthesia of skin: Secondary | ICD-10-CM | POA: Diagnosis not present

## 2023-06-11 DIAGNOSIS — Z7984 Long term (current) use of oral hypoglycemic drugs: Secondary | ICD-10-CM | POA: Diagnosis not present

## 2023-06-11 DIAGNOSIS — R42 Dizziness and giddiness: Secondary | ICD-10-CM | POA: Diagnosis not present

## 2023-06-11 DIAGNOSIS — R9431 Abnormal electrocardiogram [ECG] [EKG]: Secondary | ICD-10-CM | POA: Diagnosis not present

## 2023-06-11 DIAGNOSIS — Z794 Long term (current) use of insulin: Secondary | ICD-10-CM | POA: Diagnosis not present

## 2023-06-13 DIAGNOSIS — E782 Mixed hyperlipidemia: Secondary | ICD-10-CM | POA: Diagnosis not present

## 2023-06-13 DIAGNOSIS — D75839 Thrombocytosis, unspecified: Secondary | ICD-10-CM | POA: Diagnosis not present

## 2023-06-13 DIAGNOSIS — R300 Dysuria: Secondary | ICD-10-CM | POA: Diagnosis not present

## 2023-06-13 DIAGNOSIS — Z23 Encounter for immunization: Secondary | ICD-10-CM | POA: Diagnosis not present

## 2023-06-13 DIAGNOSIS — E1040 Type 1 diabetes mellitus with diabetic neuropathy, unspecified: Secondary | ICD-10-CM | POA: Diagnosis not present

## 2023-06-13 DIAGNOSIS — Z79899 Other long term (current) drug therapy: Secondary | ICD-10-CM | POA: Diagnosis not present

## 2023-06-13 DIAGNOSIS — F988 Other specified behavioral and emotional disorders with onset usually occurring in childhood and adolescence: Secondary | ICD-10-CM | POA: Diagnosis not present

## 2023-06-13 DIAGNOSIS — F339 Major depressive disorder, recurrent, unspecified: Secondary | ICD-10-CM | POA: Diagnosis not present

## 2023-06-13 DIAGNOSIS — F419 Anxiety disorder, unspecified: Secondary | ICD-10-CM | POA: Diagnosis not present

## 2023-06-13 DIAGNOSIS — K3184 Gastroparesis: Secondary | ICD-10-CM | POA: Diagnosis not present

## 2023-06-13 DIAGNOSIS — I10 Essential (primary) hypertension: Secondary | ICD-10-CM | POA: Diagnosis not present

## 2023-06-13 DIAGNOSIS — E1143 Type 2 diabetes mellitus with diabetic autonomic (poly)neuropathy: Secondary | ICD-10-CM | POA: Diagnosis not present

## 2023-06-13 DIAGNOSIS — A6000 Herpesviral infection of urogenital system, unspecified: Secondary | ICD-10-CM | POA: Diagnosis not present

## 2023-06-14 DIAGNOSIS — L281 Prurigo nodularis: Secondary | ICD-10-CM | POA: Diagnosis not present

## 2023-06-14 DIAGNOSIS — L209 Atopic dermatitis, unspecified: Secondary | ICD-10-CM | POA: Diagnosis not present

## 2023-06-16 DIAGNOSIS — R600 Localized edema: Secondary | ICD-10-CM | POA: Diagnosis not present

## 2023-06-27 DIAGNOSIS — M48062 Spinal stenosis, lumbar region with neurogenic claudication: Secondary | ICD-10-CM | POA: Diagnosis not present

## 2023-07-12 DIAGNOSIS — E785 Hyperlipidemia, unspecified: Secondary | ICD-10-CM | POA: Diagnosis not present

## 2023-07-12 DIAGNOSIS — I639 Cerebral infarction, unspecified: Secondary | ICD-10-CM | POA: Diagnosis not present

## 2023-07-12 DIAGNOSIS — E1122 Type 2 diabetes mellitus with diabetic chronic kidney disease: Secondary | ICD-10-CM | POA: Diagnosis not present

## 2023-07-12 DIAGNOSIS — E1143 Type 2 diabetes mellitus with diabetic autonomic (poly)neuropathy: Secondary | ICD-10-CM | POA: Diagnosis not present

## 2023-07-12 DIAGNOSIS — H532 Diplopia: Secondary | ICD-10-CM | POA: Diagnosis not present

## 2023-07-12 DIAGNOSIS — Z961 Presence of intraocular lens: Secondary | ICD-10-CM | POA: Diagnosis not present

## 2023-07-12 DIAGNOSIS — R42 Dizziness and giddiness: Secondary | ICD-10-CM | POA: Diagnosis not present

## 2023-07-12 DIAGNOSIS — E113513 Type 2 diabetes mellitus with proliferative diabetic retinopathy with macular edema, bilateral: Secondary | ICD-10-CM | POA: Diagnosis not present

## 2023-07-12 DIAGNOSIS — G459 Transient cerebral ischemic attack, unspecified: Secondary | ICD-10-CM | POA: Diagnosis not present

## 2023-07-12 DIAGNOSIS — I129 Hypertensive chronic kidney disease with stage 1 through stage 4 chronic kidney disease, or unspecified chronic kidney disease: Secondary | ICD-10-CM | POA: Diagnosis not present

## 2023-07-12 DIAGNOSIS — H40053 Ocular hypertension, bilateral: Secondary | ICD-10-CM | POA: Diagnosis not present

## 2023-07-12 DIAGNOSIS — H3582 Retinal ischemia: Secondary | ICD-10-CM | POA: Diagnosis not present

## 2023-07-12 DIAGNOSIS — E083513 Diabetes mellitus due to underlying condition with proliferative diabetic retinopathy with macular edema, bilateral: Secondary | ICD-10-CM | POA: Diagnosis not present

## 2023-07-14 DIAGNOSIS — I69398 Other sequelae of cerebral infarction: Secondary | ICD-10-CM | POA: Diagnosis not present

## 2023-07-14 DIAGNOSIS — R2689 Other abnormalities of gait and mobility: Secondary | ICD-10-CM | POA: Diagnosis not present

## 2023-07-14 DIAGNOSIS — H532 Diplopia: Secondary | ICD-10-CM | POA: Diagnosis not present

## 2023-07-14 DIAGNOSIS — R531 Weakness: Secondary | ICD-10-CM | POA: Diagnosis not present

## 2023-07-14 DIAGNOSIS — I10 Essential (primary) hypertension: Secondary | ICD-10-CM | POA: Diagnosis not present

## 2023-07-18 DIAGNOSIS — M48062 Spinal stenosis, lumbar region with neurogenic claudication: Secondary | ICD-10-CM | POA: Diagnosis not present

## 2023-07-20 DIAGNOSIS — Z87828 Personal history of other (healed) physical injury and trauma: Secondary | ICD-10-CM | POA: Diagnosis not present

## 2023-07-20 DIAGNOSIS — M216X2 Other acquired deformities of left foot: Secondary | ICD-10-CM | POA: Diagnosis not present

## 2023-07-20 DIAGNOSIS — R269 Unspecified abnormalities of gait and mobility: Secondary | ICD-10-CM | POA: Diagnosis not present

## 2023-07-24 DIAGNOSIS — I69398 Other sequelae of cerebral infarction: Secondary | ICD-10-CM | POA: Diagnosis not present

## 2023-07-24 DIAGNOSIS — R2689 Other abnormalities of gait and mobility: Secondary | ICD-10-CM | POA: Diagnosis not present

## 2023-07-24 DIAGNOSIS — I10 Essential (primary) hypertension: Secondary | ICD-10-CM | POA: Diagnosis not present

## 2023-07-24 DIAGNOSIS — H532 Diplopia: Secondary | ICD-10-CM | POA: Diagnosis not present

## 2023-07-24 DIAGNOSIS — R531 Weakness: Secondary | ICD-10-CM | POA: Diagnosis not present

## 2023-07-26 DIAGNOSIS — I10 Essential (primary) hypertension: Secondary | ICD-10-CM | POA: Diagnosis not present

## 2023-07-26 DIAGNOSIS — R2689 Other abnormalities of gait and mobility: Secondary | ICD-10-CM | POA: Diagnosis not present

## 2023-07-26 DIAGNOSIS — H532 Diplopia: Secondary | ICD-10-CM | POA: Diagnosis not present

## 2023-07-26 DIAGNOSIS — R531 Weakness: Secondary | ICD-10-CM | POA: Diagnosis not present

## 2023-07-26 DIAGNOSIS — I69398 Other sequelae of cerebral infarction: Secondary | ICD-10-CM | POA: Diagnosis not present

## 2023-07-31 DIAGNOSIS — H532 Diplopia: Secondary | ICD-10-CM | POA: Diagnosis not present

## 2023-07-31 DIAGNOSIS — E083513 Diabetes mellitus due to underlying condition with proliferative diabetic retinopathy with macular edema, bilateral: Secondary | ICD-10-CM | POA: Diagnosis not present

## 2023-08-03 DIAGNOSIS — I10 Essential (primary) hypertension: Secondary | ICD-10-CM | POA: Diagnosis not present

## 2023-08-03 DIAGNOSIS — R930 Abnormal findings on diagnostic imaging of skull and head, not elsewhere classified: Secondary | ICD-10-CM | POA: Diagnosis not present

## 2023-08-03 DIAGNOSIS — I639 Cerebral infarction, unspecified: Secondary | ICD-10-CM | POA: Diagnosis not present

## 2023-08-10 DIAGNOSIS — R269 Unspecified abnormalities of gait and mobility: Secondary | ICD-10-CM | POA: Diagnosis not present

## 2023-08-10 DIAGNOSIS — I10 Essential (primary) hypertension: Secondary | ICD-10-CM | POA: Diagnosis not present

## 2023-08-10 DIAGNOSIS — I639 Cerebral infarction, unspecified: Secondary | ICD-10-CM | POA: Diagnosis not present

## 2023-08-10 DIAGNOSIS — H532 Diplopia: Secondary | ICD-10-CM | POA: Diagnosis not present

## 2023-08-10 DIAGNOSIS — I69398 Other sequelae of cerebral infarction: Secondary | ICD-10-CM | POA: Diagnosis not present

## 2023-08-10 DIAGNOSIS — R2689 Other abnormalities of gait and mobility: Secondary | ICD-10-CM | POA: Diagnosis not present

## 2023-08-10 DIAGNOSIS — R531 Weakness: Secondary | ICD-10-CM | POA: Diagnosis not present

## 2023-08-17 DIAGNOSIS — R269 Unspecified abnormalities of gait and mobility: Secondary | ICD-10-CM | POA: Diagnosis not present

## 2023-08-17 DIAGNOSIS — I69398 Other sequelae of cerebral infarction: Secondary | ICD-10-CM | POA: Diagnosis not present

## 2023-08-17 DIAGNOSIS — I10 Essential (primary) hypertension: Secondary | ICD-10-CM | POA: Diagnosis not present

## 2023-08-17 DIAGNOSIS — R2689 Other abnormalities of gait and mobility: Secondary | ICD-10-CM | POA: Diagnosis not present

## 2023-08-17 DIAGNOSIS — H532 Diplopia: Secondary | ICD-10-CM | POA: Diagnosis not present

## 2023-08-17 DIAGNOSIS — R531 Weakness: Secondary | ICD-10-CM | POA: Diagnosis not present

## 2023-08-18 DIAGNOSIS — Z8673 Personal history of transient ischemic attack (TIA), and cerebral infarction without residual deficits: Secondary | ICD-10-CM | POA: Diagnosis not present

## 2023-08-18 DIAGNOSIS — I6782 Cerebral ischemia: Secondary | ICD-10-CM | POA: Diagnosis not present

## 2023-08-24 DIAGNOSIS — R4182 Altered mental status, unspecified: Secondary | ICD-10-CM | POA: Diagnosis not present

## 2023-08-24 DIAGNOSIS — I6523 Occlusion and stenosis of bilateral carotid arteries: Secondary | ICD-10-CM | POA: Diagnosis not present

## 2023-08-24 DIAGNOSIS — R42 Dizziness and giddiness: Secondary | ICD-10-CM | POA: Diagnosis not present

## 2023-08-24 DIAGNOSIS — G9389 Other specified disorders of brain: Secondary | ICD-10-CM | POA: Diagnosis not present

## 2023-08-24 DIAGNOSIS — R112 Nausea with vomiting, unspecified: Secondary | ICD-10-CM | POA: Diagnosis not present

## 2023-08-29 ENCOUNTER — Telehealth: Payer: Self-pay

## 2023-08-29 MED ORDER — METFORMIN HCL 500 MG/5ML PO SOLN: 5.0000 mL | Freq: Two times a day (BID) | ORAL | 11 refills | Status: DC

## 2023-08-29 NOTE — Telephone Encounter (Signed)
 Patient aware.

## 2023-08-29 NOTE — Telephone Encounter (Signed)
 Patient would like the liquid form of Metformin  as she is unable to swallow the pills.

## 2023-08-30 ENCOUNTER — Ambulatory Visit (INDEPENDENT_AMBULATORY_CARE_PROVIDER_SITE_OTHER): Admitting: Internal Medicine

## 2023-08-30 ENCOUNTER — Encounter: Payer: Self-pay | Admitting: Internal Medicine

## 2023-08-30 VITALS — BP 110/66 | HR 99 | Ht 61.0 in | Wt 143.0 lb

## 2023-08-30 DIAGNOSIS — E1059 Type 1 diabetes mellitus with other circulatory complications: Secondary | ICD-10-CM

## 2023-08-30 DIAGNOSIS — E1042 Type 1 diabetes mellitus with diabetic polyneuropathy: Secondary | ICD-10-CM | POA: Diagnosis not present

## 2023-08-30 DIAGNOSIS — E1065 Type 1 diabetes mellitus with hyperglycemia: Secondary | ICD-10-CM

## 2023-08-30 DIAGNOSIS — E103593 Type 1 diabetes mellitus with proliferative diabetic retinopathy without macular edema, bilateral: Secondary | ICD-10-CM | POA: Diagnosis not present

## 2023-08-30 DIAGNOSIS — E109 Type 1 diabetes mellitus without complications: Secondary | ICD-10-CM | POA: Insufficient documentation

## 2023-08-30 DIAGNOSIS — E785 Hyperlipidemia, unspecified: Secondary | ICD-10-CM | POA: Diagnosis not present

## 2023-08-30 LAB — POCT GLUCOSE (DEVICE FOR HOME USE): Glucose Fasting, POC: 348 mg/dL — AB (ref 70–99)

## 2023-08-30 LAB — POCT GLYCOSYLATED HEMOGLOBIN (HGB A1C): Hemoglobin A1C: 10.8 % — AB (ref 4.0–5.6)

## 2023-08-30 MED ORDER — TRESIBA FLEXTOUCH 100 UNIT/ML ~~LOC~~ SOPN: 30.0000 [IU] | PEN_INJECTOR | Freq: Every day | SUBCUTANEOUS | 3 refills | Status: DC

## 2023-08-30 MED ORDER — INSULIN PEN NEEDLE 32G X 4 MM MISC: 1.0000 | Freq: Four times a day (QID) | 3 refills | Status: AC

## 2023-08-30 NOTE — Patient Instructions (Signed)
 Take metformin  5 mL twice daily Change  Novolin  -N (NPH) to [place] (or any other long-acting insulin ) 30 units ONCE daily Take Novolin -R ( regular) 8 units, half an hour before each meal Novolon-R  correctional insulin : ADD extra units on insulin  to your meal-time Novolin -R  dose if your blood sugars are higher than 180. Use the scale below to help guide you BEFORE each meal   Blood sugar before meal Number of units to inject  Less than 180 0 unit  180 -  230 1 units  231 -  280 2 units  281 -  330 3 units  331 -  380 4 units  381 -  430 5 units  431 -  480 6 units  481 -  530 7 units      HOW TO TREAT LOW BLOOD SUGARS (Blood sugar LESS THAN 70 MG/DL) Please follow the RULE OF 15 for the treatment of hypoglycemia treatment (when your (blood sugars are less than 70 mg/dL)   STEP 1: Take 15 grams of carbohydrates when your blood sugar is low, which includes:  3-4 GLUCOSE TABS  OR 3-4 OZ OF JUICE OR REGULAR SODA OR ONE TUBE OF GLUCOSE GEL    STEP 2: RECHECK blood sugar in 15 MINUTES STEP 3: If your blood sugar is still low at the 15 minute recheck --> then, go back to STEP 1 and treat AGAIN with another 15 grams of carbohydrates.

## 2023-08-30 NOTE — Progress Notes (Signed)
 Name: Jill [name]  MRN/ DOB: [id number], [birth year]   Age/ Sex: 58 y.o., female    PCP: Jill Motts, NP (Inactive)   Reason for Endocrinology Evaluation: Type 1 Diabetes Mellitus     Date of Initial Endocrinology Visit: 02/04/2022    PATIENT IDENTIFIER: Jill [name] is a 58 y.o. female with a past medical history of HTN,sarcoidosis, DM and dyslipidemia. The patient presented for initial endocrinology clinic visit on 02/04/2022 for consultative assistance with her diabetes management.    HPI: Jill [name] was    Diagnosed with DM at age 67 Prior Medications tried/Intolerance: Semlyn cost-prohibited  Hemoglobin A1c has ranged from 7.3% in 2008, peaking at 13.3% in 2023.  On her initial visit to our clinic she had an A1c of 13.3%, she was unclear whether she was diagnosed with type I versus type 2 diabetes, but phenotypically she was more towards type I.  She was on metformin , and insulin  mix.  We decreased metformin  by 50% due to vomiting that she attributed to metformin , we started her on NPH and regular insulin  as well as correction scale, due to insurance issues, insulin  analogs were cost prohibitive, hence we kept her on NPH and regular   She contacted our office 08/2023 requesting switching metformin  tablets to liquid  SUBJECTIVE:   During the last visit (06/08/2022): A1c 9.1%  Today (08/30/23): Jill [name] is here for follow-up on diabetes management.  She has NOT been to our clinic in 15 months. She has not been using the freestyle [place] , as it created skin reaction.  In the past she has not liked the Dexcom  She is accompanied by her spouse Jill [name]     She was evaluated by podiatry 07/16/2023  The patient presented to the ED a few times for evaluation of dizziness, most recent CT of the head was unrevealing except for generalized atrophy 07/2023  CT imaging early May did show evidence of Lacunar infarct, she does follow-up with  neurology  She has been undergoing PT for gait disturbance She has nausea and vomiting due to vertigo  No constipation or diarrhea      HOME DIABETES REGIMEN: Metformin  500 mg liquid 5 mL BID    Novolin -N 14 units QAM and 14 units at bedtime Novolin -R 8 units 3 times daily before every meal Correction scale: Regular (BG -130/50)    Statin: yes ACE-I/ARB: yes    CONTINUOUS GLUCOSE MONITORING RECORD INTERPRETATION  :     DIABETIC COMPLICATIONS: Microvascular complications:  DR with macular edema stable ( legally blind) , neuropathy Denies: CKD Last eye exam: Completed 11/29/2022  Macrovascular complications:  CVA Denies: CAD, PVD   PAST HISTORY: Past Medical History:  Past Medical History:  Diagnosis Date   ADHD    Arthralgia    Depression    Diabetes mellitus without complication (HCC)    Diabetic retinopathy (HCC)    Glaucoma    Hyperlipidemia    Hypertension    Macular edema    Visual impairment    Past Surgical History:  Past Surgical History:  Procedure Laterality Date   CATARACT EXTRACTION     left eye   CESAREAN SECTION     left foot surgery      Social History:  reports that she has never smoked. She has never used smokeless tobacco. She reports that she does not currently use alcohol. She reports that she does not currently use drugs. Family History:  Family History  Problem Relation Age of  Onset   Stroke Father    Stroke Paternal Grandfather      HOME MEDICATIONS: Allergies as of 08/30/2023       Reactions   Hyoscyamine Sulfate Hives   Phenobarbital Hives   Scopolamine Hives   Pb-hyoscy-atropine-scopolamine    Canagliflozin Rash   Latex Rash   Blistery rash   Phenobarbital-belladonna Alk Rash   Sulfamethoxazole Itching        Medication List        Accurate as of August 30, 2023  8:37 AM. If you have any questions, ask your nurse or doctor.          albuterol 108 (90 Base) MCG/ACT inhaler Commonly known as: VENTOLIN  HFA Inhale 1-2 puffs into the lungs every 6 (six) hours as needed for wheezing or shortness of breath.   amoxicillin-clavulanate 875-125 MG tablet Commonly known as: AUGMENTIN Take 1 tablet by mouth.   amphetamine-dextroamphetamine 20 MG tablet Commonly known as: ADDERALL Take 20 mg by mouth 2 (two) times daily.   artificial tears ophthalmic solution Place 1 drop into both eyes 2 (two) times daily as needed for dry eyes.   aspirin EC 81 MG tablet Take 81 mg by mouth daily.   Azelastine HCl 137 MCG/SPRAY Soln Place 1 spray into the nose.   buPROPion 150 MG 12 hr tablet Commonly known as: WELLBUTRIN SR Take 150 mg by mouth 2 (two) times daily.   buPROPion 300 MG 24 hr tablet Commonly known as: WELLBUTRIN XL Take 300 mg by mouth every morning.   diclofenac 75 MG EC tablet Commonly known as: VOLTAREN Take 75 mg by mouth.   DULoxetine 60 MG capsule Commonly known as: CYMBALTA Take 60 mg by mouth 2 (two) times daily.   fexofenadine 180 MG tablet Commonly known as: ALLEGRA Take 180 mg by mouth daily.   hydrOXYzine 25 MG tablet Commonly known as: ATARAX Take 25 mg by mouth 3 (three) times daily.   Insulin  Pen Needle 32G X 4 MM Misc 1 Device by Does not apply route in the morning, at noon, in the evening, and at bedtime.   lisinopril-hydrochlorothiazide 20-25 MG tablet Commonly known as: ZESTORETIC Take 2 tablets by mouth at bedtime.   lisinopril-hydrochlorothiazide 20-12.5 MG tablet Commonly known as: ZESTORETIC Take 2 tablets by mouth daily.   meclizine 25 MG tablet Commonly known as: ANTIVERT Take 25 mg by mouth every 6 (six) hours as needed.   Metformin  HCl 500 MG/5ML Soln Take 5 mLs (500 mg total) by mouth in the morning and at bedtime.   metoCLOPramide  10 MG tablet Commonly known as: REGLAN  Take 1 tablet (10 mg total) by mouth daily.   metoCLOPramide  5 MG tablet Commonly known as: REGLAN  Take by mouth.   NovoLIN  N FlexPen 100 UNIT/ML FlexPen Generic  drug: Insulin  NPH (Human) ([place]) Inject 10 Units into the skin daily before breakfast AND 16 Units at bedtime. What changed: See the new instructions.   NovoLIN  R FlexPen ReliOn 100 UNIT/ML FlexPen Generic drug: Insulin  Regular Human Max daily 30 units What changed: additional instructions   omeprazole 40 MG capsule Commonly known as: PRILOSEC Take 80 mg by mouth.   Oxcarbazepine  300 MG tablet Commonly known as: Trileptal  Take 1 tablet (300 mg total) by mouth daily.   pregabalin 75 MG capsule Commonly known as: LYRICA Take 75 mg by mouth.   promethazine  6.25 MG/5ML solution Commonly known as: PHENERGAN  Take 25 mg by mouth.   rosuvastatin 20 MG tablet Commonly known as: CRESTOR  Take 20 mg by mouth daily.   valACYclovir  500 MG tablet Commonly known as: VALTREX  Take 1 tablet (500 mg total) by mouth 3 (three) times daily.         ALLERGIES: Allergies  Allergen Reactions   Hyoscyamine Sulfate Hives   Phenobarbital Hives   Scopolamine Hives   Pb-Hyoscy-Atropine-Scopolamine    Canagliflozin Rash   Latex Rash    Blistery rash   Phenobarbital-Belladonna Alk Rash   Sulfamethoxazole Itching     REVIEW OF SYSTEMS: A comprehensive ROS was conducted with the patient and is negative except as per HPI   OBJECTIVE:   VITAL SIGNS: BP 110/66 (BP Location: Left Arm, Patient Position: Sitting, Cuff Size: Normal)   Pulse 99   Ht 5\' 1"  (1.549 m)   Wt 143 lb (64.9 kg)   SpO2 (!) 71%   BMI 27.02 kg/m    PHYSICAL EXAM:  General: Pt appears well and is in NAD  Lungs: Clear with good BS bilat with no rales, rhonchi, or wheezes  Heart: RRR   Extremities:  Lower extremities - No pretibial edema  Neuro: MS is good with appropriate affect, pt is alert and Ox3   DM Foot Exam 07/20/2023 per podiatry    DATA REVIEWED:  Lab Results  Component Value Date   HGBA1C 10.8 (A) 08/30/2023   HGBA1C 9.1 (A) 06/08/2022   HGBA1C 9.1 (A) 04/11/2022   08/24/2023@[facility]   Everywhere  BUN 27 CR 1.23 GFR 57  07/12/2023  A1c 10.5%  Old records , labs and images have been reviewed.    ASSESSMENT / PLAN / RECOMMENDATIONS:   1) Type 1 Diabetes Mellitus, Poorly controlled, With Retinopathic, neuropathic and macrovascular complications - Most recent A1c of 10.8%. Goal A1c < 7.0 %.     -Poorly controlled diabetes, due to imperfect adherence to medication.  -Barriers to diabetes care is cognitive impairment, during the visit today the patient has been dependent on spouse to verify insulin  doses, which he was not very clear on as well -The patient did not take NPH last night, she had a fasting BG this morning of 545 Mg/DL, she took 8 units of regular insulin  plus correction scale.  BG in the office 348 Mg/DL -She does not like the Dexcom - I have attempted to switch her to pens in the past, but she preferred vials, today she is on insulin  pens.  I have recommended switching NPH to insulin  analog to simplify her regimen  MEDICATIONS:  Continue metformin  500 mg/5 mL, 5 mL BID   Change NPH to Tresiba 30 units daily Continue Novolin -R to 8 units 3 times daily before every meal Continue correction factor: Novolin -R (BG -130/50)  EDUCATION / INSTRUCTIONS: BG monitoring instructions: Patient is instructed to check her blood sugars 3 times a day, before meals . Call [facility] if: BG persistently < 70  I reviewed the Rule of 15 for the treatment of hypoglycemia in detail with the patient. Literature supplied.   2) Diabetic complications:  Eye: Does  have known diabetic retinopathy.  Neuro/ Feet: Does  have known diabetic peripheral neuropathy. Renal: Patient does not have known baseline CKD. She is  on an ACEI/ARB at present.   3) Dyslipidemia :   - Historically LDL acceptable in the past, we will continue to monitor Pt on rosuvastatin 20 mg daily   Follow-up in 3 months   I spent 32 minutes preparing to see the patient by review of  recent labs, imaging and procedures, obtaining  and reviewing separately obtained history, communicating with the patient/family or caregiver, ordering medications, tests or procedures, and documenting clinical information in the EHR including the differential Dx, treatment, and any further evaluation and other management    Signed electronically by: Natale Bail, MD  [facility] Endocrinology  [facility] [address] [place] [place], [place] [address] Phone: [phone] FAX: [phone]   CC: Jill Motts, NP (Inactive) [address] [place] [address] Phone: [phone]  Fax: [phone]    Return to Endocrinology clinic as below: Future Appointments  Date Time Provider Department Center  03/11/2024 11:45 AM Cameron Cea, MD [facility] None

## 2023-09-01 DIAGNOSIS — G9340 Encephalopathy, unspecified: Secondary | ICD-10-CM | POA: Diagnosis not present

## 2023-09-01 DIAGNOSIS — R1110 Vomiting, unspecified: Secondary | ICD-10-CM | POA: Diagnosis not present

## 2023-09-01 DIAGNOSIS — E1069 Type 1 diabetes mellitus with other specified complication: Secondary | ICD-10-CM | POA: Diagnosis not present

## 2023-09-01 DIAGNOSIS — E1040 Type 1 diabetes mellitus with diabetic neuropathy, unspecified: Secondary | ICD-10-CM | POA: Diagnosis not present

## 2023-09-01 DIAGNOSIS — Z794 Long term (current) use of insulin: Secondary | ICD-10-CM | POA: Diagnosis not present

## 2023-09-01 DIAGNOSIS — K3184 Gastroparesis: Secondary | ICD-10-CM | POA: Diagnosis not present

## 2023-09-01 DIAGNOSIS — I129 Hypertensive chronic kidney disease with stage 1 through stage 4 chronic kidney disease, or unspecified chronic kidney disease: Secondary | ICD-10-CM | POA: Diagnosis not present

## 2023-09-01 DIAGNOSIS — E1043 Type 1 diabetes mellitus with diabetic autonomic (poly)neuropathy: Secondary | ICD-10-CM | POA: Diagnosis not present

## 2023-09-01 DIAGNOSIS — R2681 Unsteadiness on feet: Secondary | ICD-10-CM | POA: Diagnosis not present

## 2023-09-01 DIAGNOSIS — N1831 Chronic kidney disease, stage 3a: Secondary | ICD-10-CM | POA: Diagnosis not present

## 2023-09-01 DIAGNOSIS — E872 Acidosis, unspecified: Secondary | ICD-10-CM | POA: Diagnosis present

## 2023-09-01 DIAGNOSIS — F339 Major depressive disorder, recurrent, unspecified: Secondary | ICD-10-CM | POA: Diagnosis not present

## 2023-09-01 DIAGNOSIS — E10319 Type 1 diabetes mellitus with unspecified diabetic retinopathy without macular edema: Secondary | ICD-10-CM | POA: Diagnosis not present

## 2023-09-01 DIAGNOSIS — K5909 Other constipation: Secondary | ICD-10-CM | POA: Diagnosis not present

## 2023-09-01 DIAGNOSIS — I959 Hypotension, unspecified: Secondary | ICD-10-CM | POA: Diagnosis not present

## 2023-09-01 DIAGNOSIS — E876 Hypokalemia: Secondary | ICD-10-CM | POA: Diagnosis not present

## 2023-09-01 DIAGNOSIS — I6523 Occlusion and stenosis of bilateral carotid arteries: Secondary | ICD-10-CM | POA: Diagnosis not present

## 2023-09-01 DIAGNOSIS — E103593 Type 1 diabetes mellitus with proliferative diabetic retinopathy without macular edema, bilateral: Secondary | ICD-10-CM | POA: Diagnosis not present

## 2023-09-01 DIAGNOSIS — D638 Anemia in other chronic diseases classified elsewhere: Secondary | ICD-10-CM | POA: Diagnosis not present

## 2023-09-01 DIAGNOSIS — E1143 Type 2 diabetes mellitus with diabetic autonomic (poly)neuropathy: Secondary | ICD-10-CM | POA: Diagnosis not present

## 2023-09-01 DIAGNOSIS — E1122 Type 2 diabetes mellitus with diabetic chronic kidney disease: Secondary | ICD-10-CM | POA: Diagnosis not present

## 2023-09-01 DIAGNOSIS — R262 Difficulty in walking, not elsewhere classified: Secondary | ICD-10-CM | POA: Diagnosis not present

## 2023-09-01 DIAGNOSIS — E785 Hyperlipidemia, unspecified: Secondary | ICD-10-CM | POA: Diagnosis not present

## 2023-09-01 DIAGNOSIS — M6281 Muscle weakness (generalized): Secondary | ICD-10-CM | POA: Diagnosis not present

## 2023-09-01 DIAGNOSIS — G928 Other toxic encephalopathy: Secondary | ICD-10-CM | POA: Diagnosis not present

## 2023-09-01 DIAGNOSIS — F411 Generalized anxiety disorder: Secondary | ICD-10-CM | POA: Diagnosis not present

## 2023-09-01 DIAGNOSIS — R6520 Severe sepsis without septic shock: Secondary | ICD-10-CM | POA: Diagnosis not present

## 2023-09-01 DIAGNOSIS — K529 Noninfective gastroenteritis and colitis, unspecified: Secondary | ICD-10-CM | POA: Diagnosis not present

## 2023-09-01 DIAGNOSIS — R42 Dizziness and giddiness: Secondary | ICD-10-CM | POA: Diagnosis not present

## 2023-09-01 DIAGNOSIS — G9341 Metabolic encephalopathy: Secondary | ICD-10-CM | POA: Diagnosis not present

## 2023-09-01 DIAGNOSIS — N179 Acute kidney failure, unspecified: Secondary | ICD-10-CM | POA: Diagnosis not present

## 2023-09-01 DIAGNOSIS — K6389 Other specified diseases of intestine: Secondary | ICD-10-CM | POA: Diagnosis not present

## 2023-09-01 DIAGNOSIS — R519 Headache, unspecified: Secondary | ICD-10-CM | POA: Diagnosis not present

## 2023-09-01 DIAGNOSIS — H409 Unspecified glaucoma: Secondary | ICD-10-CM | POA: Diagnosis not present

## 2023-09-01 DIAGNOSIS — R112 Nausea with vomiting, unspecified: Secondary | ICD-10-CM | POA: Diagnosis not present

## 2023-09-01 DIAGNOSIS — E8721 Acute metabolic acidosis: Secondary | ICD-10-CM | POA: Diagnosis not present

## 2023-09-01 DIAGNOSIS — N183 Chronic kidney disease, stage 3 unspecified: Secondary | ICD-10-CM | POA: Diagnosis not present

## 2023-09-01 DIAGNOSIS — R935 Abnormal findings on diagnostic imaging of other abdominal regions, including retroperitoneum: Secondary | ICD-10-CM | POA: Diagnosis not present

## 2023-09-01 DIAGNOSIS — G4730 Sleep apnea, unspecified: Secondary | ICD-10-CM | POA: Diagnosis not present

## 2023-09-01 DIAGNOSIS — Z8673 Personal history of transient ischemic attack (TIA), and cerebral infarction without residual deficits: Secondary | ICD-10-CM | POA: Diagnosis not present

## 2023-09-01 DIAGNOSIS — K7689 Other specified diseases of liver: Secondary | ICD-10-CM | POA: Diagnosis not present

## 2023-09-01 DIAGNOSIS — E1065 Type 1 diabetes mellitus with hyperglycemia: Secondary | ICD-10-CM | POA: Diagnosis not present

## 2023-09-01 DIAGNOSIS — K51018 Ulcerative (chronic) pancolitis with other complication: Secondary | ICD-10-CM | POA: Diagnosis not present

## 2023-09-01 DIAGNOSIS — R197 Diarrhea, unspecified: Secondary | ICD-10-CM | POA: Diagnosis not present

## 2023-09-01 DIAGNOSIS — R509 Fever, unspecified: Secondary | ICD-10-CM | POA: Diagnosis not present

## 2023-09-01 DIAGNOSIS — K5900 Constipation, unspecified: Secondary | ICD-10-CM | POA: Diagnosis not present

## 2023-09-01 DIAGNOSIS — E860 Dehydration: Secondary | ICD-10-CM | POA: Diagnosis not present

## 2023-09-01 DIAGNOSIS — R1084 Generalized abdominal pain: Secondary | ICD-10-CM | POA: Diagnosis not present

## 2023-09-01 DIAGNOSIS — R1111 Vomiting without nausea: Secondary | ICD-10-CM | POA: Diagnosis not present

## 2023-09-01 DIAGNOSIS — N3000 Acute cystitis without hematuria: Secondary | ICD-10-CM | POA: Diagnosis not present

## 2023-09-01 DIAGNOSIS — E1022 Type 1 diabetes mellitus with diabetic chronic kidney disease: Secondary | ICD-10-CM | POA: Diagnosis not present

## 2023-09-01 DIAGNOSIS — E11319 Type 2 diabetes mellitus with unspecified diabetic retinopathy without macular edema: Secondary | ICD-10-CM | POA: Diagnosis not present

## 2023-09-01 DIAGNOSIS — A419 Sepsis, unspecified organism: Secondary | ICD-10-CM | POA: Diagnosis present

## 2023-09-01 DIAGNOSIS — R231 Pallor: Secondary | ICD-10-CM | POA: Diagnosis not present

## 2023-09-01 DIAGNOSIS — E875 Hyperkalemia: Secondary | ICD-10-CM | POA: Diagnosis not present

## 2023-09-01 DIAGNOSIS — S0990XA Unspecified injury of head, initial encounter: Secondary | ICD-10-CM | POA: Diagnosis not present

## 2023-09-01 DIAGNOSIS — E113512 Type 2 diabetes mellitus with proliferative diabetic retinopathy with macular edema, left eye: Secondary | ICD-10-CM | POA: Diagnosis not present

## 2023-09-01 DIAGNOSIS — E871 Hypo-osmolality and hyponatremia: Secondary | ICD-10-CM | POA: Diagnosis not present

## 2023-09-01 DIAGNOSIS — F331 Major depressive disorder, recurrent, moderate: Secondary | ICD-10-CM | POA: Diagnosis not present

## 2023-09-01 DIAGNOSIS — M542 Cervicalgia: Secondary | ICD-10-CM | POA: Diagnosis not present

## 2023-09-01 DIAGNOSIS — K5289 Other specified noninfective gastroenteritis and colitis: Secondary | ICD-10-CM | POA: Diagnosis not present

## 2023-09-02 DIAGNOSIS — N179 Acute kidney failure, unspecified: Secondary | ICD-10-CM | POA: Diagnosis not present

## 2023-09-02 DIAGNOSIS — E872 Acidosis, unspecified: Secondary | ICD-10-CM | POA: Diagnosis not present

## 2023-09-02 DIAGNOSIS — K529 Noninfective gastroenteritis and colitis, unspecified: Secondary | ICD-10-CM | POA: Diagnosis not present

## 2023-09-02 DIAGNOSIS — A419 Sepsis, unspecified organism: Secondary | ICD-10-CM | POA: Diagnosis not present

## 2023-09-02 DIAGNOSIS — R6520 Severe sepsis without septic shock: Secondary | ICD-10-CM | POA: Diagnosis not present

## 2023-09-02 DIAGNOSIS — R1084 Generalized abdominal pain: Secondary | ICD-10-CM | POA: Diagnosis not present

## 2023-09-02 DIAGNOSIS — E875 Hyperkalemia: Secondary | ICD-10-CM | POA: Diagnosis not present

## 2023-09-03 DIAGNOSIS — E875 Hyperkalemia: Secondary | ICD-10-CM | POA: Diagnosis not present

## 2023-09-03 DIAGNOSIS — E872 Acidosis, unspecified: Secondary | ICD-10-CM | POA: Diagnosis not present

## 2023-09-03 DIAGNOSIS — K3184 Gastroparesis: Secondary | ICD-10-CM | POA: Diagnosis not present

## 2023-09-03 DIAGNOSIS — E871 Hypo-osmolality and hyponatremia: Secondary | ICD-10-CM | POA: Diagnosis not present

## 2023-09-03 DIAGNOSIS — G928 Other toxic encephalopathy: Secondary | ICD-10-CM | POA: Diagnosis not present

## 2023-09-03 DIAGNOSIS — N1831 Chronic kidney disease, stage 3a: Secondary | ICD-10-CM | POA: Diagnosis not present

## 2023-09-03 DIAGNOSIS — E876 Hypokalemia: Secondary | ICD-10-CM | POA: Diagnosis not present

## 2023-09-03 DIAGNOSIS — E11319 Type 2 diabetes mellitus with unspecified diabetic retinopathy without macular edema: Secondary | ICD-10-CM | POA: Diagnosis not present

## 2023-09-03 DIAGNOSIS — A419 Sepsis, unspecified organism: Secondary | ICD-10-CM | POA: Diagnosis not present

## 2023-09-03 DIAGNOSIS — I129 Hypertensive chronic kidney disease with stage 1 through stage 4 chronic kidney disease, or unspecified chronic kidney disease: Secondary | ICD-10-CM | POA: Diagnosis not present

## 2023-09-03 DIAGNOSIS — G9340 Encephalopathy, unspecified: Secondary | ICD-10-CM | POA: Diagnosis not present

## 2023-09-03 DIAGNOSIS — R6520 Severe sepsis without septic shock: Secondary | ICD-10-CM | POA: Diagnosis not present

## 2023-09-03 DIAGNOSIS — E1122 Type 2 diabetes mellitus with diabetic chronic kidney disease: Secondary | ICD-10-CM | POA: Diagnosis not present

## 2023-09-03 DIAGNOSIS — R1110 Vomiting, unspecified: Secondary | ICD-10-CM | POA: Diagnosis not present

## 2023-09-03 DIAGNOSIS — K529 Noninfective gastroenteritis and colitis, unspecified: Secondary | ICD-10-CM | POA: Diagnosis not present

## 2023-09-03 DIAGNOSIS — R1084 Generalized abdominal pain: Secondary | ICD-10-CM | POA: Diagnosis not present

## 2023-09-03 DIAGNOSIS — E1143 Type 2 diabetes mellitus with diabetic autonomic (poly)neuropathy: Secondary | ICD-10-CM | POA: Diagnosis not present

## 2023-09-03 DIAGNOSIS — N179 Acute kidney failure, unspecified: Secondary | ICD-10-CM | POA: Diagnosis not present

## 2023-09-04 DIAGNOSIS — K3184 Gastroparesis: Secondary | ICD-10-CM | POA: Diagnosis not present

## 2023-09-04 DIAGNOSIS — N179 Acute kidney failure, unspecified: Secondary | ICD-10-CM | POA: Diagnosis not present

## 2023-09-04 DIAGNOSIS — E875 Hyperkalemia: Secondary | ICD-10-CM | POA: Diagnosis not present

## 2023-09-04 DIAGNOSIS — G928 Other toxic encephalopathy: Secondary | ICD-10-CM | POA: Diagnosis not present

## 2023-09-04 DIAGNOSIS — E1122 Type 2 diabetes mellitus with diabetic chronic kidney disease: Secondary | ICD-10-CM | POA: Diagnosis not present

## 2023-09-04 DIAGNOSIS — I129 Hypertensive chronic kidney disease with stage 1 through stage 4 chronic kidney disease, or unspecified chronic kidney disease: Secondary | ICD-10-CM | POA: Diagnosis not present

## 2023-09-04 DIAGNOSIS — E11319 Type 2 diabetes mellitus with unspecified diabetic retinopathy without macular edema: Secondary | ICD-10-CM | POA: Diagnosis not present

## 2023-09-04 DIAGNOSIS — E876 Hypokalemia: Secondary | ICD-10-CM | POA: Diagnosis not present

## 2023-09-04 DIAGNOSIS — R1084 Generalized abdominal pain: Secondary | ICD-10-CM | POA: Diagnosis not present

## 2023-09-04 DIAGNOSIS — K529 Noninfective gastroenteritis and colitis, unspecified: Secondary | ICD-10-CM | POA: Diagnosis not present

## 2023-09-04 DIAGNOSIS — R6520 Severe sepsis without septic shock: Secondary | ICD-10-CM | POA: Diagnosis not present

## 2023-09-04 DIAGNOSIS — N1831 Chronic kidney disease, stage 3a: Secondary | ICD-10-CM | POA: Diagnosis not present

## 2023-09-04 DIAGNOSIS — E872 Acidosis, unspecified: Secondary | ICD-10-CM | POA: Diagnosis not present

## 2023-09-04 DIAGNOSIS — E1143 Type 2 diabetes mellitus with diabetic autonomic (poly)neuropathy: Secondary | ICD-10-CM | POA: Diagnosis not present

## 2023-09-04 DIAGNOSIS — A419 Sepsis, unspecified organism: Secondary | ICD-10-CM | POA: Diagnosis not present

## 2023-09-04 DIAGNOSIS — E871 Hypo-osmolality and hyponatremia: Secondary | ICD-10-CM | POA: Diagnosis not present

## 2023-09-05 DIAGNOSIS — E875 Hyperkalemia: Secondary | ICD-10-CM | POA: Diagnosis not present

## 2023-09-05 DIAGNOSIS — R6520 Severe sepsis without septic shock: Secondary | ICD-10-CM | POA: Diagnosis not present

## 2023-09-05 DIAGNOSIS — I129 Hypertensive chronic kidney disease with stage 1 through stage 4 chronic kidney disease, or unspecified chronic kidney disease: Secondary | ICD-10-CM | POA: Diagnosis not present

## 2023-09-05 DIAGNOSIS — N1831 Chronic kidney disease, stage 3a: Secondary | ICD-10-CM | POA: Diagnosis not present

## 2023-09-05 DIAGNOSIS — E876 Hypokalemia: Secondary | ICD-10-CM | POA: Diagnosis not present

## 2023-09-05 DIAGNOSIS — K529 Noninfective gastroenteritis and colitis, unspecified: Secondary | ICD-10-CM | POA: Diagnosis not present

## 2023-09-05 DIAGNOSIS — E872 Acidosis, unspecified: Secondary | ICD-10-CM | POA: Diagnosis not present

## 2023-09-05 DIAGNOSIS — K3184 Gastroparesis: Secondary | ICD-10-CM | POA: Diagnosis not present

## 2023-09-05 DIAGNOSIS — N179 Acute kidney failure, unspecified: Secondary | ICD-10-CM | POA: Diagnosis not present

## 2023-09-05 DIAGNOSIS — E11319 Type 2 diabetes mellitus with unspecified diabetic retinopathy without macular edema: Secondary | ICD-10-CM | POA: Diagnosis not present

## 2023-09-05 DIAGNOSIS — E871 Hypo-osmolality and hyponatremia: Secondary | ICD-10-CM | POA: Diagnosis not present

## 2023-09-05 DIAGNOSIS — E1122 Type 2 diabetes mellitus with diabetic chronic kidney disease: Secondary | ICD-10-CM | POA: Diagnosis not present

## 2023-09-05 DIAGNOSIS — A419 Sepsis, unspecified organism: Secondary | ICD-10-CM | POA: Diagnosis not present

## 2023-09-05 DIAGNOSIS — G928 Other toxic encephalopathy: Secondary | ICD-10-CM | POA: Diagnosis not present

## 2023-09-05 DIAGNOSIS — E1143 Type 2 diabetes mellitus with diabetic autonomic (poly)neuropathy: Secondary | ICD-10-CM | POA: Diagnosis not present

## 2023-09-05 DIAGNOSIS — R1084 Generalized abdominal pain: Secondary | ICD-10-CM | POA: Diagnosis not present

## 2023-09-06 DIAGNOSIS — E871 Hypo-osmolality and hyponatremia: Secondary | ICD-10-CM | POA: Diagnosis not present

## 2023-09-06 DIAGNOSIS — N179 Acute kidney failure, unspecified: Secondary | ICD-10-CM | POA: Diagnosis not present

## 2023-09-06 DIAGNOSIS — N1831 Chronic kidney disease, stage 3a: Secondary | ICD-10-CM | POA: Diagnosis not present

## 2023-09-06 DIAGNOSIS — E1143 Type 2 diabetes mellitus with diabetic autonomic (poly)neuropathy: Secondary | ICD-10-CM | POA: Diagnosis not present

## 2023-09-06 DIAGNOSIS — F331 Major depressive disorder, recurrent, moderate: Secondary | ICD-10-CM | POA: Diagnosis not present

## 2023-09-06 DIAGNOSIS — E876 Hypokalemia: Secondary | ICD-10-CM | POA: Diagnosis not present

## 2023-09-06 DIAGNOSIS — A419 Sepsis, unspecified organism: Secondary | ICD-10-CM | POA: Diagnosis not present

## 2023-09-06 DIAGNOSIS — R6520 Severe sepsis without septic shock: Secondary | ICD-10-CM | POA: Diagnosis not present

## 2023-09-06 DIAGNOSIS — E11319 Type 2 diabetes mellitus with unspecified diabetic retinopathy without macular edema: Secondary | ICD-10-CM | POA: Diagnosis not present

## 2023-09-06 DIAGNOSIS — K529 Noninfective gastroenteritis and colitis, unspecified: Secondary | ICD-10-CM | POA: Diagnosis not present

## 2023-09-06 DIAGNOSIS — I129 Hypertensive chronic kidney disease with stage 1 through stage 4 chronic kidney disease, or unspecified chronic kidney disease: Secondary | ICD-10-CM | POA: Diagnosis not present

## 2023-09-06 DIAGNOSIS — R1084 Generalized abdominal pain: Secondary | ICD-10-CM | POA: Diagnosis not present

## 2023-09-06 DIAGNOSIS — E1122 Type 2 diabetes mellitus with diabetic chronic kidney disease: Secondary | ICD-10-CM | POA: Diagnosis not present

## 2023-09-06 DIAGNOSIS — E875 Hyperkalemia: Secondary | ICD-10-CM | POA: Diagnosis not present

## 2023-09-06 DIAGNOSIS — K3184 Gastroparesis: Secondary | ICD-10-CM | POA: Diagnosis not present

## 2023-09-06 DIAGNOSIS — E872 Acidosis, unspecified: Secondary | ICD-10-CM | POA: Diagnosis not present

## 2023-09-06 DIAGNOSIS — G928 Other toxic encephalopathy: Secondary | ICD-10-CM | POA: Diagnosis not present

## 2023-09-07 DIAGNOSIS — E872 Acidosis, unspecified: Secondary | ICD-10-CM | POA: Diagnosis not present

## 2023-09-07 DIAGNOSIS — E785 Hyperlipidemia, unspecified: Secondary | ICD-10-CM | POA: Diagnosis not present

## 2023-09-07 DIAGNOSIS — E875 Hyperkalemia: Secondary | ICD-10-CM | POA: Diagnosis not present

## 2023-09-07 DIAGNOSIS — G9341 Metabolic encephalopathy: Secondary | ICD-10-CM | POA: Diagnosis not present

## 2023-09-07 DIAGNOSIS — E8721 Acute metabolic acidosis: Secondary | ICD-10-CM | POA: Diagnosis not present

## 2023-09-07 DIAGNOSIS — K51018 Ulcerative (chronic) pancolitis with other complication: Secondary | ICD-10-CM | POA: Diagnosis not present

## 2023-09-07 DIAGNOSIS — R1084 Generalized abdominal pain: Secondary | ICD-10-CM | POA: Diagnosis not present

## 2023-09-07 DIAGNOSIS — B3731 Acute candidiasis of vulva and vagina: Secondary | ICD-10-CM | POA: Diagnosis not present

## 2023-09-07 DIAGNOSIS — F909 Attention-deficit hyperactivity disorder, unspecified type: Secondary | ICD-10-CM | POA: Diagnosis not present

## 2023-09-07 DIAGNOSIS — E1169 Type 2 diabetes mellitus with other specified complication: Secondary | ICD-10-CM | POA: Diagnosis not present

## 2023-09-07 DIAGNOSIS — M6281 Muscle weakness (generalized): Secondary | ICD-10-CM | POA: Diagnosis not present

## 2023-09-07 DIAGNOSIS — R7989 Other specified abnormal findings of blood chemistry: Secondary | ICD-10-CM | POA: Diagnosis not present

## 2023-09-07 DIAGNOSIS — R110 Nausea: Secondary | ICD-10-CM | POA: Diagnosis not present

## 2023-09-07 DIAGNOSIS — N1831 Chronic kidney disease, stage 3a: Secondary | ICD-10-CM | POA: Diagnosis not present

## 2023-09-07 DIAGNOSIS — G928 Other toxic encephalopathy: Secondary | ICD-10-CM | POA: Diagnosis not present

## 2023-09-07 DIAGNOSIS — K3184 Gastroparesis: Secondary | ICD-10-CM | POA: Diagnosis present

## 2023-09-07 DIAGNOSIS — G4489 Other headache syndrome: Secondary | ICD-10-CM | POA: Diagnosis not present

## 2023-09-07 DIAGNOSIS — R011 Cardiac murmur, unspecified: Secondary | ICD-10-CM | POA: Diagnosis not present

## 2023-09-07 DIAGNOSIS — I10 Essential (primary) hypertension: Secondary | ICD-10-CM | POA: Diagnosis not present

## 2023-09-07 DIAGNOSIS — F411 Generalized anxiety disorder: Secondary | ICD-10-CM | POA: Diagnosis not present

## 2023-09-07 DIAGNOSIS — G4730 Sleep apnea, unspecified: Secondary | ICD-10-CM | POA: Diagnosis not present

## 2023-09-07 DIAGNOSIS — E871 Hypo-osmolality and hyponatremia: Secondary | ICD-10-CM | POA: Diagnosis not present

## 2023-09-07 DIAGNOSIS — E1022 Type 1 diabetes mellitus with diabetic chronic kidney disease: Secondary | ICD-10-CM | POA: Diagnosis not present

## 2023-09-07 DIAGNOSIS — N179 Acute kidney failure, unspecified: Secondary | ICD-10-CM | POA: Diagnosis not present

## 2023-09-07 DIAGNOSIS — N183 Chronic kidney disease, stage 3 unspecified: Secondary | ICD-10-CM | POA: Diagnosis not present

## 2023-09-07 DIAGNOSIS — E1143 Type 2 diabetes mellitus with diabetic autonomic (poly)neuropathy: Secondary | ICD-10-CM | POA: Diagnosis present

## 2023-09-07 DIAGNOSIS — E113512 Type 2 diabetes mellitus with proliferative diabetic retinopathy with macular edema, left eye: Secondary | ICD-10-CM | POA: Diagnosis not present

## 2023-09-07 DIAGNOSIS — F32A Depression, unspecified: Secondary | ICD-10-CM | POA: Diagnosis not present

## 2023-09-07 DIAGNOSIS — E11319 Type 2 diabetes mellitus with unspecified diabetic retinopathy without macular edema: Secondary | ICD-10-CM | POA: Diagnosis not present

## 2023-09-07 DIAGNOSIS — R262 Difficulty in walking, not elsewhere classified: Secondary | ICD-10-CM | POA: Diagnosis not present

## 2023-09-07 DIAGNOSIS — R197 Diarrhea, unspecified: Secondary | ICD-10-CM | POA: Diagnosis not present

## 2023-09-07 DIAGNOSIS — E1069 Type 1 diabetes mellitus with other specified complication: Secondary | ICD-10-CM | POA: Diagnosis not present

## 2023-09-07 DIAGNOSIS — I129 Hypertensive chronic kidney disease with stage 1 through stage 4 chronic kidney disease, or unspecified chronic kidney disease: Secondary | ICD-10-CM | POA: Diagnosis present

## 2023-09-07 DIAGNOSIS — I6389 Other cerebral infarction: Secondary | ICD-10-CM | POA: Diagnosis not present

## 2023-09-07 DIAGNOSIS — R2681 Unsteadiness on feet: Secondary | ICD-10-CM | POA: Diagnosis not present

## 2023-09-07 DIAGNOSIS — E1040 Type 1 diabetes mellitus with diabetic neuropathy, unspecified: Secondary | ICD-10-CM | POA: Diagnosis not present

## 2023-09-07 DIAGNOSIS — R6520 Severe sepsis without septic shock: Secondary | ICD-10-CM | POA: Diagnosis not present

## 2023-09-07 DIAGNOSIS — F419 Anxiety disorder, unspecified: Secondary | ICD-10-CM | POA: Diagnosis not present

## 2023-09-07 DIAGNOSIS — R1111 Vomiting without nausea: Secondary | ICD-10-CM | POA: Diagnosis not present

## 2023-09-07 DIAGNOSIS — R112 Nausea with vomiting, unspecified: Secondary | ICD-10-CM | POA: Diagnosis not present

## 2023-09-07 DIAGNOSIS — H548 Legal blindness, as defined in USA: Secondary | ICD-10-CM | POA: Diagnosis not present

## 2023-09-07 DIAGNOSIS — Z794 Long term (current) use of insulin: Secondary | ICD-10-CM | POA: Diagnosis not present

## 2023-09-07 DIAGNOSIS — A419 Sepsis, unspecified organism: Secondary | ICD-10-CM | POA: Diagnosis not present

## 2023-09-07 DIAGNOSIS — E876 Hypokalemia: Secondary | ICD-10-CM | POA: Diagnosis not present

## 2023-09-07 DIAGNOSIS — F331 Major depressive disorder, recurrent, moderate: Secondary | ICD-10-CM | POA: Diagnosis not present

## 2023-09-07 DIAGNOSIS — Z7982 Long term (current) use of aspirin: Secondary | ICD-10-CM | POA: Diagnosis not present

## 2023-09-07 DIAGNOSIS — E1122 Type 2 diabetes mellitus with diabetic chronic kidney disease: Secondary | ICD-10-CM | POA: Diagnosis present

## 2023-09-07 DIAGNOSIS — E1165 Type 2 diabetes mellitus with hyperglycemia: Secondary | ICD-10-CM | POA: Diagnosis not present

## 2023-09-07 DIAGNOSIS — T502X5A Adverse effect of carbonic-anhydrase inhibitors, benzothiadiazides and other diuretics, initial encounter: Secondary | ICD-10-CM | POA: Diagnosis not present

## 2023-09-07 DIAGNOSIS — H409 Unspecified glaucoma: Secondary | ICD-10-CM | POA: Diagnosis not present

## 2023-09-07 DIAGNOSIS — Z7984 Long term (current) use of oral hypoglycemic drugs: Secondary | ICD-10-CM | POA: Diagnosis not present

## 2023-09-07 DIAGNOSIS — E10319 Type 1 diabetes mellitus with unspecified diabetic retinopathy without macular edema: Secondary | ICD-10-CM | POA: Diagnosis not present

## 2023-09-07 DIAGNOSIS — K529 Noninfective gastroenteritis and colitis, unspecified: Secondary | ICD-10-CM | POA: Diagnosis not present

## 2023-09-08 DIAGNOSIS — E871 Hypo-osmolality and hyponatremia: Secondary | ICD-10-CM | POA: Diagnosis not present

## 2023-09-08 DIAGNOSIS — N179 Acute kidney failure, unspecified: Secondary | ICD-10-CM | POA: Diagnosis not present

## 2023-09-08 DIAGNOSIS — K529 Noninfective gastroenteritis and colitis, unspecified: Secondary | ICD-10-CM | POA: Diagnosis not present

## 2023-09-08 DIAGNOSIS — E1069 Type 1 diabetes mellitus with other specified complication: Secondary | ICD-10-CM | POA: Diagnosis not present

## 2023-09-08 DIAGNOSIS — I10 Essential (primary) hypertension: Secondary | ICD-10-CM | POA: Diagnosis not present

## 2023-09-08 DIAGNOSIS — I6389 Other cerebral infarction: Secondary | ICD-10-CM | POA: Diagnosis not present

## 2023-09-08 DIAGNOSIS — R011 Cardiac murmur, unspecified: Secondary | ICD-10-CM | POA: Diagnosis not present

## 2023-09-08 DIAGNOSIS — F331 Major depressive disorder, recurrent, moderate: Secondary | ICD-10-CM | POA: Diagnosis not present

## 2023-09-08 DIAGNOSIS — F909 Attention-deficit hyperactivity disorder, unspecified type: Secondary | ICD-10-CM | POA: Diagnosis not present

## 2023-09-14 DIAGNOSIS — B3731 Acute candidiasis of vulva and vagina: Secondary | ICD-10-CM | POA: Diagnosis not present

## 2023-09-14 DIAGNOSIS — E1169 Type 2 diabetes mellitus with other specified complication: Secondary | ICD-10-CM | POA: Diagnosis not present

## 2023-09-14 DIAGNOSIS — I10 Essential (primary) hypertension: Secondary | ICD-10-CM | POA: Diagnosis not present

## 2023-09-14 DIAGNOSIS — K3184 Gastroparesis: Secondary | ICD-10-CM | POA: Diagnosis not present

## 2023-09-14 DIAGNOSIS — F331 Major depressive disorder, recurrent, moderate: Secondary | ICD-10-CM | POA: Diagnosis not present

## 2023-09-14 DIAGNOSIS — E1143 Type 2 diabetes mellitus with diabetic autonomic (poly)neuropathy: Secondary | ICD-10-CM | POA: Diagnosis not present

## 2023-09-14 DIAGNOSIS — E871 Hypo-osmolality and hyponatremia: Secondary | ICD-10-CM | POA: Diagnosis not present

## 2023-09-14 DIAGNOSIS — R197 Diarrhea, unspecified: Secondary | ICD-10-CM | POA: Diagnosis not present

## 2023-09-14 DIAGNOSIS — F909 Attention-deficit hyperactivity disorder, unspecified type: Secondary | ICD-10-CM | POA: Diagnosis not present

## 2023-09-19 DIAGNOSIS — E1165 Type 2 diabetes mellitus with hyperglycemia: Secondary | ICD-10-CM | POA: Diagnosis not present

## 2023-09-19 DIAGNOSIS — I10 Essential (primary) hypertension: Secondary | ICD-10-CM | POA: Diagnosis not present

## 2023-09-19 DIAGNOSIS — R7989 Other specified abnormal findings of blood chemistry: Secondary | ICD-10-CM | POA: Diagnosis not present

## 2023-09-19 DIAGNOSIS — G4489 Other headache syndrome: Secondary | ICD-10-CM | POA: Diagnosis not present

## 2023-09-19 DIAGNOSIS — Z7984 Long term (current) use of oral hypoglycemic drugs: Secondary | ICD-10-CM | POA: Diagnosis not present

## 2023-09-19 DIAGNOSIS — T502X5A Adverse effect of carbonic-anhydrase inhibitors, benzothiadiazides and other diuretics, initial encounter: Secondary | ICD-10-CM | POA: Diagnosis not present

## 2023-09-19 DIAGNOSIS — N183 Chronic kidney disease, stage 3 unspecified: Secondary | ICD-10-CM | POA: Diagnosis not present

## 2023-09-19 DIAGNOSIS — I129 Hypertensive chronic kidney disease with stage 1 through stage 4 chronic kidney disease, or unspecified chronic kidney disease: Secondary | ICD-10-CM | POA: Diagnosis not present

## 2023-09-19 DIAGNOSIS — Z7982 Long term (current) use of aspirin: Secondary | ICD-10-CM | POA: Diagnosis not present

## 2023-09-19 DIAGNOSIS — E1122 Type 2 diabetes mellitus with diabetic chronic kidney disease: Secondary | ICD-10-CM | POA: Diagnosis not present

## 2023-09-19 DIAGNOSIS — H548 Legal blindness, as defined in USA: Secondary | ICD-10-CM | POA: Diagnosis not present

## 2023-09-19 DIAGNOSIS — R112 Nausea with vomiting, unspecified: Secondary | ICD-10-CM | POA: Diagnosis not present

## 2023-09-19 DIAGNOSIS — F909 Attention-deficit hyperactivity disorder, unspecified type: Secondary | ICD-10-CM | POA: Diagnosis not present

## 2023-09-19 DIAGNOSIS — Z794 Long term (current) use of insulin: Secondary | ICD-10-CM | POA: Diagnosis not present

## 2023-09-19 DIAGNOSIS — F32A Depression, unspecified: Secondary | ICD-10-CM | POA: Diagnosis not present

## 2023-09-19 DIAGNOSIS — R110 Nausea: Secondary | ICD-10-CM | POA: Diagnosis not present

## 2023-09-19 DIAGNOSIS — E785 Hyperlipidemia, unspecified: Secondary | ICD-10-CM | POA: Diagnosis not present

## 2023-09-19 DIAGNOSIS — K3184 Gastroparesis: Secondary | ICD-10-CM | POA: Diagnosis not present

## 2023-09-19 DIAGNOSIS — E871 Hypo-osmolality and hyponatremia: Secondary | ICD-10-CM | POA: Diagnosis not present

## 2023-09-19 DIAGNOSIS — F419 Anxiety disorder, unspecified: Secondary | ICD-10-CM | POA: Diagnosis not present

## 2023-09-19 DIAGNOSIS — R1111 Vomiting without nausea: Secondary | ICD-10-CM | POA: Diagnosis not present

## 2023-09-19 DIAGNOSIS — E1143 Type 2 diabetes mellitus with diabetic autonomic (poly)neuropathy: Secondary | ICD-10-CM | POA: Diagnosis not present

## 2023-10-02 DIAGNOSIS — I350 Nonrheumatic aortic (valve) stenosis: Secondary | ICD-10-CM | POA: Diagnosis not present

## 2023-10-09 DIAGNOSIS — F988 Other specified behavioral and emotional disorders with onset usually occurring in childhood and adolescence: Secondary | ICD-10-CM | POA: Diagnosis not present

## 2023-10-09 DIAGNOSIS — F419 Anxiety disorder, unspecified: Secondary | ICD-10-CM | POA: Diagnosis not present

## 2023-10-09 DIAGNOSIS — E871 Hypo-osmolality and hyponatremia: Secondary | ICD-10-CM | POA: Diagnosis not present

## 2023-10-09 DIAGNOSIS — Z1231 Encounter for screening mammogram for malignant neoplasm of breast: Secondary | ICD-10-CM | POA: Diagnosis not present

## 2023-10-09 DIAGNOSIS — D649 Anemia, unspecified: Secondary | ICD-10-CM | POA: Diagnosis not present

## 2023-10-09 DIAGNOSIS — I10 Essential (primary) hypertension: Secondary | ICD-10-CM | POA: Diagnosis not present

## 2023-10-16 ENCOUNTER — Other Ambulatory Visit: Payer: Self-pay | Admitting: Family Medicine

## 2023-10-16 DIAGNOSIS — Z79899 Other long term (current) drug therapy: Secondary | ICD-10-CM | POA: Diagnosis not present

## 2023-10-16 DIAGNOSIS — Z79891 Long term (current) use of opiate analgesic: Secondary | ICD-10-CM | POA: Diagnosis not present

## 2023-10-16 DIAGNOSIS — Z794 Long term (current) use of insulin: Secondary | ICD-10-CM | POA: Diagnosis not present

## 2023-10-16 DIAGNOSIS — E222 Syndrome of inappropriate secretion of antidiuretic hormone: Secondary | ICD-10-CM | POA: Diagnosis not present

## 2023-10-16 DIAGNOSIS — Z8673 Personal history of transient ischemic attack (TIA), and cerebral infarction without residual deficits: Secondary | ICD-10-CM | POA: Diagnosis not present

## 2023-10-16 DIAGNOSIS — M549 Dorsalgia, unspecified: Secondary | ICD-10-CM | POA: Diagnosis not present

## 2023-10-16 DIAGNOSIS — I10 Essential (primary) hypertension: Secondary | ICD-10-CM | POA: Diagnosis not present

## 2023-10-16 DIAGNOSIS — R079 Chest pain, unspecified: Secondary | ICD-10-CM | POA: Diagnosis not present

## 2023-10-16 DIAGNOSIS — I700 Atherosclerosis of aorta: Secondary | ICD-10-CM | POA: Diagnosis not present

## 2023-10-16 DIAGNOSIS — I129 Hypertensive chronic kidney disease with stage 1 through stage 4 chronic kidney disease, or unspecified chronic kidney disease: Secondary | ICD-10-CM | POA: Diagnosis not present

## 2023-10-16 DIAGNOSIS — Z1330 Encounter for screening examination for mental health and behavioral disorders, unspecified: Secondary | ICD-10-CM | POA: Diagnosis not present

## 2023-10-16 DIAGNOSIS — T39395A Adverse effect of other nonsteroidal anti-inflammatory drugs [NSAID], initial encounter: Secondary | ICD-10-CM | POA: Diagnosis not present

## 2023-10-16 DIAGNOSIS — N179 Acute kidney failure, unspecified: Secondary | ICD-10-CM | POA: Diagnosis not present

## 2023-10-16 DIAGNOSIS — G9341 Metabolic encephalopathy: Secondary | ICD-10-CM | POA: Diagnosis not present

## 2023-10-16 DIAGNOSIS — H548 Legal blindness, as defined in USA: Secondary | ICD-10-CM | POA: Diagnosis not present

## 2023-10-16 DIAGNOSIS — Z7982 Long term (current) use of aspirin: Secondary | ICD-10-CM | POA: Diagnosis not present

## 2023-10-16 DIAGNOSIS — Z9049 Acquired absence of other specified parts of digestive tract: Secondary | ICD-10-CM | POA: Diagnosis not present

## 2023-10-16 DIAGNOSIS — Z9841 Cataract extraction status, right eye: Secondary | ICD-10-CM | POA: Diagnosis not present

## 2023-10-16 DIAGNOSIS — E109 Type 1 diabetes mellitus without complications: Secondary | ICD-10-CM | POA: Diagnosis not present

## 2023-10-16 DIAGNOSIS — E103593 Type 1 diabetes mellitus with proliferative diabetic retinopathy without macular edema, bilateral: Secondary | ICD-10-CM | POA: Diagnosis not present

## 2023-10-16 DIAGNOSIS — R296 Repeated falls: Secondary | ICD-10-CM | POA: Diagnosis not present

## 2023-10-16 DIAGNOSIS — I639 Cerebral infarction, unspecified: Secondary | ICD-10-CM | POA: Diagnosis not present

## 2023-10-16 DIAGNOSIS — T502X5A Adverse effect of carbonic-anhydrase inhibitors, benzothiadiazides and other diuretics, initial encounter: Secondary | ICD-10-CM | POA: Diagnosis not present

## 2023-10-16 DIAGNOSIS — G8929 Other chronic pain: Secondary | ICD-10-CM | POA: Diagnosis not present

## 2023-10-16 DIAGNOSIS — R2689 Other abnormalities of gait and mobility: Secondary | ICD-10-CM | POA: Diagnosis not present

## 2023-10-16 DIAGNOSIS — M48062 Spinal stenosis, lumbar region with neurogenic claudication: Secondary | ICD-10-CM | POA: Diagnosis not present

## 2023-10-16 DIAGNOSIS — Z1231 Encounter for screening mammogram for malignant neoplasm of breast: Secondary | ICD-10-CM

## 2023-10-16 DIAGNOSIS — F411 Generalized anxiety disorder: Secondary | ICD-10-CM | POA: Diagnosis not present

## 2023-10-16 DIAGNOSIS — R4182 Altered mental status, unspecified: Secondary | ICD-10-CM | POA: Diagnosis not present

## 2023-10-16 DIAGNOSIS — R7989 Other specified abnormal findings of blood chemistry: Secondary | ICD-10-CM | POA: Diagnosis not present

## 2023-10-16 DIAGNOSIS — E860 Dehydration: Secondary | ICD-10-CM | POA: Diagnosis not present

## 2023-10-16 DIAGNOSIS — F339 Major depressive disorder, recurrent, unspecified: Secondary | ICD-10-CM | POA: Diagnosis not present

## 2023-10-16 DIAGNOSIS — M4316 Spondylolisthesis, lumbar region: Secondary | ICD-10-CM | POA: Diagnosis not present

## 2023-10-16 DIAGNOSIS — R531 Weakness: Secondary | ICD-10-CM | POA: Diagnosis not present

## 2023-10-16 DIAGNOSIS — M47816 Spondylosis without myelopathy or radiculopathy, lumbar region: Secondary | ICD-10-CM | POA: Diagnosis not present

## 2023-10-16 DIAGNOSIS — E871 Hypo-osmolality and hyponatremia: Secondary | ICD-10-CM | POA: Diagnosis not present

## 2023-10-16 DIAGNOSIS — T421X5A Adverse effect of iminostilbenes, initial encounter: Secondary | ICD-10-CM | POA: Diagnosis not present

## 2023-10-16 DIAGNOSIS — E1022 Type 1 diabetes mellitus with diabetic chronic kidney disease: Secondary | ICD-10-CM | POA: Diagnosis not present

## 2023-10-16 DIAGNOSIS — K3184 Gastroparesis: Secondary | ICD-10-CM | POA: Diagnosis not present

## 2023-10-16 DIAGNOSIS — E1065 Type 1 diabetes mellitus with hyperglycemia: Secondary | ICD-10-CM | POA: Diagnosis not present

## 2023-10-16 DIAGNOSIS — M5416 Radiculopathy, lumbar region: Secondary | ICD-10-CM | POA: Diagnosis not present

## 2023-10-16 DIAGNOSIS — E1043 Type 1 diabetes mellitus with diabetic autonomic (poly)neuropathy: Secondary | ICD-10-CM | POA: Diagnosis not present

## 2023-10-16 DIAGNOSIS — Z9181 History of falling: Secondary | ICD-10-CM | POA: Diagnosis not present

## 2023-10-16 DIAGNOSIS — N183 Chronic kidney disease, stage 3 unspecified: Secondary | ICD-10-CM | POA: Diagnosis not present

## 2023-10-16 DIAGNOSIS — T43215A Adverse effect of selective serotonin and norepinephrine reuptake inhibitors, initial encounter: Secondary | ICD-10-CM | POA: Diagnosis not present

## 2023-10-16 DIAGNOSIS — Z9151 Personal history of suicidal behavior: Secondary | ICD-10-CM | POA: Diagnosis not present

## 2023-10-24 DIAGNOSIS — D519 Vitamin B12 deficiency anemia, unspecified: Secondary | ICD-10-CM | POA: Diagnosis not present

## 2023-10-24 DIAGNOSIS — J189 Pneumonia, unspecified organism: Secondary | ICD-10-CM | POA: Diagnosis not present

## 2023-10-24 DIAGNOSIS — E039 Hypothyroidism, unspecified: Secondary | ICD-10-CM | POA: Diagnosis not present

## 2023-10-24 DIAGNOSIS — E109 Type 1 diabetes mellitus without complications: Secondary | ICD-10-CM | POA: Diagnosis not present

## 2023-10-25 DIAGNOSIS — F419 Anxiety disorder, unspecified: Secondary | ICD-10-CM | POA: Diagnosis not present

## 2023-10-25 DIAGNOSIS — Z79899 Other long term (current) drug therapy: Secondary | ICD-10-CM | POA: Diagnosis not present

## 2023-10-25 DIAGNOSIS — D649 Anemia, unspecified: Secondary | ICD-10-CM | POA: Diagnosis not present

## 2023-10-25 DIAGNOSIS — E871 Hypo-osmolality and hyponatremia: Secondary | ICD-10-CM | POA: Diagnosis not present

## 2023-10-25 DIAGNOSIS — Z7689 Persons encountering health services in other specified circumstances: Secondary | ICD-10-CM | POA: Diagnosis not present

## 2023-10-25 DIAGNOSIS — E1040 Type 1 diabetes mellitus with diabetic neuropathy, unspecified: Secondary | ICD-10-CM | POA: Diagnosis not present

## 2023-10-26 DIAGNOSIS — G8929 Other chronic pain: Secondary | ICD-10-CM | POA: Diagnosis not present

## 2023-10-26 DIAGNOSIS — Z556 Problems related to health literacy: Secondary | ICD-10-CM | POA: Diagnosis not present

## 2023-10-26 DIAGNOSIS — E785 Hyperlipidemia, unspecified: Secondary | ICD-10-CM | POA: Diagnosis not present

## 2023-10-26 DIAGNOSIS — I10 Essential (primary) hypertension: Secondary | ICD-10-CM | POA: Diagnosis not present

## 2023-10-26 DIAGNOSIS — M549 Dorsalgia, unspecified: Secondary | ICD-10-CM | POA: Diagnosis not present

## 2023-10-26 DIAGNOSIS — F419 Anxiety disorder, unspecified: Secondary | ICD-10-CM | POA: Diagnosis not present

## 2023-10-26 DIAGNOSIS — Z8673 Personal history of transient ischemic attack (TIA), and cerebral infarction without residual deficits: Secondary | ICD-10-CM | POA: Diagnosis not present

## 2023-10-26 DIAGNOSIS — E109 Type 1 diabetes mellitus without complications: Secondary | ICD-10-CM | POA: Diagnosis not present

## 2023-10-26 DIAGNOSIS — F32A Depression, unspecified: Secondary | ICD-10-CM | POA: Diagnosis not present

## 2023-10-26 DIAGNOSIS — E871 Hypo-osmolality and hyponatremia: Secondary | ICD-10-CM | POA: Diagnosis not present

## 2023-10-27 DIAGNOSIS — E785 Hyperlipidemia, unspecified: Secondary | ICD-10-CM | POA: Diagnosis not present

## 2023-10-27 DIAGNOSIS — M549 Dorsalgia, unspecified: Secondary | ICD-10-CM | POA: Diagnosis not present

## 2023-10-27 DIAGNOSIS — E109 Type 1 diabetes mellitus without complications: Secondary | ICD-10-CM | POA: Diagnosis not present

## 2023-10-27 DIAGNOSIS — I10 Essential (primary) hypertension: Secondary | ICD-10-CM | POA: Diagnosis not present

## 2023-10-27 DIAGNOSIS — E871 Hypo-osmolality and hyponatremia: Secondary | ICD-10-CM | POA: Diagnosis not present

## 2023-10-27 DIAGNOSIS — G8929 Other chronic pain: Secondary | ICD-10-CM | POA: Diagnosis not present

## 2023-10-31 DIAGNOSIS — E871 Hypo-osmolality and hyponatremia: Secondary | ICD-10-CM | POA: Diagnosis not present

## 2023-10-31 DIAGNOSIS — E785 Hyperlipidemia, unspecified: Secondary | ICD-10-CM | POA: Diagnosis not present

## 2023-10-31 DIAGNOSIS — E109 Type 1 diabetes mellitus without complications: Secondary | ICD-10-CM | POA: Diagnosis not present

## 2023-10-31 DIAGNOSIS — I10 Essential (primary) hypertension: Secondary | ICD-10-CM | POA: Diagnosis not present

## 2023-10-31 DIAGNOSIS — G8929 Other chronic pain: Secondary | ICD-10-CM | POA: Diagnosis not present

## 2023-10-31 DIAGNOSIS — M549 Dorsalgia, unspecified: Secondary | ICD-10-CM | POA: Diagnosis not present

## 2023-11-01 DIAGNOSIS — E785 Hyperlipidemia, unspecified: Secondary | ICD-10-CM | POA: Diagnosis not present

## 2023-11-01 DIAGNOSIS — G8929 Other chronic pain: Secondary | ICD-10-CM | POA: Diagnosis not present

## 2023-11-01 DIAGNOSIS — M549 Dorsalgia, unspecified: Secondary | ICD-10-CM | POA: Diagnosis not present

## 2023-11-01 DIAGNOSIS — I10 Essential (primary) hypertension: Secondary | ICD-10-CM | POA: Diagnosis not present

## 2023-11-01 DIAGNOSIS — E871 Hypo-osmolality and hyponatremia: Secondary | ICD-10-CM | POA: Diagnosis not present

## 2023-11-01 DIAGNOSIS — E109 Type 1 diabetes mellitus without complications: Secondary | ICD-10-CM | POA: Diagnosis not present

## 2023-11-02 DIAGNOSIS — E109 Type 1 diabetes mellitus without complications: Secondary | ICD-10-CM | POA: Diagnosis not present

## 2023-11-02 DIAGNOSIS — E785 Hyperlipidemia, unspecified: Secondary | ICD-10-CM | POA: Diagnosis not present

## 2023-11-02 DIAGNOSIS — Z8673 Personal history of transient ischemic attack (TIA), and cerebral infarction without residual deficits: Secondary | ICD-10-CM | POA: Diagnosis not present

## 2023-11-02 DIAGNOSIS — Z136 Encounter for screening for cardiovascular disorders: Secondary | ICD-10-CM | POA: Diagnosis not present

## 2023-11-02 DIAGNOSIS — G8929 Other chronic pain: Secondary | ICD-10-CM | POA: Diagnosis not present

## 2023-11-02 DIAGNOSIS — R0609 Other forms of dyspnea: Secondary | ICD-10-CM | POA: Diagnosis not present

## 2023-11-02 DIAGNOSIS — M549 Dorsalgia, unspecified: Secondary | ICD-10-CM | POA: Diagnosis not present

## 2023-11-02 DIAGNOSIS — D869 Sarcoidosis, unspecified: Secondary | ICD-10-CM | POA: Diagnosis not present

## 2023-11-02 DIAGNOSIS — E871 Hypo-osmolality and hyponatremia: Secondary | ICD-10-CM | POA: Diagnosis not present

## 2023-11-02 DIAGNOSIS — R072 Precordial pain: Secondary | ICD-10-CM | POA: Diagnosis not present

## 2023-11-02 DIAGNOSIS — I10 Essential (primary) hypertension: Secondary | ICD-10-CM | POA: Diagnosis not present

## 2023-11-05 DIAGNOSIS — I129 Hypertensive chronic kidney disease with stage 1 through stage 4 chronic kidney disease, or unspecified chronic kidney disease: Secondary | ICD-10-CM | POA: Diagnosis not present

## 2023-11-05 DIAGNOSIS — R112 Nausea with vomiting, unspecified: Secondary | ICD-10-CM | POA: Diagnosis not present

## 2023-11-05 DIAGNOSIS — N1831 Chronic kidney disease, stage 3a: Secondary | ICD-10-CM | POA: Diagnosis not present

## 2023-11-05 DIAGNOSIS — Z8639 Personal history of other endocrine, nutritional and metabolic disease: Secondary | ICD-10-CM | POA: Diagnosis not present

## 2023-11-05 DIAGNOSIS — Z794 Long term (current) use of insulin: Secondary | ICD-10-CM | POA: Diagnosis not present

## 2023-11-05 DIAGNOSIS — E1143 Type 2 diabetes mellitus with diabetic autonomic (poly)neuropathy: Secondary | ICD-10-CM | POA: Diagnosis not present

## 2023-11-05 DIAGNOSIS — E1122 Type 2 diabetes mellitus with diabetic chronic kidney disease: Secondary | ICD-10-CM | POA: Diagnosis not present

## 2023-11-05 DIAGNOSIS — K3184 Gastroparesis: Secondary | ICD-10-CM | POA: Diagnosis not present

## 2023-11-05 DIAGNOSIS — Z79899 Other long term (current) drug therapy: Secondary | ICD-10-CM | POA: Diagnosis not present

## 2023-11-06 DIAGNOSIS — M5416 Radiculopathy, lumbar region: Secondary | ICD-10-CM | POA: Diagnosis not present

## 2023-11-08 DIAGNOSIS — E871 Hypo-osmolality and hyponatremia: Secondary | ICD-10-CM | POA: Diagnosis not present

## 2023-11-08 DIAGNOSIS — E109 Type 1 diabetes mellitus without complications: Secondary | ICD-10-CM | POA: Diagnosis not present

## 2023-11-08 DIAGNOSIS — I10 Essential (primary) hypertension: Secondary | ICD-10-CM | POA: Diagnosis not present

## 2023-11-08 DIAGNOSIS — E785 Hyperlipidemia, unspecified: Secondary | ICD-10-CM | POA: Diagnosis not present

## 2023-11-08 DIAGNOSIS — M549 Dorsalgia, unspecified: Secondary | ICD-10-CM | POA: Diagnosis not present

## 2023-11-08 DIAGNOSIS — G8929 Other chronic pain: Secondary | ICD-10-CM | POA: Diagnosis not present

## 2023-11-09 DIAGNOSIS — M549 Dorsalgia, unspecified: Secondary | ICD-10-CM | POA: Diagnosis not present

## 2023-11-09 DIAGNOSIS — E109 Type 1 diabetes mellitus without complications: Secondary | ICD-10-CM | POA: Diagnosis not present

## 2023-11-09 DIAGNOSIS — E871 Hypo-osmolality and hyponatremia: Secondary | ICD-10-CM | POA: Diagnosis not present

## 2023-11-09 DIAGNOSIS — E785 Hyperlipidemia, unspecified: Secondary | ICD-10-CM | POA: Diagnosis not present

## 2023-11-09 DIAGNOSIS — G8929 Other chronic pain: Secondary | ICD-10-CM | POA: Diagnosis not present

## 2023-11-09 DIAGNOSIS — I10 Essential (primary) hypertension: Secondary | ICD-10-CM | POA: Diagnosis not present

## 2023-11-14 DIAGNOSIS — M549 Dorsalgia, unspecified: Secondary | ICD-10-CM | POA: Diagnosis not present

## 2023-11-14 DIAGNOSIS — G8929 Other chronic pain: Secondary | ICD-10-CM | POA: Diagnosis not present

## 2023-11-14 DIAGNOSIS — E785 Hyperlipidemia, unspecified: Secondary | ICD-10-CM | POA: Diagnosis not present

## 2023-11-14 DIAGNOSIS — E871 Hypo-osmolality and hyponatremia: Secondary | ICD-10-CM | POA: Diagnosis not present

## 2023-11-14 DIAGNOSIS — E109 Type 1 diabetes mellitus without complications: Secondary | ICD-10-CM | POA: Diagnosis not present

## 2023-11-14 DIAGNOSIS — I10 Essential (primary) hypertension: Secondary | ICD-10-CM | POA: Diagnosis not present

## 2023-11-15 DIAGNOSIS — F32A Depression, unspecified: Secondary | ICD-10-CM | POA: Diagnosis not present

## 2023-11-15 DIAGNOSIS — E1143 Type 2 diabetes mellitus with diabetic autonomic (poly)neuropathy: Secondary | ICD-10-CM | POA: Diagnosis not present

## 2023-11-15 DIAGNOSIS — E1040 Type 1 diabetes mellitus with diabetic neuropathy, unspecified: Secondary | ICD-10-CM | POA: Diagnosis not present

## 2023-11-15 DIAGNOSIS — K3184 Gastroparesis: Secondary | ICD-10-CM | POA: Diagnosis not present

## 2023-11-15 DIAGNOSIS — E871 Hypo-osmolality and hyponatremia: Secondary | ICD-10-CM | POA: Diagnosis not present

## 2023-11-16 DIAGNOSIS — I10 Essential (primary) hypertension: Secondary | ICD-10-CM | POA: Diagnosis not present

## 2023-11-16 DIAGNOSIS — E871 Hypo-osmolality and hyponatremia: Secondary | ICD-10-CM | POA: Diagnosis not present

## 2023-11-16 DIAGNOSIS — G8929 Other chronic pain: Secondary | ICD-10-CM | POA: Diagnosis not present

## 2023-11-16 DIAGNOSIS — E785 Hyperlipidemia, unspecified: Secondary | ICD-10-CM | POA: Diagnosis not present

## 2023-11-16 DIAGNOSIS — M549 Dorsalgia, unspecified: Secondary | ICD-10-CM | POA: Diagnosis not present

## 2023-11-16 DIAGNOSIS — E109 Type 1 diabetes mellitus without complications: Secondary | ICD-10-CM | POA: Diagnosis not present

## 2023-11-17 DIAGNOSIS — M549 Dorsalgia, unspecified: Secondary | ICD-10-CM | POA: Diagnosis not present

## 2023-11-17 DIAGNOSIS — H814 Vertigo of central origin: Secondary | ICD-10-CM | POA: Diagnosis not present

## 2023-11-17 DIAGNOSIS — I10 Essential (primary) hypertension: Secondary | ICD-10-CM | POA: Diagnosis not present

## 2023-11-17 DIAGNOSIS — E785 Hyperlipidemia, unspecified: Secondary | ICD-10-CM | POA: Diagnosis not present

## 2023-11-17 DIAGNOSIS — E871 Hypo-osmolality and hyponatremia: Secondary | ICD-10-CM | POA: Diagnosis not present

## 2023-11-17 DIAGNOSIS — E109 Type 1 diabetes mellitus without complications: Secondary | ICD-10-CM | POA: Diagnosis not present

## 2023-11-17 DIAGNOSIS — R42 Dizziness and giddiness: Secondary | ICD-10-CM | POA: Diagnosis not present

## 2023-11-17 DIAGNOSIS — G8929 Other chronic pain: Secondary | ICD-10-CM | POA: Diagnosis not present

## 2023-11-21 DIAGNOSIS — M549 Dorsalgia, unspecified: Secondary | ICD-10-CM | POA: Diagnosis not present

## 2023-11-21 DIAGNOSIS — E109 Type 1 diabetes mellitus without complications: Secondary | ICD-10-CM | POA: Diagnosis not present

## 2023-11-21 DIAGNOSIS — I10 Essential (primary) hypertension: Secondary | ICD-10-CM | POA: Diagnosis not present

## 2023-11-21 DIAGNOSIS — E871 Hypo-osmolality and hyponatremia: Secondary | ICD-10-CM | POA: Diagnosis not present

## 2023-11-21 DIAGNOSIS — G8929 Other chronic pain: Secondary | ICD-10-CM | POA: Diagnosis not present

## 2023-11-21 DIAGNOSIS — E785 Hyperlipidemia, unspecified: Secondary | ICD-10-CM | POA: Diagnosis not present

## 2023-11-22 DIAGNOSIS — E785 Hyperlipidemia, unspecified: Secondary | ICD-10-CM | POA: Diagnosis not present

## 2023-11-22 DIAGNOSIS — E109 Type 1 diabetes mellitus without complications: Secondary | ICD-10-CM | POA: Diagnosis not present

## 2023-11-22 DIAGNOSIS — M549 Dorsalgia, unspecified: Secondary | ICD-10-CM | POA: Diagnosis not present

## 2023-11-22 DIAGNOSIS — E871 Hypo-osmolality and hyponatremia: Secondary | ICD-10-CM | POA: Diagnosis not present

## 2023-11-22 DIAGNOSIS — G8929 Other chronic pain: Secondary | ICD-10-CM | POA: Diagnosis not present

## 2023-11-22 DIAGNOSIS — I10 Essential (primary) hypertension: Secondary | ICD-10-CM | POA: Diagnosis not present

## 2023-11-25 DIAGNOSIS — E109 Type 1 diabetes mellitus without complications: Secondary | ICD-10-CM | POA: Diagnosis not present

## 2023-11-25 DIAGNOSIS — M549 Dorsalgia, unspecified: Secondary | ICD-10-CM | POA: Diagnosis not present

## 2023-11-25 DIAGNOSIS — E871 Hypo-osmolality and hyponatremia: Secondary | ICD-10-CM | POA: Diagnosis not present

## 2023-11-25 DIAGNOSIS — I10 Essential (primary) hypertension: Secondary | ICD-10-CM | POA: Diagnosis not present

## 2023-11-25 DIAGNOSIS — Z8673 Personal history of transient ischemic attack (TIA), and cerebral infarction without residual deficits: Secondary | ICD-10-CM | POA: Diagnosis not present

## 2023-11-25 DIAGNOSIS — F419 Anxiety disorder, unspecified: Secondary | ICD-10-CM | POA: Diagnosis not present

## 2023-11-25 DIAGNOSIS — E785 Hyperlipidemia, unspecified: Secondary | ICD-10-CM | POA: Diagnosis not present

## 2023-11-25 DIAGNOSIS — G8929 Other chronic pain: Secondary | ICD-10-CM | POA: Diagnosis not present

## 2023-11-25 DIAGNOSIS — F32A Depression, unspecified: Secondary | ICD-10-CM | POA: Diagnosis not present

## 2023-11-25 DIAGNOSIS — Z556 Problems related to health literacy: Secondary | ICD-10-CM | POA: Diagnosis not present

## 2023-11-28 DIAGNOSIS — G8929 Other chronic pain: Secondary | ICD-10-CM | POA: Diagnosis not present

## 2023-11-28 DIAGNOSIS — I10 Essential (primary) hypertension: Secondary | ICD-10-CM | POA: Diagnosis not present

## 2023-11-28 DIAGNOSIS — E871 Hypo-osmolality and hyponatremia: Secondary | ICD-10-CM | POA: Diagnosis not present

## 2023-11-28 DIAGNOSIS — M549 Dorsalgia, unspecified: Secondary | ICD-10-CM | POA: Diagnosis not present

## 2023-11-28 DIAGNOSIS — E109 Type 1 diabetes mellitus without complications: Secondary | ICD-10-CM | POA: Diagnosis not present

## 2023-11-28 DIAGNOSIS — E785 Hyperlipidemia, unspecified: Secondary | ICD-10-CM | POA: Diagnosis not present

## 2023-12-01 ENCOUNTER — Encounter: Payer: Self-pay | Admitting: Internal Medicine

## 2023-12-01 ENCOUNTER — Ambulatory Visit: Admitting: Internal Medicine

## 2023-12-01 VITALS — BP 126/82 | HR 71 | Ht 61.0 in | Wt 143.8 lb

## 2023-12-01 DIAGNOSIS — Z794 Long term (current) use of insulin: Secondary | ICD-10-CM | POA: Diagnosis not present

## 2023-12-01 DIAGNOSIS — E1042 Type 1 diabetes mellitus with diabetic polyneuropathy: Secondary | ICD-10-CM

## 2023-12-01 DIAGNOSIS — E1065 Type 1 diabetes mellitus with hyperglycemia: Secondary | ICD-10-CM | POA: Diagnosis not present

## 2023-12-01 DIAGNOSIS — Z7984 Long term (current) use of oral hypoglycemic drugs: Secondary | ICD-10-CM

## 2023-12-01 DIAGNOSIS — E103593 Type 1 diabetes mellitus with proliferative diabetic retinopathy without macular edema, bilateral: Secondary | ICD-10-CM

## 2023-12-01 LAB — POCT GLYCOSYLATED HEMOGLOBIN (HGB A1C): Hemoglobin A1C: 7.9 % — AB (ref 4.0–5.6)

## 2023-12-01 MED ORDER — INSULIN LISPRO (1 UNIT DIAL) 100 UNIT/ML (KWIKPEN): PEN_INJECTOR | SUBCUTANEOUS | 3 refills | Status: DC

## 2023-12-01 MED ORDER — LANTUS SOLOSTAR 100 UNIT/ML ~~LOC~~ SOPN: 20.0000 [IU] | PEN_INJECTOR | Freq: Every day | SUBCUTANEOUS | Status: DC

## 2023-12-01 NOTE — Patient Instructions (Addendum)
  Continue Lantus  20 units ONCE daily Take Admelog  6 units with each meal Admelog  correctional insulin : ADD extra units on insulin  to your meal-time Novolin -R  dose if your blood sugars are higher than 180. Use the scale below to help guide you BEFORE each meal   Blood sugar before meal Number of units to inject  Less than 180 0 unit  180 -  230 1 units  231 -  280 2 units  281 -  330 3 units  331 -  380 4 units  381 -  430 5 units  431 -  480 6 units  481 -  530 7 units      HOW TO TREAT LOW BLOOD SUGARS (Blood sugar LESS THAN 70 MG/DL) Please follow the RULE OF 15 for the treatment of hypoglycemia treatment (when your (blood sugars are less than 70 mg/dL)   STEP 1: Take 15 grams of carbohydrates when your blood sugar is low, which includes:  3-4 GLUCOSE TABS  OR 3-4 OZ OF JUICE OR REGULAR SODA OR ONE TUBE OF GLUCOSE GEL    STEP 2: RECHECK blood sugar in 15 MINUTES STEP 3: If your blood sugar is still low at the 15 minute recheck --> then, go back to STEP 1 and treat AGAIN with another 15 grams of carbohydrates.

## 2023-12-01 NOTE — Progress Notes (Signed)
 Name: Jill [name]  MRN/ DOB: [id number], 1966/03/26   Age/ Sex: 58 y.o., female    PCP: Lizette Macario BRAVO, NP (Inactive)   Reason for Endocrinology Evaluation: Type 1 Diabetes Mellitus     Date of Initial Endocrinology Visit: 02/04/2022    PATIENT IDENTIFIER: Jill [name] is a 58 y.o. female with a past medical history of HTN,sarcoidosis, DM and dyslipidemia. The patient presented for initial endocrinology clinic visit on 02/04/2022 for consultative assistance with her diabetes management.    HPI: Jill [name] was    Diagnosed with DM at age 39 Prior Medications tried/Intolerance: Semlyn cost-prohibited  Hemoglobin A1c has ranged from 7.3% in 2008, peaking at 13.3% in 2023.  On her initial visit to our clinic she had an A1c of 13.3%, she was unclear whether she was diagnosed with type I versus type 2 diabetes, but phenotypically she was more towards type I.  She was on metformin , and insulin  mix.  We decreased metformin  by 50% due to vomiting that she attributed to metformin , we started her on NPH and regular insulin  as well as correction scale, due to insurance issues, insulin  analogs were cost prohibitive, hence we kept her on NPH and regular   She contacted our office 08/2023 requesting switching metformin  tablets to liquid   We switched NPH to insulin  analog June, 2025  Metformin  was discontinued by August, 2025 for hospitalization   The patient was started on Admelog  through patient assistance program through [facility]  SUBJECTIVE:   During the last visit (08/30/2023): A1c 10.8%  Today (12/01/23): Jill [name] is here for follow-up on diabetes management.  She checks her glucose multiple times daily through freestyle [place], she has been noted with low BG readings on freestyle libre.  She is accompanied by her spouse Medford   Since her last visit here she has been hospitalized multiple times due to variable reasons including pancolitis,  abdominal pain, nausea and vomiting, AKI She follows with nephrology for AKI, HCTZ discontinued indefinitely, on lisinopril.  During hospitalization psychiatry was consulted and Prozac was discontinued and started on BuSpar  She was also admitted for hyponatremia in July, thought to be SIADH.  Oxy carbamazepine on hold, fluid restriction was recommended  She again presented to the ED with nausea and vomiting in August, 2025  She was evaluated by podiatry 07/16/2023  No recent nausea or vomiting  She does have diarrhea at night , took imodium on the past week    HOME DIABETES REGIMEN: Metformin  500 mg liquid 5 mL BID   - not taking  Lantus  30 units daily-takes 20 units ( [facility] )  Admelog  5 units 3 times daily Correction scale: Admelog   (BG -130/50)    Statin: yes ACE-I/ARB: yes    CONTINUOUS GLUCOSE MONITORING RECORD INTERPRETATION    Dates of Recording: 8/23 - 12/01/2023  Sensor description: Freestyle libre 3+  Results statistics:   CGM use % of time 96%  Average and SD 214/34.6  Time in range     34   %  % Time Above 180 36  % Time above 250 29  % Time Below target 1   Glycemic patterns summary: BGs trend down overnight and fluctuate throughout the day  Hyperglycemic episodes all day and at times during the night as well  Hypoglycemic episodes occurred overnight  Overnight periods: Variable     DIABETIC COMPLICATIONS: Microvascular complications:  DR with macular edema stable ( legally blind) , neuropathy Denies: CKD  Last eye exam: Completed 11/29/2022  Macrovascular complications:  CVA Denies: CAD, PVD   PAST HISTORY: Past Medical History:  Past Medical History:  Diagnosis Date   ADHD    Arthralgia    Depression    Diabetes mellitus without complication (HCC)    Diabetic retinopathy (HCC)    Glaucoma    Hyperlipidemia    Hypertension    Macular edema    Visual impairment    Past Surgical History:  Past Surgical History:   Procedure Laterality Date   CATARACT EXTRACTION     left eye   CESAREAN SECTION     left foot surgery      Social History:  reports that she has never smoked. She has never used smokeless tobacco. She reports that she does not currently use alcohol. She reports that she does not currently use drugs. Family History:  Family History  Problem Relation Age of Onset   Stroke Father    Stroke Paternal Grandfather      HOME MEDICATIONS: Allergies as of 12/01/2023       Reactions   Hyoscyamine Sulfate Hives   Phenobarbital Hives   Scopolamine Hives   Pb-hyoscy-atropine-scopolamine    Canagliflozin Rash   Latex Rash   Blistery rash   Phenobarbital-belladonna Alk Rash   Sulfamethoxazole Itching        Medication List        Accurate as of December 01, 2023 11:14 AM. If you have any questions, ask your nurse or doctor.          albuterol 108 (90 Base) MCG/ACT inhaler Commonly known as: VENTOLIN HFA Inhale 1-2 puffs into the lungs every 6 (six) hours as needed for wheezing or shortness of breath.   amoxicillin-clavulanate 875-125 MG tablet Commonly known as: AUGMENTIN Take 1 tablet by mouth.   amphetamine-dextroamphetamine 20 MG tablet Commonly known as: ADDERALL Take 20 mg by mouth 2 (two) times daily.   artificial tears ophthalmic solution Place 1 drop into both eyes 2 (two) times daily as needed for dry eyes.   aspirin EC 81 MG tablet Take 81 mg by mouth daily.   Azelastine HCl 137 MCG/SPRAY Soln Place 1 spray into the nose.   buPROPion 150 MG 12 hr tablet Commonly known as: WELLBUTRIN SR Take 150 mg by mouth 2 (two) times daily.   buPROPion 300 MG 24 hr tablet Commonly known as: WELLBUTRIN XL Take 300 mg by mouth every morning.   diclofenac 75 MG EC tablet Commonly known as: VOLTAREN Take 75 mg by mouth.   DULoxetine 60 MG capsule Commonly known as: CYMBALTA Take 60 mg by mouth 2 (two) times daily.   fexofenadine 180 MG tablet Commonly known as:  ALLEGRA Take 180 mg by mouth daily.   hydrOXYzine 25 MG tablet Commonly known as: ATARAX Take 25 mg by mouth 3 (three) times daily.   Insulin  Pen Needle 32G X 4 MM Misc 1 Device by Does not apply route in the morning, at noon, in the evening, and at bedtime.   lisinopril-hydrochlorothiazide 20-25 MG tablet Commonly known as: ZESTORETIC Take 2 tablets by mouth at bedtime.   lisinopril-hydrochlorothiazide 20-12.5 MG tablet Commonly known as: ZESTORETIC Take 2 tablets by mouth daily.   meclizine 25 MG tablet Commonly known as: ANTIVERT Take 25 mg by mouth every 6 (six) hours as needed.   Metformin  HCl 500 MG/5ML Soln Take 5 mLs (500 mg total) by mouth in the morning and at bedtime.   metoCLOPramide  10 MG tablet  Commonly known as: REGLAN  Take 1 tablet (10 mg total) by mouth daily.   metoCLOPramide  5 MG tablet Commonly known as: REGLAN  Take by mouth.   NovoLIN  R FlexPen ReliOn 100 UNIT/ML FlexPen Generic drug: Insulin  Regular Human Max daily 30 units What changed: additional instructions   omeprazole 40 MG capsule Commonly known as: PRILOSEC Take 80 mg by mouth.   Oxcarbazepine  300 MG tablet Commonly known as: Trileptal  Take 1 tablet (300 mg total) by mouth daily.   pregabalin 75 MG capsule Commonly known as: LYRICA Take 75 mg by mouth.   promethazine  6.25 MG/5ML solution Commonly known as: PHENERGAN  Take 25 mg by mouth.   rosuvastatin 20 MG tablet Commonly known as: CRESTOR Take 20 mg by mouth daily.   Tresiba  FlexTouch 100 UNIT/ML FlexTouch Pen Generic drug: insulin  degludec Inject 30 Units into the skin daily. What changed: how much to take   valACYclovir  500 MG tablet Commonly known as: VALTREX  Take 1 tablet (500 mg total) by mouth 3 (three) times daily.         ALLERGIES: Allergies  Allergen Reactions   Hyoscyamine Sulfate Hives   Phenobarbital Hives   Scopolamine Hives   Pb-Hyoscy-Atropine-Scopolamine    Canagliflozin Rash   Latex Rash     Blistery rash   Phenobarbital-Belladonna Alk Rash   Sulfamethoxazole Itching     REVIEW OF SYSTEMS: A comprehensive ROS was conducted with the patient and is negative except as per HPI   OBJECTIVE:   VITAL SIGNS: BP 126/82 (BP Location: Left Arm, Patient Position: Sitting, Cuff Size: Normal)   Pulse 71   Ht 5' 1 (1.549 m)   Wt 143 lb 12.8 oz (65.2 kg)   SpO2 99%   BMI 27.17 kg/m    PHYSICAL EXAM:  General: Pt appears well and is in NAD  Lungs: Clear with good BS bilat   Heart: RRR   Extremities:  Lower extremities - No pretibial edema  Neuro: MS is good with appropriate affect, pt is alert and Ox3   DM Foot Exam 07/20/2023 per podiatry    DATA REVIEWED:  Lab Results  Component Value Date   HGBA1C 10.8 (A) 08/30/2023   HGBA1C 9.1 (A) 06/08/2022   HGBA1C 9.1 (A) 04/11/2022   11/05/2023 @[facility]  Everywhere Sodium 136 Potassium 3.9 BUN 25 CR 1.17 GFR 55   10/17/2023 TSH 1.890   Old records , labs and images have been reviewed.    ASSESSMENT / PLAN / RECOMMENDATIONS:   1) Type 1 Diabetes Mellitus, Sub- Optimally controlled, With Retinopathic, neuropathic and macrovascular complications - Most recent A1c of 7.9%. Goal A1c < 7.0 %.    -A1c has trended down from 10.8% to 7.9% -She does not like the Dexcom - She is currently on Lantus  and Admelog  through patient assistance program that was initiated through [facility] -The patient has been using Admelog  with each meal but has been using Novolin -R for correction.  I suspect this is the reason for hypoglycemia -Barriers to diabetes self-care is cognitive impairment -Metformin  was discontinued during hospitalization, which I agree with given recent GI symptoms MEDICATIONS:   Continue Lantus  20 units daily  Increase Admelog  6 units with each meal Continue correction factor: Admelog  (BG -130/50) TIDQAC  EDUCATION / INSTRUCTIONS: BG monitoring instructions: Patient is instructed to check her blood  sugars 3 times a day, before meals . Call [facility] if: BG persistently < 70  I reviewed the Rule of 15 for the treatment of hypoglycemia in detail  with the patient. Literature supplied.   2) Diabetic complications:  Eye: Does  have known diabetic retinopathy.  Neuro/ Feet: Does  have known diabetic peripheral neuropathy. Renal: Patient does not have known baseline CKD. She is  on an ACEI/ARB at present.   3) Dyslipidemia :   - Historically LDL acceptable in the past, we will continue to monitor Pt on rosuvastatin 20 mg daily   Follow-up in 4 months  I spent 25 minutes preparing to see the patient by review of recent labs, imaging and procedures, obtaining and reviewing separately obtained history, communicating with the patient/family or caregiver, ordering medications, tests or procedures, and documenting clinical information in the EHR including the differential Dx, treatment, and any further evaluation and other management    Signed electronically by: Stefano Redgie Butts, MD  [facility] Endocrinology  [facility] [address] [place], [place] [address] Phone: [phone] FAX: [phone]   CC: Lizette Macario BRAVO, NP (Inactive) [address] [place] [address] Phone: [phone]  Fax: [phone]    Return to Endocrinology clinic as below: Future Appointments  Date Time Provider Department Center  03/11/2024 11:45 AM Odean Potts, MD [facility] None

## 2023-12-05 DIAGNOSIS — E785 Hyperlipidemia, unspecified: Secondary | ICD-10-CM | POA: Diagnosis not present

## 2023-12-05 DIAGNOSIS — E871 Hypo-osmolality and hyponatremia: Secondary | ICD-10-CM | POA: Diagnosis not present

## 2023-12-05 DIAGNOSIS — I10 Essential (primary) hypertension: Secondary | ICD-10-CM | POA: Diagnosis not present

## 2023-12-05 DIAGNOSIS — G8929 Other chronic pain: Secondary | ICD-10-CM | POA: Diagnosis not present

## 2023-12-05 DIAGNOSIS — E109 Type 1 diabetes mellitus without complications: Secondary | ICD-10-CM | POA: Diagnosis not present

## 2023-12-05 DIAGNOSIS — M549 Dorsalgia, unspecified: Secondary | ICD-10-CM | POA: Diagnosis not present

## 2023-12-08 DIAGNOSIS — E109 Type 1 diabetes mellitus without complications: Secondary | ICD-10-CM | POA: Diagnosis not present

## 2023-12-08 DIAGNOSIS — I10 Essential (primary) hypertension: Secondary | ICD-10-CM | POA: Diagnosis not present

## 2023-12-08 DIAGNOSIS — M549 Dorsalgia, unspecified: Secondary | ICD-10-CM | POA: Diagnosis not present

## 2023-12-08 DIAGNOSIS — G8929 Other chronic pain: Secondary | ICD-10-CM | POA: Diagnosis not present

## 2023-12-08 DIAGNOSIS — E871 Hypo-osmolality and hyponatremia: Secondary | ICD-10-CM | POA: Diagnosis not present

## 2023-12-08 DIAGNOSIS — E785 Hyperlipidemia, unspecified: Secondary | ICD-10-CM | POA: Diagnosis not present

## 2023-12-21 DIAGNOSIS — I10 Essential (primary) hypertension: Secondary | ICD-10-CM | POA: Diagnosis not present

## 2023-12-21 DIAGNOSIS — G8929 Other chronic pain: Secondary | ICD-10-CM | POA: Diagnosis not present

## 2023-12-21 DIAGNOSIS — E785 Hyperlipidemia, unspecified: Secondary | ICD-10-CM | POA: Diagnosis not present

## 2023-12-21 DIAGNOSIS — M549 Dorsalgia, unspecified: Secondary | ICD-10-CM | POA: Diagnosis not present

## 2023-12-21 DIAGNOSIS — E871 Hypo-osmolality and hyponatremia: Secondary | ICD-10-CM | POA: Diagnosis not present

## 2023-12-21 DIAGNOSIS — E109 Type 1 diabetes mellitus without complications: Secondary | ICD-10-CM | POA: Diagnosis not present

## 2023-12-22 DIAGNOSIS — H532 Diplopia: Secondary | ICD-10-CM | POA: Diagnosis not present

## 2023-12-22 DIAGNOSIS — E083513 Diabetes mellitus due to underlying condition with proliferative diabetic retinopathy with macular edema, bilateral: Secondary | ICD-10-CM | POA: Diagnosis not present

## 2023-12-22 DIAGNOSIS — E113513 Type 2 diabetes mellitus with proliferative diabetic retinopathy with macular edema, bilateral: Secondary | ICD-10-CM | POA: Diagnosis not present

## 2023-12-22 DIAGNOSIS — H3582 Retinal ischemia: Secondary | ICD-10-CM | POA: Diagnosis not present

## 2023-12-22 DIAGNOSIS — Z961 Presence of intraocular lens: Secondary | ICD-10-CM | POA: Diagnosis not present

## 2023-12-25 DIAGNOSIS — E109 Type 1 diabetes mellitus without complications: Secondary | ICD-10-CM | POA: Diagnosis not present

## 2023-12-25 DIAGNOSIS — F419 Anxiety disorder, unspecified: Secondary | ICD-10-CM | POA: Diagnosis not present

## 2023-12-25 DIAGNOSIS — I10 Essential (primary) hypertension: Secondary | ICD-10-CM | POA: Diagnosis not present

## 2023-12-25 DIAGNOSIS — Z7982 Long term (current) use of aspirin: Secondary | ICD-10-CM | POA: Diagnosis not present

## 2023-12-25 DIAGNOSIS — M549 Dorsalgia, unspecified: Secondary | ICD-10-CM | POA: Diagnosis not present

## 2023-12-25 DIAGNOSIS — Z8673 Personal history of transient ischemic attack (TIA), and cerebral infarction without residual deficits: Secondary | ICD-10-CM | POA: Diagnosis not present

## 2023-12-25 DIAGNOSIS — Z556 Problems related to health literacy: Secondary | ICD-10-CM | POA: Diagnosis not present

## 2023-12-25 DIAGNOSIS — F32A Depression, unspecified: Secondary | ICD-10-CM | POA: Diagnosis not present

## 2023-12-25 DIAGNOSIS — G8929 Other chronic pain: Secondary | ICD-10-CM | POA: Diagnosis not present

## 2023-12-25 DIAGNOSIS — E785 Hyperlipidemia, unspecified: Secondary | ICD-10-CM | POA: Diagnosis not present

## 2023-12-25 DIAGNOSIS — E871 Hypo-osmolality and hyponatremia: Secondary | ICD-10-CM | POA: Diagnosis not present

## 2023-12-29 DIAGNOSIS — G8929 Other chronic pain: Secondary | ICD-10-CM | POA: Diagnosis not present

## 2023-12-29 DIAGNOSIS — I10 Essential (primary) hypertension: Secondary | ICD-10-CM | POA: Diagnosis not present

## 2023-12-29 DIAGNOSIS — F419 Anxiety disorder, unspecified: Secondary | ICD-10-CM | POA: Diagnosis not present

## 2023-12-29 DIAGNOSIS — F32A Depression, unspecified: Secondary | ICD-10-CM | POA: Diagnosis not present

## 2023-12-29 DIAGNOSIS — E871 Hypo-osmolality and hyponatremia: Secondary | ICD-10-CM | POA: Diagnosis not present

## 2023-12-29 DIAGNOSIS — E109 Type 1 diabetes mellitus without complications: Secondary | ICD-10-CM | POA: Diagnosis not present

## 2024-01-01 ENCOUNTER — Ambulatory Visit: Admitting: Dietician

## 2024-01-01 DIAGNOSIS — Z1211 Encounter for screening for malignant neoplasm of colon: Secondary | ICD-10-CM | POA: Diagnosis not present

## 2024-01-01 DIAGNOSIS — E1143 Type 2 diabetes mellitus with diabetic autonomic (poly)neuropathy: Secondary | ICD-10-CM | POA: Diagnosis not present

## 2024-01-01 DIAGNOSIS — K529 Noninfective gastroenteritis and colitis, unspecified: Secondary | ICD-10-CM | POA: Diagnosis not present

## 2024-01-01 DIAGNOSIS — K219 Gastro-esophageal reflux disease without esophagitis: Secondary | ICD-10-CM | POA: Diagnosis not present

## 2024-01-01 DIAGNOSIS — R933 Abnormal findings on diagnostic imaging of other parts of digestive tract: Secondary | ICD-10-CM | POA: Diagnosis not present

## 2024-01-01 DIAGNOSIS — R109 Unspecified abdominal pain: Secondary | ICD-10-CM | POA: Diagnosis not present

## 2024-01-01 DIAGNOSIS — K3184 Gastroparesis: Secondary | ICD-10-CM | POA: Diagnosis not present

## 2024-01-01 DIAGNOSIS — R1319 Other dysphagia: Secondary | ICD-10-CM | POA: Diagnosis not present

## 2024-01-01 DIAGNOSIS — R197 Diarrhea, unspecified: Secondary | ICD-10-CM | POA: Diagnosis not present

## 2024-01-03 DIAGNOSIS — E1143 Type 2 diabetes mellitus with diabetic autonomic (poly)neuropathy: Secondary | ICD-10-CM | POA: Diagnosis not present

## 2024-01-03 DIAGNOSIS — D649 Anemia, unspecified: Secondary | ICD-10-CM | POA: Diagnosis not present

## 2024-01-03 DIAGNOSIS — E871 Hypo-osmolality and hyponatremia: Secondary | ICD-10-CM | POA: Diagnosis not present

## 2024-01-03 DIAGNOSIS — D75839 Thrombocytosis, unspecified: Secondary | ICD-10-CM | POA: Diagnosis not present

## 2024-01-03 DIAGNOSIS — K3184 Gastroparesis: Secondary | ICD-10-CM | POA: Diagnosis not present

## 2024-01-03 DIAGNOSIS — Z79899 Other long term (current) drug therapy: Secondary | ICD-10-CM | POA: Diagnosis not present

## 2024-01-03 DIAGNOSIS — H6121 Impacted cerumen, right ear: Secondary | ICD-10-CM | POA: Diagnosis not present

## 2024-01-03 DIAGNOSIS — I10 Essential (primary) hypertension: Secondary | ICD-10-CM | POA: Diagnosis not present

## 2024-01-03 DIAGNOSIS — F988 Other specified behavioral and emotional disorders with onset usually occurring in childhood and adolescence: Secondary | ICD-10-CM | POA: Diagnosis not present

## 2024-01-03 DIAGNOSIS — F339 Major depressive disorder, recurrent, unspecified: Secondary | ICD-10-CM | POA: Diagnosis not present

## 2024-01-03 DIAGNOSIS — E782 Mixed hyperlipidemia: Secondary | ICD-10-CM | POA: Diagnosis not present

## 2024-01-03 DIAGNOSIS — E1040 Type 1 diabetes mellitus with diabetic neuropathy, unspecified: Secondary | ICD-10-CM | POA: Diagnosis not present

## 2024-01-04 DIAGNOSIS — F419 Anxiety disorder, unspecified: Secondary | ICD-10-CM | POA: Diagnosis not present

## 2024-01-04 DIAGNOSIS — F32A Depression, unspecified: Secondary | ICD-10-CM | POA: Diagnosis not present

## 2024-01-04 DIAGNOSIS — E109 Type 1 diabetes mellitus without complications: Secondary | ICD-10-CM | POA: Diagnosis not present

## 2024-01-04 DIAGNOSIS — G8929 Other chronic pain: Secondary | ICD-10-CM | POA: Diagnosis not present

## 2024-01-04 DIAGNOSIS — I10 Essential (primary) hypertension: Secondary | ICD-10-CM | POA: Diagnosis not present

## 2024-01-04 DIAGNOSIS — E871 Hypo-osmolality and hyponatremia: Secondary | ICD-10-CM | POA: Diagnosis not present

## 2024-01-05 DIAGNOSIS — F419 Anxiety disorder, unspecified: Secondary | ICD-10-CM | POA: Diagnosis not present

## 2024-01-05 DIAGNOSIS — I10 Essential (primary) hypertension: Secondary | ICD-10-CM | POA: Diagnosis not present

## 2024-01-05 DIAGNOSIS — F32A Depression, unspecified: Secondary | ICD-10-CM | POA: Diagnosis not present

## 2024-01-05 DIAGNOSIS — E109 Type 1 diabetes mellitus without complications: Secondary | ICD-10-CM | POA: Diagnosis not present

## 2024-01-05 DIAGNOSIS — G8929 Other chronic pain: Secondary | ICD-10-CM | POA: Diagnosis not present

## 2024-01-05 DIAGNOSIS — E871 Hypo-osmolality and hyponatremia: Secondary | ICD-10-CM | POA: Diagnosis not present

## 2024-01-08 DIAGNOSIS — D869 Sarcoidosis, unspecified: Secondary | ICD-10-CM | POA: Diagnosis not present

## 2024-01-08 DIAGNOSIS — I517 Cardiomegaly: Secondary | ICD-10-CM | POA: Diagnosis not present

## 2024-01-08 DIAGNOSIS — I071 Rheumatic tricuspid insufficiency: Secondary | ICD-10-CM | POA: Diagnosis not present

## 2024-01-08 DIAGNOSIS — I340 Nonrheumatic mitral (valve) insufficiency: Secondary | ICD-10-CM | POA: Diagnosis not present

## 2024-01-09 DIAGNOSIS — E559 Vitamin D deficiency, unspecified: Secondary | ICD-10-CM | POA: Diagnosis not present

## 2024-01-09 DIAGNOSIS — D75839 Thrombocytosis, unspecified: Secondary | ICD-10-CM | POA: Diagnosis not present

## 2024-01-09 DIAGNOSIS — E782 Mixed hyperlipidemia: Secondary | ICD-10-CM | POA: Diagnosis not present

## 2024-01-09 DIAGNOSIS — E871 Hypo-osmolality and hyponatremia: Secondary | ICD-10-CM | POA: Diagnosis not present

## 2024-01-16 DIAGNOSIS — R2681 Unsteadiness on feet: Secondary | ICD-10-CM | POA: Diagnosis not present

## 2024-01-23 DIAGNOSIS — H35353 Cystoid macular degeneration, bilateral: Secondary | ICD-10-CM | POA: Diagnosis not present

## 2024-01-23 DIAGNOSIS — E113513 Type 2 diabetes mellitus with proliferative diabetic retinopathy with macular edema, bilateral: Secondary | ICD-10-CM | POA: Diagnosis not present

## 2024-01-23 DIAGNOSIS — H3589 Other specified retinal disorders: Secondary | ICD-10-CM | POA: Diagnosis not present

## 2024-01-23 DIAGNOSIS — R2681 Unsteadiness on feet: Secondary | ICD-10-CM | POA: Diagnosis not present

## 2024-01-23 DIAGNOSIS — Z961 Presence of intraocular lens: Secondary | ICD-10-CM | POA: Diagnosis not present

## 2024-01-25 DIAGNOSIS — R2681 Unsteadiness on feet: Secondary | ICD-10-CM | POA: Diagnosis not present

## 2024-01-30 DIAGNOSIS — R2681 Unsteadiness on feet: Secondary | ICD-10-CM | POA: Diagnosis not present

## 2024-02-01 DIAGNOSIS — R2681 Unsteadiness on feet: Secondary | ICD-10-CM | POA: Diagnosis not present

## 2024-02-01 DIAGNOSIS — R220 Localized swelling, mass and lump, head: Secondary | ICD-10-CM | POA: Diagnosis not present

## 2024-02-01 DIAGNOSIS — L089 Local infection of the skin and subcutaneous tissue, unspecified: Secondary | ICD-10-CM | POA: Diagnosis not present

## 2024-02-01 DIAGNOSIS — H9201 Otalgia, right ear: Secondary | ICD-10-CM | POA: Diagnosis not present

## 2024-02-08 DIAGNOSIS — R2681 Unsteadiness on feet: Secondary | ICD-10-CM | POA: Diagnosis not present

## 2024-02-13 DIAGNOSIS — R2681 Unsteadiness on feet: Secondary | ICD-10-CM | POA: Diagnosis not present

## 2024-02-15 DIAGNOSIS — R2681 Unsteadiness on feet: Secondary | ICD-10-CM | POA: Diagnosis not present

## 2024-02-20 DIAGNOSIS — H35353 Cystoid macular degeneration, bilateral: Secondary | ICD-10-CM | POA: Diagnosis not present

## 2024-02-20 DIAGNOSIS — E113513 Type 2 diabetes mellitus with proliferative diabetic retinopathy with macular edema, bilateral: Secondary | ICD-10-CM | POA: Diagnosis not present

## 2024-02-20 DIAGNOSIS — R2681 Unsteadiness on feet: Secondary | ICD-10-CM | POA: Diagnosis not present

## 2024-02-21 DIAGNOSIS — R2681 Unsteadiness on feet: Secondary | ICD-10-CM | POA: Diagnosis not present

## 2024-02-27 DIAGNOSIS — R2681 Unsteadiness on feet: Secondary | ICD-10-CM | POA: Diagnosis not present

## 2024-03-05 DIAGNOSIS — R2681 Unsteadiness on feet: Secondary | ICD-10-CM | POA: Diagnosis not present

## 2024-03-07 DIAGNOSIS — R2681 Unsteadiness on feet: Secondary | ICD-10-CM | POA: Diagnosis not present

## 2024-03-08 DIAGNOSIS — R21 Rash and other nonspecific skin eruption: Secondary | ICD-10-CM | POA: Diagnosis not present

## 2024-03-08 DIAGNOSIS — H9203 Otalgia, bilateral: Secondary | ICD-10-CM | POA: Diagnosis not present

## 2024-03-08 DIAGNOSIS — H608X2 Other otitis externa, left ear: Secondary | ICD-10-CM | POA: Diagnosis not present

## 2024-03-11 ENCOUNTER — Inpatient Hospital Stay: Payer: Medicare Other | Attending: Hematology and Oncology | Admitting: Hematology and Oncology

## 2024-03-11 NOTE — Assessment & Plan Note (Deleted)
 Lab review  02/03/2021: Platelets 588, hemoglobin 12.4: WBC 11.2 06/29/2020: Platelets 540, hemoglobin 11.3, white count 10.9 10/30/2020: Platelets 508, hemoglobin 11, WBC 8.6 06/02/2021: WBC 19, platelets 582, ANC 14.4  06/24/2021: WBC 11.2, hemoglobin 12.1, platelets 539, JAK2 and MPN panel: Negative, flow cytometry: No lymphocyte abnormal subsets 02/01/2022: WBC 11.2, hemoglobin 12.1, platelets 539 11/05/2023: Hemoglobin 10.9, platelets 448   I discussed with the patient that it is most like related to underlying inflammation as result of sarcoidosis.    Return to clinic in 1 year for labs and follow-up

## 2024-03-14 DIAGNOSIS — R2681 Unsteadiness on feet: Secondary | ICD-10-CM | POA: Diagnosis not present

## 2024-04-01 ENCOUNTER — Ambulatory Visit: Admitting: Internal Medicine

## 2024-04-01 NOTE — Progress Notes (Deleted)
 " Name: Jill [name]  MRN/ DOB: [id number], [birth year]   Age/ Sex: 59 y.o., female    PCP: Lizette Macario BRAVO, NP (Inactive)   Reason for Endocrinology Evaluation: Type 1 Diabetes Mellitus     Date of Initial Endocrinology Visit: 02/04/2022    PATIENT IDENTIFIER: Jill [name] is a 58 y.o. female with a past medical history of HTN,sarcoidosis, DM and dyslipidemia. The patient presented for initial endocrinology clinic visit on 02/04/2022 for consultative assistance with her diabetes management.    HPI: Jill [name] was    Diagnosed with DM at age 54 Prior Medications tried/Intolerance: Semlyn cost-prohibited  Hemoglobin A1c has ranged from 7.3% in 2008, peaking at 13.3% in 2023.  On her initial visit to our clinic she had an A1c of 13.3%, she was unclear whether she was diagnosed with type I versus type 2 diabetes, but phenotypically she was more towards type I.  She was on metformin , and insulin  mix.  We decreased metformin  by 50% due to vomiting that she attributed to metformin , we started her on NPH and regular insulin  as well as correction scale, due to insurance issues, insulin  analogs were cost prohibitive, hence we kept her on NPH and regular   She contacted our office 08/2023 requesting switching metformin  tablets to liquid   We switched NPH to insulin  analog June, 2025  Metformin  was discontinued by August, 2025 for hospitalization   The patient was started on Admelog  through patient assistance program through [facility]  SUBJECTIVE:   During the last visit (12/01/2023): A1c 7.9%  Today (04/01/2024): [name] is here for follow-up on diabetes management.  She checks her glucose multiple times daily through freestyle [place], she has been noted with low BG readings on freestyle libre.  She is accompanied by her spouse Jill [name]  She has established care with GI for history of colitis in October, 2025.  This was suspected to be infectious,  colonoscopy was recommended, she was started on PPI for GERD  She underwent physical therapy for unsteady gait She continues to follow-up with ophthalmology for bilateral proliferative DR   She was evaluated by podiatry 07/16/2023  No recent nausea or vomiting  She does have diarrhea at night , took imodium on the past week    HOME DIABETES REGIMEN: Metformin  500 mg liquid 5 mL BID   - not taking  Lantus  20 units daily Admelog  6 units 3 times daily Correction scale: Admelog   (BG -130/50)    Statin: yes ACE-I/ARB: yes    CONTINUOUS GLUCOSE MONITORING RECORD INTERPRETATION    Dates of Recording: 8/23 - 12/01/2023  Sensor description: Freestyle libre 3+  Results statistics:   CGM use % of time 96%  Average and SD 214/34.6  Time in range     34   %  % Time Above 180 36  % Time above 250 29  % Time Below target 1   Glycemic patterns summary: BGs trend down overnight and fluctuate throughout the day  Hyperglycemic episodes all day and at times during the night as well  Hypoglycemic episodes occurred overnight  Overnight periods: Variable     DIABETIC COMPLICATIONS: Microvascular complications:  DR with macular edema stable ( legally blind) , neuropathy Denies: CKD Last eye exam: Completed 11/29/2022  Macrovascular complications:  CVA Denies: CAD, PVD   PAST HISTORY: Past Medical History:  Past Medical History:  Diagnosis Date   ADHD    Arthralgia    Depression    Diabetes mellitus  without complication (HCC)    Diabetic retinopathy (HCC)    Glaucoma    Hyperlipidemia    Hypertension    Macular edema    Visual impairment    Past Surgical History:  Past Surgical History:  Procedure Laterality Date   CATARACT EXTRACTION     left eye   CESAREAN SECTION     left foot surgery      Social History:  reports that she has never smoked. She has never used smokeless tobacco. She reports that she does not currently use alcohol. She reports that she does not  currently use drugs. Family History:  Family History  Problem Relation Age of Onset   Stroke Father    Stroke Paternal Grandfather      HOME MEDICATIONS: Allergies as of 04/01/2024       Reactions   Hyoscyamine Sulfate Hives   Phenobarbital Hives   Scopolamine Hives   Pb-hyoscy-atropine-scopolamine    Canagliflozin Rash   Latex Rash   Blistery rash   Phenobarbital-belladonna Alk Rash   Sulfamethoxazole Itching        Medication List        Accurate as of April 01, 2024  7:33 AM. If you have any questions, ask your nurse or doctor.          albuterol 108 (90 Base) MCG/ACT inhaler Commonly known as: VENTOLIN HFA Inhale 1-2 puffs into the lungs every 6 (six) hours as needed for wheezing or shortness of breath.   amoxicillin-clavulanate 875-125 MG tablet Commonly known as: AUGMENTIN Take 1 tablet by mouth.   amphetamine-dextroamphetamine 20 MG tablet Commonly known as: ADDERALL Take 20 mg by mouth 2 (two) times daily.   artificial tears ophthalmic solution Place 1 drop into both eyes 2 (two) times daily as needed for dry eyes.   aspirin EC 81 MG tablet Take 81 mg by mouth daily.   Azelastine HCl 137 MCG/SPRAY Soln Place 1 spray into the nose.   buPROPion 150 MG 12 hr tablet Commonly known as: WELLBUTRIN SR Take 150 mg by mouth 2 (two) times daily.   buPROPion 300 MG 24 hr tablet Commonly known as: WELLBUTRIN XL Take 300 mg by mouth every morning.   diclofenac 75 MG EC tablet Commonly known as: VOLTAREN Take 75 mg by mouth.   DULoxetine 60 MG capsule Commonly known as: CYMBALTA Take 60 mg by mouth 2 (two) times daily.   fexofenadine 180 MG tablet Commonly known as: ALLEGRA Take 180 mg by mouth daily.   hydrOXYzine 25 MG tablet Commonly known as: ATARAX Take 25 mg by mouth 3 (three) times daily.   insulin  lispro 100 UNIT/ML KwikPen Commonly known as: Admelog  SoloStar Max daily 30 units   Insulin  Pen Needle 32G X 4 MM Misc 1 Device by Does  not apply route in the morning, at noon, in the evening, and at bedtime.   Lantus  SoloStar 100 UNIT/ML Solostar Pen Generic drug: insulin  glargine Inject 20 Units into the skin daily.   lisinopril-hydrochlorothiazide 20-25 MG tablet Commonly known as: ZESTORETIC Take 2 tablets by mouth at bedtime.   lisinopril-hydrochlorothiazide 20-12.5 MG tablet Commonly known as: ZESTORETIC Take 2 tablets by mouth daily.   meclizine 25 MG tablet Commonly known as: ANTIVERT Take 25 mg by mouth every 6 (six) hours as needed.   omeprazole 40 MG capsule Commonly known as: PRILOSEC Take 80 mg by mouth.   Oxcarbazepine  300 MG tablet Commonly known as: Trileptal  Take 1 tablet (300 mg total) by mouth daily.  promethazine  6.25 MG/5ML solution Commonly known as: PHENERGAN  Take 25 mg by mouth.   rosuvastatin 20 MG tablet Commonly known as: CRESTOR Take 20 mg by mouth daily.   valACYclovir  500 MG tablet Commonly known as: VALTREX  Take 1 tablet (500 mg total) by mouth 3 (three) times daily.         ALLERGIES: Allergies  Allergen Reactions   Hyoscyamine Sulfate Hives   Phenobarbital Hives   Scopolamine Hives   Pb-Hyoscy-Atropine-Scopolamine    Canagliflozin Rash   Latex Rash    Blistery rash   Phenobarbital-Belladonna Alk Rash   Sulfamethoxazole Itching     REVIEW OF SYSTEMS: A comprehensive ROS was conducted with the patient and is negative except as per HPI   OBJECTIVE:   VITAL SIGNS: There were no vitals taken for this visit.   PHYSICAL EXAM:  General: Pt appears well and is in NAD  Lungs: Clear with good BS bilat   Heart: RRR   Extremities:  Lower extremities - No pretibial edema  Neuro: MS is good with appropriate affect, pt is alert and Ox3   DM Foot Exam 07/20/2023 per podiatry    DATA REVIEWED:  Lab Results  Component Value Date   HGBA1C 7.9 (A) 12/01/2023   HGBA1C 10.8 (A) 08/30/2023   HGBA1C 9.1 (A) 06/08/2022   11/05/2023 @[facility]  Everywhere Sodium  136 Potassium 3.9 BUN 25 CR 1.17 GFR 55   10/17/2023 TSH 1.890   Old records , labs and images have been reviewed.    ASSESSMENT / PLAN / RECOMMENDATIONS:   1) Type 1 Diabetes Mellitus, Sub- Optimally controlled, With Retinopathic, neuropathic and macrovascular complications - Most recent A1c of 7.9%. Goal A1c < 7.0 %.    -A1c has trended down from 10.8% to 7.9% -She does not like the Dexcom - She is currently on Lantus  and Admelog  through patient assistance program that was initiated through [facility] -The patient has been using Admelog  with each meal but has been using Novolin -R for correction.  I suspect this is the reason for hypoglycemia -Barriers to diabetes self-care is cognitive impairment -Metformin  was discontinued during hospitalization, which I agree with given recent GI symptoms MEDICATIONS:   Continue Lantus  20 units daily  Increase Admelog  6 units with each meal Continue correction factor: Admelog  (BG -130/50) TIDQAC  EDUCATION / INSTRUCTIONS: BG monitoring instructions: Patient is instructed to check her blood sugars 3 times a day, before meals . Call [facility] if: BG persistently < 70  I reviewed the Rule of 15 for the treatment of hypoglycemia in detail with the patient. Literature supplied.   2) Diabetic complications:  Eye: Does  have known diabetic retinopathy.  Neuro/ Feet: Does  have known diabetic peripheral neuropathy. Renal: Patient does not have known baseline CKD. She is  on an ACEI/ARB at present.   3) Dyslipidemia :   - Historically LDL acceptable in the past, we will continue to monitor Pt on rosuvastatin 20 mg daily   Follow-up in 4 months    Signed electronically by: Stefano Redgie Butts, MD  [facility] Endocrinology  [facility] [address] [place] [place], [place] [address] Phone: [phone] FAX: [phone]   CC: Lizette Macario BRAVO, NP (Inactive) [address]  [place] [address] Phone: [phone]  Fax: [phone]    Return to Endocrinology clinic as below: Future Appointments  Date Time Provider Department Center  04/01/2024 10:50 AM Braelon Sprung, Donell Redgie, MD [facility] None    "

## 2024-04-12 ENCOUNTER — Telehealth: Payer: Self-pay | Admitting: Internal Medicine

## 2024-04-12 MED ORDER — LANTUS SOLOSTAR 100 UNIT/ML ~~LOC~~ SOPN: 20.0000 [IU] | PEN_INJECTOR | Freq: Every day | SUBCUTANEOUS | 3 refills | Status: AC

## 2024-04-12 MED ORDER — INSULIN LISPRO (1 UNIT DIAL) 100 UNIT/ML (KWIKPEN): PEN_INJECTOR | SUBCUTANEOUS | 3 refills | Status: AC

## 2024-04-12 NOTE — Telephone Encounter (Signed)
 Refill sent.  Patient should keep upcoming appointment.

## 2024-04-12 NOTE — Telephone Encounter (Signed)
 MEDICATION:  1)  Lantus  SoloStar insulin  glargine (LANTUS  SOLOSTAR) 100 UNIT/ML Solostar Pen  2)  insulin  lispro insulin  lispro (ADMELOG  SOLOSTAR) 100 UNIT/ML KwikPen  PHARMACY:  [address], [place], [place] [address] [phone]  HAS THE PATIENT CONTACTED THEIR PHARMACY?  Yes  LAST REFILL:  @@LASTREFILL @  IS THIS A 90 DAY SUPPLY : Yes  IS PATIENT OUT OF MEDICATION: No  IF NOT; HOW MUCH IS LEFT: Not sure  LAST APPOINTMENT DATE: @9 /07/2023  NEXT APPOINTMENT DATE:@1 /19/2026  DO WE HAVE YOUR PERMISSION TO LEAVE A DETAILED MESSAGE?: Yes  OTHER COMMENTS:    **Let patient know to contact pharmacy at the end of the day to make sure medication is ready. **  ** Please notify patient to allow 48-72 hours to process**  **Encourage patient to contact the pharmacy for refills or they can request refills through [facility]**

## 2024-04-15 ENCOUNTER — Ambulatory Visit: Admitting: Internal Medicine

## 2024-04-15 NOTE — Progress Notes (Unsigned)
 " Name: Jill [name]  MRN/ DOB: [id number], Sep 24, 1965   Age/ Sex: 59 y.o., female    PCP: Lizette Macario BRAVO, NP (Inactive)   Reason for Endocrinology Evaluation: Type 1 Diabetes Mellitus     Date of Initial Endocrinology Visit: 02/04/2022    PATIENT IDENTIFIER: Jill [name] is a 59 y.o. female with a past medical history of HTN,sarcoidosis, DM and dyslipidemia. The patient presented for initial endocrinology clinic visit on 02/04/2022 for consultative assistance with her diabetes management.    HPI: Jill [name] was    Diagnosed with DM at age 48 Prior Medications tried/Intolerance: Semlyn cost-prohibited  Hemoglobin A1c has ranged from 7.3% in 2008, peaking at 13.3% in 2023.  On her initial visit to our clinic she had an A1c of 13.3%, she was unclear whether she was diagnosed with type I versus type 2 diabetes, but phenotypically she was more towards type I.  She was on metformin , and insulin  mix.  We decreased metformin  by 50% due to vomiting that she attributed to metformin , we started her on NPH and regular insulin  as well as correction scale, due to insurance issues, insulin  analogs were cost prohibitive, hence we kept her on NPH and regular   She contacted our office 08/2023 requesting switching metformin  tablets to liquid   We switched NPH to insulin  analog June, 2025  Metformin  was discontinued by August, 2025 for hospitalization   The patient was started on Admelog  through patient assistance program through [facility]  SUBJECTIVE:   During the last visit (12/01/2023): A1c 7.9%  Today (04/15/24): Jill [name] is here for follow-up on diabetes management.  She checks her glucose multiple times daily through freestyle [place], she has been noted with low BG readings on freestyle libre.  She is accompanied by her spouse [place]  She has established care with GI for history of colitis in October, 2025.  This was suspected to be infectious,  colonoscopy was recommended, she was started on PPI for GERD  She underwent physical therapy for unsteady gait She continues to follow-up with ophthalmology for bilateral proliferative DR   She was evaluated by podiatry 07/16/2023  No recent nausea or vomiting  She does have diarrhea at night , took imodium on the past week    HOME DIABETES REGIMEN: Metformin  500 mg liquid 5 mL BID   - not taking  Lantus  20 units daily Admelog  6 units 3 times daily Correction scale: Admelog   (BG -130/50)    Statin: yes ACE-I/ARB: yes    CONTINUOUS GLUCOSE MONITORING RECORD INTERPRETATION    Dates of Recording: 8/23 - 12/01/2023  Sensor description: Freestyle libre 3+  Results statistics:   CGM use % of time 96%  Average and SD 214/34.6  Time in range     34   %  % Time Above 180 36  % Time above 250 29  % Time Below target 1   Glycemic patterns summary: BGs trend down overnight and fluctuate throughout the day  Hyperglycemic episodes all day and at times during the night as well  Hypoglycemic episodes occurred overnight  Overnight periods: Variable     DIABETIC COMPLICATIONS: Microvascular complications:  DR with macular edema stable ( legally blind) , neuropathy Denies: CKD Last eye exam: Completed 11/29/2022  Macrovascular complications:  CVA Denies: CAD, PVD   PAST HISTORY: Past Medical History:  Past Medical History:  Diagnosis Date   ADHD    Arthralgia    Depression    Diabetes mellitus  without complication (HCC)    Diabetic retinopathy (HCC)    Glaucoma    Hyperlipidemia    Hypertension    Macular edema    Visual impairment    Past Surgical History:  Past Surgical History:  Procedure Laterality Date   CATARACT EXTRACTION     left eye   CESAREAN SECTION     left foot surgery      Social History:  reports that she has never smoked. She has never used smokeless tobacco. She reports that she does not currently use alcohol. She reports that she does not  currently use drugs. Family History:  Family History  Problem Relation Age of Onset   Stroke Father    Stroke Paternal Grandfather      HOME MEDICATIONS: Allergies as of 04/15/2024       Reactions   Hyoscyamine Sulfate Hives   Phenobarbital Hives   Scopolamine Hives   Pb-hyoscy-atropine-scopolamine    Canagliflozin Rash   Latex Rash   Blistery rash   Phenobarbital-belladonna Alk Rash   Sulfamethoxazole Itching        Medication List        Accurate as of April 15, 2024  7:08 AM. If you have any questions, ask your nurse or doctor.          albuterol 108 (90 Base) MCG/ACT inhaler Commonly known as: VENTOLIN HFA Inhale 1-2 puffs into the lungs every 6 (six) hours as needed for wheezing or shortness of breath.   amoxicillin-clavulanate 875-125 MG tablet Commonly known as: AUGMENTIN Take 1 tablet by mouth.   amphetamine-dextroamphetamine 20 MG tablet Commonly known as: ADDERALL Take 20 mg by mouth 2 (two) times daily.   artificial tears ophthalmic solution Place 1 drop into both eyes 2 (two) times daily as needed for dry eyes.   aspirin EC 81 MG tablet Take 81 mg by mouth daily.   Azelastine HCl 137 MCG/SPRAY Soln Place 1 spray into the nose.   buPROPion 150 MG 12 hr tablet Commonly known as: WELLBUTRIN SR Take 150 mg by mouth 2 (two) times daily.   buPROPion 300 MG 24 hr tablet Commonly known as: WELLBUTRIN XL Take 300 mg by mouth every morning.   diclofenac 75 MG EC tablet Commonly known as: VOLTAREN Take 75 mg by mouth.   DULoxetine 60 MG capsule Commonly known as: CYMBALTA Take 60 mg by mouth 2 (two) times daily.   fexofenadine 180 MG tablet Commonly known as: ALLEGRA Take 180 mg by mouth daily.   hydrOXYzine 25 MG tablet Commonly known as: ATARAX Take 25 mg by mouth 3 (three) times daily.   insulin  lispro 100 UNIT/ML KwikPen Commonly known as: Admelog  SoloStar Max daily 30 units   Insulin  Pen Needle 32G X 4 MM Misc 1 Device by  Does not apply route in the morning, at noon, in the evening, and at bedtime.   Lantus  SoloStar 100 UNIT/ML Solostar Pen Generic drug: insulin  glargine Inject 20 Units into the skin daily.   lisinopril-hydrochlorothiazide 20-25 MG tablet Commonly known as: ZESTORETIC Take 2 tablets by mouth at bedtime.   lisinopril-hydrochlorothiazide 20-12.5 MG tablet Commonly known as: ZESTORETIC Take 2 tablets by mouth daily.   meclizine 25 MG tablet Commonly known as: ANTIVERT Take 25 mg by mouth every 6 (six) hours as needed.   omeprazole 40 MG capsule Commonly known as: PRILOSEC Take 80 mg by mouth.   Oxcarbazepine  300 MG tablet Commonly known as: Trileptal  Take 1 tablet (300 mg total) by mouth daily.  promethazine  6.25 MG/5ML solution Commonly known as: PHENERGAN  Take 25 mg by mouth.   rosuvastatin 20 MG tablet Commonly known as: CRESTOR Take 20 mg by mouth daily.   valACYclovir  500 MG tablet Commonly known as: VALTREX  Take 1 tablet (500 mg total) by mouth 3 (three) times daily.         ALLERGIES: Allergies  Allergen Reactions   Hyoscyamine Sulfate Hives   Phenobarbital Hives   Scopolamine Hives   Pb-Hyoscy-Atropine-Scopolamine    Canagliflozin Rash   Latex Rash    Blistery rash   Phenobarbital-Belladonna Alk Rash   Sulfamethoxazole Itching     REVIEW OF SYSTEMS: A comprehensive ROS was conducted with the patient and is negative except as per HPI   OBJECTIVE:   VITAL SIGNS: There were no vitals taken for this visit.   PHYSICAL EXAM:  General: Pt appears well and is in NAD  Lungs: Clear with good BS bilat   Heart: RRR   Extremities:  Lower extremities - No pretibial edema  Neuro: MS is good with appropriate affect, pt is alert and Ox3   DM Foot Exam 07/20/2023 per podiatry    DATA REVIEWED:  Lab Results  Component Value Date   HGBA1C 7.9 (A) 12/01/2023   HGBA1C 10.8 (A) 08/30/2023   HGBA1C 9.1 (A) 06/08/2022   11/05/2023 @[facility]   Everywhere Sodium 136 Potassium 3.9 BUN 25 CR 1.17 GFR 55   10/17/2023 TSH 1.890   Old records , labs and images have been reviewed.    ASSESSMENT / PLAN / RECOMMENDATIONS:   1) Type 1 Diabetes Mellitus, Sub- Optimally controlled, With Retinopathic, neuropathic and macrovascular complications - Most recent A1c of 7.9%. Goal A1c < 7.0 %.    -A1c has trended down from 10.8% to 7.9% -She does not like the Dexcom - She is currently on Lantus  and Admelog  through patient assistance program that was initiated through [facility] -The patient has been using Admelog  with each meal but has been using Novolin -R for correction.  I suspect this is the reason for hypoglycemia -Barriers to diabetes self-care is cognitive impairment -Metformin  was discontinued during hospitalization, which I agree with given recent GI symptoms MEDICATIONS:   Continue Lantus  20 units daily  Increase Admelog  6 units with each meal Continue correction factor: Admelog  (BG -130/50) TIDQAC  EDUCATION / INSTRUCTIONS: BG monitoring instructions: Patient is instructed to check her blood sugars 3 times a day, before meals . Call [facility] if: BG persistently < 70  I reviewed the Rule of 15 for the treatment of hypoglycemia in detail with the patient. Literature supplied.   2) Diabetic complications:  Eye: Does  have known diabetic retinopathy.  Neuro/ Feet: Does  have known diabetic peripheral neuropathy. Renal: Patient does not have known baseline CKD. She is  on an ACEI/ARB at present.   3) Dyslipidemia :   - Historically LDL acceptable in the past, we will continue to monitor Pt on rosuvastatin 20 mg daily   Follow-up in 4 months    Signed electronically by: Stefano Redgie Butts, MD  [facility] Endocrinology  [facility] [address] [place] [place], [place] [address] Phone: [phone] FAX: [phone]   CC: Lizette Macario BRAVO, NP (Inactive) [address] [place] [address] Phone: [phone]  Fax: [phone]    Return to Endocrinology clinic as below: Future Appointments  Date Time Provider Department Center  04/15/2024  9:30 AM Keylen Uzelac, Donell Redgie, MD [facility] None    "

## 2024-04-17 ENCOUNTER — Ambulatory Visit: Admitting: Internal Medicine

## 2024-04-17 ENCOUNTER — Telehealth: Payer: Self-pay | Admitting: Internal Medicine

## 2024-04-17 ENCOUNTER — Encounter: Payer: Self-pay | Admitting: Internal Medicine

## 2024-04-17 VITALS — BP 138/78 | Ht 61.0 in | Wt 162.0 lb

## 2024-04-17 DIAGNOSIS — E1059 Type 1 diabetes mellitus with other circulatory complications: Secondary | ICD-10-CM | POA: Diagnosis not present

## 2024-04-17 DIAGNOSIS — E103593 Type 1 diabetes mellitus with proliferative diabetic retinopathy without macular edema, bilateral: Secondary | ICD-10-CM | POA: Diagnosis not present

## 2024-04-17 DIAGNOSIS — E1042 Type 1 diabetes mellitus with diabetic polyneuropathy: Secondary | ICD-10-CM

## 2024-04-17 DIAGNOSIS — E785 Hyperlipidemia, unspecified: Secondary | ICD-10-CM

## 2024-04-17 DIAGNOSIS — E1065 Type 1 diabetes mellitus with hyperglycemia: Secondary | ICD-10-CM | POA: Diagnosis not present

## 2024-04-17 LAB — POCT GLYCOSYLATED HEMOGLOBIN (HGB A1C): Hemoglobin A1C: 9.8 % — AB (ref 4.0–5.6)

## 2024-04-17 NOTE — Telephone Encounter (Signed)
 Tandem order placed in parachute.

## 2024-04-17 NOTE — Progress Notes (Unsigned)
 " Name: Jill [name]  MRN/ DOB: [id number], [birth year]   Age/ Sex: 59 y.o., female    PCP: Lizette Macario BRAVO, NP (Inactive)   Reason for Endocrinology Evaluation: Type 1 Diabetes Mellitus     Date of Initial Endocrinology Visit: 02/04/2022    PATIENT IDENTIFIER: Jill [name] is a 59 y.o. female with a past medical history of HTN,sarcoidosis, DM and dyslipidemia. The patient presented for initial endocrinology clinic visit on 02/04/2022 for consultative assistance with her diabetes management.    HPI: Jill [name] was    Diagnosed with DM at age 47 Prior Medications tried/Intolerance: Semlyn cost-prohibited  Hemoglobin A1c has ranged from 7.3% in 2008, peaking at 13.3% in 2023.  On her initial visit to our clinic she had an A1c of 13.3%, she was unclear whether she was diagnosed with type I versus type 2 diabetes, but phenotypically she was more towards type I.  She was on metformin , and insulin  mix.  We decreased metformin  by 50% due to vomiting that she attributed to metformin , we started her on NPH and regular insulin  as well as correction scale, due to insurance issues, insulin  analogs were cost prohibitive, hence we kept her on NPH and regular   She contacted our office 08/2023 requesting switching metformin  tablets to liquid   We switched NPH to insulin  analog June, 2025  Metformin  was discontinued by August, 2025 for hospitalization   The patient was started on Admelog  through patient assistance program through [facility]  SUBJECTIVE:   During the last visit (12/01/2023): A1c 7.9%  Today (04/17/24): Jill [name] is here for follow-up on diabetes management.  She checks her glucose multiple times daily through freestyle [place], she has been noted with low BG readings on freestyle libre.   She recently finished prednisone for sarcoid  No nausea  No recent constipation or diarrhea   Patient continues to follow-up with ophthalmology for  diabetic retinopathy  She continues to undergo physical therapy for unsteady gait  She is out of Admelog  and is using humulin-R   HOME DIABETES REGIMEN: Lantus  20 units daily Admelog  8 units 3 times daily Correction scale: Admelog   (BG -130/50)    Statin: yes ACE-I/ARB: yes    CONTINUOUS GLUCOSE MONITORING RECORD INTERPRETATION     DIABETIC COMPLICATIONS: Microvascular complications:  DR with macular edema stable ( legally blind) , neuropathy Denies: CKD Last eye exam: Completed   Macrovascular complications:  CVA Denies: CAD, PVD   PAST HISTORY: Past Medical History:  Past Medical History:  Diagnosis Date   ADHD    Arthralgia    Depression    Diabetes mellitus without complication (HCC)    Diabetic retinopathy (HCC)    Glaucoma    Hyperlipidemia    Hypertension    Macular edema    Visual impairment    Past Surgical History:  Past Surgical History:  Procedure Laterality Date   CATARACT EXTRACTION     left eye   CESAREAN SECTION     left foot surgery      Social History:  reports that she has never smoked. She has never used smokeless tobacco. She reports that she does not currently use alcohol. She reports that she does not currently use drugs. Family History:  Family History  Problem Relation Age of Onset   Stroke Father    Stroke Paternal Grandfather      HOME MEDICATIONS: Allergies as of 04/17/2024       Reactions   Hyoscyamine Sulfate Hives  Phenobarbital Hives   Scopolamine Hives   Pb-hyoscy-atropine-scopolamine    Canagliflozin Rash   Latex Rash   Blistery rash   Phenobarbital-belladonna Alk Rash   Sulfamethoxazole Itching        Medication List        Accurate as of April 17, 2024  7:18 AM. If you have any questions, ask your nurse or doctor.          albuterol 108 (90 Base) MCG/ACT inhaler Commonly known as: VENTOLIN HFA Inhale 1-2 puffs into the lungs every 6 (six) hours as needed for wheezing or shortness of  breath.   amoxicillin-clavulanate 875-125 MG tablet Commonly known as: AUGMENTIN Take 1 tablet by mouth.   amphetamine-dextroamphetamine 20 MG tablet Commonly known as: ADDERALL Take 20 mg by mouth 2 (two) times daily.   artificial tears ophthalmic solution Place 1 drop into both eyes 2 (two) times daily as needed for dry eyes.   aspirin EC 81 MG tablet Take 81 mg by mouth daily.   Azelastine HCl 137 MCG/SPRAY Soln Place 1 spray into the nose.   buPROPion 150 MG 12 hr tablet Commonly known as: WELLBUTRIN SR Take 150 mg by mouth 2 (two) times daily.   buPROPion 300 MG 24 hr tablet Commonly known as: WELLBUTRIN XL Take 300 mg by mouth every morning.   diclofenac 75 MG EC tablet Commonly known as: VOLTAREN Take 75 mg by mouth.   DULoxetine 60 MG capsule Commonly known as: CYMBALTA Take 60 mg by mouth 2 (two) times daily.   fexofenadine 180 MG tablet Commonly known as: ALLEGRA Take 180 mg by mouth daily.   hydrOXYzine 25 MG tablet Commonly known as: ATARAX Take 25 mg by mouth 3 (three) times daily.   insulin  lispro 100 UNIT/ML KwikPen Commonly known as: Admelog  SoloStar Max daily 30 units   Insulin  Pen Needle 32G X 4 MM Misc 1 Device by Does not apply route in the morning, at noon, in the evening, and at bedtime.   Lantus  SoloStar 100 UNIT/ML Solostar Pen Generic drug: insulin  glargine Inject 20 Units into the skin daily.   lisinopril-hydrochlorothiazide 20-25 MG tablet Commonly known as: ZESTORETIC Take 2 tablets by mouth at bedtime.   lisinopril-hydrochlorothiazide 20-12.5 MG tablet Commonly known as: ZESTORETIC Take 2 tablets by mouth daily.   meclizine 25 MG tablet Commonly known as: ANTIVERT Take 25 mg by mouth every 6 (six) hours as needed.   omeprazole 40 MG capsule Commonly known as: PRILOSEC Take 80 mg by mouth.   Oxcarbazepine  300 MG tablet Commonly known as: Trileptal  Take 1 tablet (300 mg total) by mouth daily.   promethazine  6.25  MG/5ML solution Commonly known as: PHENERGAN  Take 25 mg by mouth.   rosuvastatin 20 MG tablet Commonly known as: CRESTOR Take 20 mg by mouth daily.   valACYclovir  500 MG tablet Commonly known as: VALTREX  Take 1 tablet (500 mg total) by mouth 3 (three) times daily.         ALLERGIES: Allergies  Allergen Reactions   Hyoscyamine Sulfate Hives   Phenobarbital Hives   Scopolamine Hives   Pb-Hyoscy-Atropine-Scopolamine    Canagliflozin Rash   Latex Rash    Blistery rash   Phenobarbital-Belladonna Alk Rash   Sulfamethoxazole Itching     REVIEW OF SYSTEMS: A comprehensive ROS was conducted with the patient and is negative except as per HPI   OBJECTIVE:   VITAL SIGNS: There were no vitals taken for this visit.   PHYSICAL EXAM:  General: Pt appears  well and is in NAD  Lungs: Clear with good BS bilat   Heart: RRR   Extremities:  Lower extremities - No pretibial edema  Neuro: MS is good with appropriate affect, pt is alert and Ox3   DM Foot Exam 07/20/2023 per podiatry    DATA REVIEWED:  Lab Results  Component Value Date   HGBA1C 7.9 (A) 12/01/2023   HGBA1C 10.8 (A) 08/30/2023   HGBA1C 9.1 (A) 06/08/2022   11/05/2023 @[facility]  Everywhere Sodium 136 Potassium 3.9 BUN 25 CR 1.17 GFR 55   10/17/2023 TSH 1.890   Old records , labs and images have been reviewed.    ASSESSMENT / PLAN / RECOMMENDATIONS:   1) Type 1 Diabetes Mellitus, Poorly  controlled, With Retinopathic, neuropathic and macrovascular complications - Most recent A1c of 9.8%. Goal A1c < 7.0 %.     -She does not like the Dexcom - She is currently on Lantus  and Admelog  through patient assistance program that was initiated through [facility] -Barriers to diabetes self-care is cognitive impairment -Metformin  was discontinued during hospitalization with gastrointestinal issues.  Will remain off   MEDICATIONS:   Continue Lantus  20 units daily  Increase Admelog  6 units with each  meal Continue correction factor: Admelog  (BG -130/50) TIDQAC  EDUCATION / INSTRUCTIONS: BG monitoring instructions: Patient is instructed to check her blood sugars 3 times a day, before meals . Call [facility] if: BG persistently < 70  I reviewed the Rule of 15 for the treatment of hypoglycemia in detail with the patient. Literature supplied.   2) Diabetic complications:  Eye: Does  have known diabetic retinopathy.  Neuro/ Feet: Does  have known diabetic peripheral neuropathy. Renal: Patient does not have known baseline CKD. She is  on an ACEI/ARB at present.   3) Dyslipidemia :   - Historically LDL acceptable in the past, we will continue to monitor Pt on rosuvastatin 20 mg daily   Follow-up in 4 months    Signed electronically by: Stefano Redgie Butts, MD  [facility]  [facility] [address] [place], [place] [address] Phone: [phone] FAX: [phone]   CC: Lizette Macario BRAVO, NP (Inactive) [address] [place] [address] Phone: [phone]  Fax: [phone]    Return to Endocrinology clinic as below: Future Appointments  Date Time Provider Department Center  04/17/2024  9:50 AM Ashanti Littles, Donell Redgie, MD [id number] None    "

## 2024-04-17 NOTE — Patient Instructions (Addendum)
 Increase  Lantus  22 units ONCE daily Take Admelog  8 units with each meal Admelog  correctional insulin : ADD extra units on insulin  to your meal-time Novolin -R  dose if your blood sugars are higher than 180. Use the scale below to help guide you BEFORE each meal   Blood sugar before meal Number of units to inject  Less than 180 0 unit  180 -  230 1 units  231 -  280 2 units  281 -  330 3 units  331 -  380 4 units  381 -  430 5 units  431 -  480 6 units  481 -  530 7 units      HOW TO TREAT LOW BLOOD SUGARS (Blood sugar LESS THAN 70 MG/DL) Please follow the RULE OF 15 for the treatment of hypoglycemia treatment (when your (blood sugars are less than 70 mg/dL)   STEP 1: Take 15 grams of carbohydrates when your blood sugar is low, which includes:  3-4 GLUCOSE TABS  OR 3-4 OZ OF JUICE OR REGULAR SODA OR ONE TUBE OF GLUCOSE GEL    STEP 2: RECHECK blood sugar in 15 MINUTES STEP 3: If your blood sugar is still low at the 15 minute recheck --> then, go back to STEP 1 and treat AGAIN with another 15 grams of carbohydrates.

## 2024-04-17 NOTE — Telephone Encounter (Signed)
 Can you please order tandem t:slim for the patient?   Thank you

## 2024-04-24 ENCOUNTER — Telehealth: Payer: Self-pay

## 2024-04-24 ENCOUNTER — Other Ambulatory Visit: Payer: Self-pay | Admitting: Internal Medicine

## 2024-04-24 DIAGNOSIS — E1065 Type 1 diabetes mellitus with hyperglycemia: Secondary | ICD-10-CM

## 2024-04-24 NOTE — Telephone Encounter (Signed)
 Tandem need  BOTH Fasting blood glucose and C-peptide lab results - MUST BE DRAWN THE SAME DAY FASTING LABS MUST INCLUDE:  ----Reference range ----Signature of Doctor, dated and notated DRAWN FASTING/CONCURRENT or Autoantibody Labs  Or a positive autoantibody lab for any of the following                               ? Islet Cell Cytoplasmic Autoantibodies (ICA)                               ? Insulin  Autoantibodies (IAA)                               ? Glutamic Acid Decarboxylase Auto Antibodies (GADA)                               ? GAD65 Autoantibodies                               ? ICA512 Autoantibodies                                ? Insulinoma-Associated-2 Autoantibodies (IA-2A)  Please advise

## 2024-04-24 NOTE — Telephone Encounter (Signed)
 Left message for patient to schedule appointment for blood work to get insulin  pump approved.

## 2024-04-25 NOTE — Telephone Encounter (Signed)
 Left message for patient to call back to discuss labs and appointment.

## 2024-08-15 ENCOUNTER — Ambulatory Visit: Admitting: Internal Medicine
